# Patient Record
Sex: Female | Born: 1950
Health system: Southern US, Community
[De-identification: ages and names within clinical notes are randomized; demographics above are authoritative.]

## PROBLEM LIST (undated history)

## (undated) DIAGNOSIS — I428 Other cardiomyopathies: Secondary | ICD-10-CM

## (undated) DIAGNOSIS — I10 Essential (primary) hypertension: Secondary | ICD-10-CM

## (undated) DIAGNOSIS — I509 Heart failure, unspecified: Secondary | ICD-10-CM

## (undated) DIAGNOSIS — G8929 Other chronic pain: Secondary | ICD-10-CM

## (undated) DIAGNOSIS — R0602 Shortness of breath: Secondary | ICD-10-CM

## (undated) DIAGNOSIS — M1990 Unspecified osteoarthritis, unspecified site: Secondary | ICD-10-CM

## (undated) DIAGNOSIS — M549 Dorsalgia, unspecified: Secondary | ICD-10-CM

## (undated) DIAGNOSIS — M2550 Pain in unspecified joint: Secondary | ICD-10-CM

## (undated) DIAGNOSIS — E785 Hyperlipidemia, unspecified: Secondary | ICD-10-CM

## (undated) DIAGNOSIS — M2540 Effusion, unspecified joint: Secondary | ICD-10-CM

## (undated) DIAGNOSIS — E119 Type 2 diabetes mellitus without complications: Secondary | ICD-10-CM

## (undated) DIAGNOSIS — E876 Hypokalemia: Secondary | ICD-10-CM

## (undated) HISTORY — PX: OTHER SURGICAL HISTORY: SHX169

## (undated) HISTORY — DX: Other cardiomyopathies: I42.8

## (undated) HISTORY — PX: CHOLECYSTECTOMY: SHX55

## (undated) HISTORY — PX: JOINT REPLACEMENT: SHX530

---

## 2008-09-15 ENCOUNTER — Inpatient Hospital Stay (HOSPITAL_COMMUNITY): Admission: RE | Admit: 2008-09-15 | Discharge: 2008-09-18 | Payer: Self-pay | Admitting: Orthopedic Surgery

## 2008-10-03 ENCOUNTER — Encounter: Admission: RE | Admit: 2008-10-03 | Discharge: 2009-01-01 | Payer: Self-pay | Admitting: Orthopedic Surgery

## 2010-12-17 LAB — URINALYSIS, ROUTINE W REFLEX MICROSCOPIC
Bilirubin Urine: NEGATIVE
Glucose, UA: NEGATIVE mg/dL
Ketones, ur: NEGATIVE mg/dL
Nitrite: NEGATIVE
Specific Gravity, Urine: 1.018 (ref 1.005–1.030)
pH: 5.5 (ref 5.0–8.0)

## 2010-12-17 LAB — DIFFERENTIAL
Basophils Absolute: 0 10*3/uL (ref 0.0–0.1)
Basophils Relative: 0 % (ref 0–1)
Eosinophils Absolute: 0.1 10*3/uL (ref 0.0–0.7)
Eosinophils Relative: 1 % (ref 0–5)
Monocytes Absolute: 0.7 10*3/uL (ref 0.1–1.0)
Monocytes Relative: 8 % (ref 3–12)
Neutro Abs: 4.7 10*3/uL (ref 1.7–7.7)

## 2010-12-17 LAB — CBC
HCT: 30.8 % — ABNORMAL LOW (ref 36.0–46.0)
HCT: 41 % (ref 36.0–46.0)
HCT: 25 % — ABNORMAL LOW (ref 36.0–46.0)
HCT: 29.5 % — ABNORMAL LOW (ref 36.0–46.0)
Hemoglobin: 13.8 g/dL (ref 12.0–15.0)
MCHC: 33.2 g/dL (ref 30.0–36.0)
MCV: 88.8 fL (ref 78.0–100.0)
MCV: 89.1 fL (ref 78.0–100.0)
MCV: 90 fL (ref 78.0–100.0)
Platelets: 197 10*3/uL (ref 150–400)
Platelets: 216 10*3/uL (ref 150–400)
Platelets: 176 10*3/uL (ref 150–400)
Platelets: 190 10*3/uL (ref 150–400)
RBC: 3.47 MIL/uL — ABNORMAL LOW (ref 3.87–5.11)
RBC: 3.27 MIL/uL — ABNORMAL LOW (ref 3.87–5.11)
RDW: 14.1 % (ref 11.5–15.5)
WBC: 12.3 10*3/uL — ABNORMAL HIGH (ref 4.0–10.5)
WBC: 8.2 10*3/uL (ref 4.0–10.5)
WBC: 12 10*3/uL — ABNORMAL HIGH (ref 4.0–10.5)

## 2010-12-17 LAB — COMPREHENSIVE METABOLIC PANEL
ALT: 30 U/L (ref 0–35)
Albumin: 4 g/dL (ref 3.5–5.2)
Alkaline Phosphatase: 101 U/L (ref 39–117)
BUN: 16 mg/dL (ref 6–23)
Chloride: 107 mEq/L (ref 96–112)
Potassium: 3.7 mEq/L (ref 3.5–5.1)
Sodium: 143 mEq/L (ref 135–145)
Total Bilirubin: 0.5 mg/dL (ref 0.3–1.2)
Total Protein: 7.5 g/dL (ref 6.0–8.3)

## 2010-12-17 LAB — PREGNANCY, URINE: Preg Test, Ur: NEGATIVE

## 2010-12-17 LAB — BASIC METABOLIC PANEL
BUN: 12 mg/dL (ref 6–23)
BUN: 15 mg/dL (ref 6–23)
CO2: 27 mEq/L (ref 19–32)
Chloride: 104 mEq/L (ref 96–112)
Chloride: 105 mEq/L (ref 96–112)
GFR calc Af Amer: >60 mL/min (ref >60)
GFR calc non Af Amer: >60 mL/min (ref >60)
Potassium: 4 mEq/L (ref 3.5–5.1)
Potassium: 3.7 mEq/L (ref 3.5–5.1)
Sodium: 138 mEq/L (ref 135–145)

## 2010-12-17 LAB — URINE MICROSCOPIC-ADD ON

## 2010-12-17 LAB — CROSSMATCH

## 2010-12-17 LAB — APTT: aPTT: 33 seconds (ref 24–37)

## 2010-12-17 LAB — ABO/RH: ABO/RH(D): O POS

## 2011-01-15 NOTE — Discharge Summary (Signed)
Sharon [name], Sharon [name]                ACCOUNT NO.:  [id number]   MEDICAL RECORD NO.:  [id number]          PATIENT TYPE:  INP   LOCATION:  1607                         FACILITY:  [facility]   PHYSICIAN:  John L. Rendall, M.D.  DATE OF BIRTH:  Dec 31, 1950   DATE OF ADMISSION:  09/15/2008  DATE OF DISCHARGE:  09/18/2008                               DISCHARGE SUMMARY   ADMISSION DIAGNOSES:  1. End-stage osteoarthritis left knee.  2. Obesity.  3. Hypertension.  4. Osteoporosis.  5. Chronic bronchitis.   DISCHARGE DIAGNOSES:  1. End-stage osteoarthritis left knee status post left total knee      arthroplasty.  2. Acute blood loss anemia secondary to surgery.  3. Hyperglycemia.  4. Obesity.  5. Hypertension.  6. Osteoporosis.  7. Chronic bronchitis.   SURGICAL PROCEDURES:  On September 15, 2008 Sharon [name] underwent a left  total knee arthroplasty with computer navigation by Dr. Jonny Ruiz L.  Rendall  assisted by Arnoldo Morale PA-C.  She had a DePuy NBT keel tibial tray  cemented size three placed with an LPS complete primary femoral  component standard plus left.  An LPS complete RP insert size standard  plus 10-mm thickness and LPS complete metal back patella size standard  plus.   COMPLICATIONS:  None.   CONSULTANT:  1. Physical therapy consult September 16, 2008.  2. Occupational therapy consult September 17, 2008.  3. Case management consult September 18, 2008.   HISTORY OF PRESENT ILLNESS:  This 60 year old black female patient  presented to Dr. Priscille Kluver with a 5-10-year history of gradual onset  progressive left knee pain over the last year.  She does have a history  two knee scopes in the past, but the pain now is a constant sharp  jabbing ache over the anterior joint line with radiation into the thigh.  It increases with range of motion and walking and decreases with rest.  The knee pops, grinds and gives.  She has failed conservative treatment  and x-rays show end-stage arthritic changes  of the knee.  Because of  this, she is presenting for left knee replacement.   HOSPITAL COURSE:  Sharon [name] tolerated her surgical procedure well  without immediate postoperative complications.  She was transferred to  the orthopedic floor.  Postop day #1, T-max 98.6, BP a bit low at 97/64.  Hemoglobin was 10.1, hematocrit 30.8, white count elevated at 12.3.  Dressing was intact to the left leg with a Hemovac in place.  Pain was  controlled with medications.  She was weaned off her oxygen and started  on therapy per protocol.   Postop day #2.  T-max 98.4, BP improved to 116/75.  White count 12,  hemoglobin 9.8, hematocrit 29.5.  Dressing was changed.  She was  switched to p.o. pain medications and continued on therapy.   Postop day #3, she was feeling better.  Hemoglobin had dropped to 8.3  with hematocrit of 25 but she was asymptomatic.  White count dropped to  9.9, BP was 127/65, pulse was a bit elevated at 110.  Leg was  neurovascularly intact.  She was doing well with therapy and it was felt  she was ready for discharge home and she was discharged home at that  time.   DISCHARGE INSTRUCTIONS:  1. Diet:  She is to resume her regular prehospitalization diet.  2. Activity:  She can be out of bed, weightbearing as tolerated on the      left leg with use of walker.  She is to increase activity slowly.      No lifting or driving for 6 weeks.  She is to have home CPM zero to      90 degrees 6-8 hours a day and home health PT per [facility].  Please see the blue total knee discharge sheet for      further activity instructions.  3. Wound care:  She may shower after no drainage from the wound for 2      days.  Please see the blue total knee discharge sheet for further      wound care instructions.  4. Follow-up:  She is to follow up with Dr. Priscille Kluver in our office on      Tuesday January 26 and needs to call [phone] for that appointment.   MEDICATIONS:  She is to  resume her home medications as follows.  1. Estradiol 0.5 mg p.o. q.a.m.  2. Ramipril 10 mg p.o. q.a.m.  3. Triamterene/hydrochlorothiazide 37.5/25 mg p.o. q.a.m.  4. Diltiazem CD 300 mg p.o. q.a.m.  5. Fosamax 70 mg p.o. q. Monday.  6. Hydrocodone, she is to hold at this time while she is on other pain      medications.  7. Vitamin D 1000 units p.o. q.a.m.  8. Vitamin C 500 mg p.o. b.i.d.  9. Beta-carotene 15 mg p.o. q.a.m.  10.Ibuprofen p.m.  She is to hold at this time while on blood thinner.  11.Vitamin E one p.o. q.a.m.  12.ProAir inhaler two puffs p.r.n.   ADDITIONAL MEDICATIONS AT THIS TIME:  1. Arixtra 2.5 mg subcu q. 8 p.m. with last dose on January 23.  On      January 24, she is to start one baby aspirin a day for one month.  2. Celebrex 200 mg one tablet p.o. b.i.d. for one week and then one      tablet p.o. daily.  Samples were given.  3. Percocet 5/325 1-2 tablets p.o. q.4 h p.r.n. for pain, 60 with no      refill.  4. Robaxin 500 mg 1-2 tablet p.o. q.6 h p.r.n. for spasms 60 with no      refill.   LABORATORY DATA:  Hemoglobin/hematocrit ranged from 13.8 an 41 on the  11th to 8.3 and 25 on the 17th.  White count went from 8.2 on the 11th  to 12.3 on the 15th to 9.9 on the 17th.  Platelets were within normal  limits.   Glucose ranged from 91 on the 11th to 146 on the 16th.  Calcium ranged  from 9.4 on the 11th to 7.8 on the 16th.   Urinalysis on the 11th showed trace hemoglobin, few epithelials, 0-2 red  cells.  All other laboratory studies were within normal limits.      Legrand Pitts Duffy, P.A.      John L. Rendall, M.D.  Electronically Signed    KED/MEDQ  D:  10/20/2008  T:  10/20/2008  Job:  [id number]

## 2011-01-15 NOTE — Op Note (Signed)
[name], [name]                ACCOUNT NO.:  [id number]   MEDICAL RECORD NO.:  [id number]          PATIENT TYPE:  INP   LOCATION:  0002                         FACILITY:  [facility]   PHYSICIAN:  John L. Rendall, M.D.  DATE OF BIRTH:  15-Jun-1951   DATE OF PROCEDURE:  09/15/2008  DATE OF DISCHARGE:                               OPERATIVE REPORT   PREOPERATIVE DIAGNOSIS:  Osteoarthritis, left knee.   SURGICAL PROCEDURES:  Left LCS total knee replacement with computer  navigation assistance.   POSTOPERATIVE DIAGNOSIS:  Osteoarthritis, left knee.   SURGEON:  John L. Rendall, M.D.   ASSISTANT:  Legrand Pitts. Duffy, P.A.   ANESTHESIA:  General with a femoral nerve block.   PATHOLOGY:  Bone against bone, left knee with unremitting pain despite  conservative measures.  Besides tricompartmental arthritis,  there is  found to be about a 5 cm x 5 cm x 3-4 cm lipoma and the suprapatellar  pouch.  It blocks flexion of the knee and upon its removal, the knee  bent past 90 degrees easily but up until then, it would not bend past 90  degrees.   PROCEDURE:  Under general anesthesia the left leg was prepared with  DuraPrep and draped as a sterile field.  A sterile tourniquet was placed  in the upper thigh.  Leg was wrapped out with the Esmarch and a  tourniquet was used at 350 mm.  Midline incision was made.  The patella  was everted.  The femur was sized at a standard plus.  Debridement was  done in preparation for computer mapping.  Once this was completed,  Schanz pins were placed in the superior medial tibia through accessory  stab wounds and in the distal medial femur.  With these in place the  arrays were set up, the femoral head was identified, medial and lateral  malleoli are identified and proximal and distal tibia and femur  respectively are mapped.  Once this was completed within 1 mm of  reproducible accuracy, the first guide is used for proximal tibial  resection.  This was done within  1 degree of anatomic alignment.  The  tensioner is then used and tensioning is done to within 2 degrees of  valgus.  The knee started off in about 4-5 degrees of valgus.  The  flexion gap was then measured.  The anterior and posterior flare of the  distal femur are then resected for a standard plus.  The distal femoral  cut was likewise made.  Flexion and extension gaps were balanced at 10  degrees and the tightness laterally is released better what with further  debridement.  Recessing guide is then used.  Proximal tibia was exposed.  It sized to a #3. Center peg hole with keel was used and with this in  place, a standard plus 10 bearing is inserted and the standard plus  femoral component.  With these in place and properly aligned, the knee  is within 1-1/2 degree of anatomic alignment.  It is within 1 degree of  full extension where as it had been 10  degrees flexed.  In debriding the  knee, a large lipoma was removed from the suprapatellar pouch.  With it  gone, the knee would now flex to about 105 degrees relatively easily.  At this point permanent components were obtained and the arrays were  taken down. Bony surfaces were prepared with pulse irrigation.  All  components were cemented in place.  Tourniquet is let down at an hour  and 10 minutes after excess cement was removed.  The knee was then  closed in layers with #1 Tycron, #1 Vicryl, 2-0 Vicryl and skin clips.  Total operative time about an hour and 30 minutes.  The patient  tolerated the procedure well and returned to recovery in good condition.  Blood loss less than 100.      John L. Rendall, M.D.  Electronically Signed     JLR/MEDQ  D:  09/15/2008  T:  09/15/2008  Job:  [id number]

## 2011-10-30 ENCOUNTER — Encounter (HOSPITAL_BASED_OUTPATIENT_CLINIC_OR_DEPARTMENT_OTHER): Payer: Self-pay | Admitting: Family Medicine

## 2011-10-30 ENCOUNTER — Emergency Department (HOSPITAL_BASED_OUTPATIENT_CLINIC_OR_DEPARTMENT_OTHER)
Admission: EM | Admit: 2011-10-30 | Discharge: 2011-10-30 | Disposition: A | Discharge status: Home / Self Care | Payer: BC Managed Care – PPO | Attending: Emergency Medicine | Admitting: Emergency Medicine

## 2011-10-30 DIAGNOSIS — I10 Essential (primary) hypertension: Secondary | ICD-10-CM | POA: Insufficient documentation

## 2011-10-30 DIAGNOSIS — L509 Urticaria, unspecified: Secondary | ICD-10-CM | POA: Insufficient documentation

## 2011-10-30 HISTORY — DX: Essential (primary) hypertension: I10

## 2011-10-30 MED ORDER — PREDNISONE 50 MG PO TABS: ORAL_TABLET | ORAL | Status: AC

## 2011-10-30 MED ORDER — EPINEPHRINE 0.3 MG/0.3ML IJ DEVI: 0.3000 mg | INTRAMUSCULAR | Status: DC | PRN

## 2011-10-30 MED ORDER — DEXAMETHASONE SODIUM PHOSPHATE 10 MG/ML IJ SOLN
10.0000 mg | Freq: Once | INTRAMUSCULAR | Status: AC
  Administered 2011-10-30: 10 mg via INTRAMUSCULAR
  Filled 2011-10-30: qty 1

## 2011-10-30 NOTE — ED Notes (Signed)
Pt c/o urticaria to neck, upper arms, face and back since yesterday. Pt reports eating and handling a "new fruit and vegetable at school yesterday".

## 2011-10-30 NOTE — ED Provider Notes (Signed)
History     CSN: [id number]  Arrival date & time 10/30/11  1533   First MD Initiated Contact with Patient 10/30/11 1615      Chief Complaint  Patient presents with  . Rash     Patient is a 61 y.o. female presenting with rash. The history is provided by the patient.  Rash  This is a new problem. The current episode started yesterday. The problem has been gradually worsening. Associated with: fruit. There has been no fever. Affected Location: back/extremities. The pain is moderate. The pain has been constant since onset. Associated symptoms include itching. The treatment provided mild relief.  pt denies shortness of breath No facial/tongue swelling  She has never had allergic reactions previously  Past Medical History  Diagnosis Date  . Hypertension   . Bronchitis     Past Surgical History  Procedure Date  . Joint replacement   . Carpel tunnel   . Cholecystectomy     No family history on file.  History  Substance Use Topics  . Smoking status: Never Smoker   . Smokeless tobacco: Not on file  . Alcohol Use: No    OB History    Grav Para Term Preterm Abortions TAB SAB Ect Mult Living                  Review of Systems  Constitutional: Negative for fever.  Skin: Positive for itching and rash.    Allergies  Review of patient's allergies indicates not on file.  Home Medications   Current Outpatient Rx  Name Route Sig Dispense Refill  . ALENDRONATE SODIUM 70 MG PO TABS Oral Take 70 mg by mouth every 7 (seven) days. Take with a full glass of water on an empty stomach.    Marland Kitchen DILTIAZEM HCL ER COATED BEADS 300 MG PO CP24 Oral Take 300 mg by mouth daily.    Marland Kitchen DIPHENHYDRAMINE HCL (SLEEP) 25 MG PO TABS Oral Take 25 mg by mouth as needed. For allergic reaction    . METOPROLOL SUCCINATE ER 50 MG PO TB24 Oral Take 25 mg by mouth 2 (two) times daily. Take with or immediately following a meal.    . ADULT MULTIVITAMIN W/MINERALS CH Oral Take 1 tablet by mouth daily.    Marland Kitchen  RAMIPRIL 10 MG PO CAPS Oral Take 10 mg by mouth daily.    Marland Kitchen ROSUVASTATIN CALCIUM 40 MG PO TABS Oral Take 40 mg by mouth daily.    . TRIAMTERENE-HCTZ 37.5-25 MG PO TABS Oral Take 1 tablet by mouth daily.    Marland Kitchen EPINEPHRINE 0.3 MG/0.3ML IJ DEVI Intramuscular Inject 0.3 mLs (0.3 mg total) into the muscle as needed. 2 Device 0  . PREDNISONE 50 MG PO TABS  One tablet PO daily 5 tablet 0    BP 171/96  Pulse 88  Temp(Src) 98.6 F (37 C) (Oral)  Resp 16  Ht 5\' 7"  (1.702 m)  Wt 260 lb (117.935 kg)  BMI 40.72 kg/m2  SpO2 100%  Physical Exam CONSTITUTIONAL: Well developed/well nourished HEAD AND FACE: Normocephalic/atraumatic EYES: EOMI/PERRL ENMT: Mucous membranes moist, no angioedema noted NECK: supple no meningeal signs SPINE:entire spine nontender CV: S1/S2 noted, no murmurs/rubs/gallops noted LUNGS: Lungs are clear to auscultation bilaterally, no apparent distress ABDOMEN: soft, nontender, no rebound or guarding NEURO: Pt is awake/alert, moves all extremitiesx4 EXTREMITIES: pulses normal, full ROM SKIN: warm, urticaria noted to back/extremities PSYCH: no abnormalities of mood noted  ED Course  Procedures    1. Urticaria  Pt well appearing, no distress This has been present since yesterday Will start on steroids.  Also, I did prescribe an epipen for this patient and advised on use if symptoms return   MDM  Nursing notes reviewed and considered in documentation         Joya Gaskins, MD 10/30/11 1736

## 2011-10-30 NOTE — Discharge Instructions (Signed)

## 2013-02-05 ENCOUNTER — Encounter (HOSPITAL_COMMUNITY): Payer: Self-pay | Admitting: Pharmacy Technician

## 2013-02-06 DIAGNOSIS — M79609 Pain in unspecified limb: Secondary | ICD-10-CM | POA: Insufficient documentation

## 2013-02-06 DIAGNOSIS — M545 Low back pain, unspecified: Secondary | ICD-10-CM | POA: Insufficient documentation

## 2013-02-06 DIAGNOSIS — M810 Age-related osteoporosis without current pathological fracture: Secondary | ICD-10-CM | POA: Insufficient documentation

## 2013-02-06 DIAGNOSIS — M48061 Spinal stenosis, lumbar region without neurogenic claudication: Secondary | ICD-10-CM | POA: Insufficient documentation

## 2013-02-06 DIAGNOSIS — E7800 Pure hypercholesterolemia, unspecified: Secondary | ICD-10-CM | POA: Insufficient documentation

## 2013-02-06 DIAGNOSIS — N951 Menopausal and female climacteric states: Secondary | ICD-10-CM | POA: Insufficient documentation

## 2013-02-06 DIAGNOSIS — E669 Obesity, unspecified: Secondary | ICD-10-CM | POA: Insufficient documentation

## 2013-02-06 DIAGNOSIS — E6609 Other obesity due to excess calories: Secondary | ICD-10-CM | POA: Insufficient documentation

## 2013-02-06 DIAGNOSIS — M5430 Sciatica, unspecified side: Secondary | ICD-10-CM | POA: Insufficient documentation

## 2013-02-10 ENCOUNTER — Ambulatory Visit (HOSPITAL_COMMUNITY)
Admission: RE | Admit: 2013-02-10 | Discharge: 2013-02-10 | Disposition: A | Discharge status: Home / Self Care | Payer: BC Managed Care – PPO | Source: Ambulatory Visit | Attending: Physician Assistant | Admitting: Physician Assistant

## 2013-02-10 ENCOUNTER — Other Ambulatory Visit: Payer: Self-pay | Admitting: Physician Assistant

## 2013-02-10 ENCOUNTER — Encounter (HOSPITAL_COMMUNITY)
Admission: RE | Admit: 2013-02-10 | Discharge: 2013-02-10 | Disposition: A | Discharge status: Home / Self Care | Payer: BC Managed Care – PPO | Source: Ambulatory Visit | Attending: Orthopedic Surgery | Admitting: Orthopedic Surgery

## 2013-02-10 ENCOUNTER — Encounter (HOSPITAL_COMMUNITY): Payer: Self-pay

## 2013-02-10 DIAGNOSIS — Z01818 Encounter for other preprocedural examination: Secondary | ICD-10-CM | POA: Insufficient documentation

## 2013-02-10 DIAGNOSIS — M1710 Unilateral primary osteoarthritis, unspecified knee: Secondary | ICD-10-CM | POA: Insufficient documentation

## 2013-02-10 DIAGNOSIS — Z01812 Encounter for preprocedural laboratory examination: Secondary | ICD-10-CM | POA: Insufficient documentation

## 2013-02-10 DIAGNOSIS — IMO0002 Reserved for concepts with insufficient information to code with codable children: Secondary | ICD-10-CM | POA: Insufficient documentation

## 2013-02-10 HISTORY — DX: Pain in unspecified joint: M25.50

## 2013-02-10 HISTORY — DX: Unspecified osteoarthritis, unspecified site: M19.90

## 2013-02-10 HISTORY — DX: Hypokalemia: E87.6

## 2013-02-10 HISTORY — DX: Effusion, unspecified joint: M25.40

## 2013-02-10 HISTORY — DX: Hyperlipidemia, unspecified: E78.5

## 2013-02-10 HISTORY — DX: Shortness of breath: R06.02

## 2013-02-10 HISTORY — DX: Dorsalgia, unspecified: M54.9

## 2013-02-10 LAB — CBC WITH DIFFERENTIAL/PLATELET
Basophils Absolute: 0 10*3/uL (ref 0.0–0.1)
Eosinophils Absolute: 0.2 10*3/uL (ref 0.0–0.7)
Eosinophils Relative: 2 % (ref 0–5)
Lymphocytes Relative: 35 % (ref 12–46)
Lymphs Abs: 3.2 10*3/uL (ref 0.7–4.0)
Neutrophils Relative %: 56 % (ref 43–77)
Platelets: 206 10*3/uL (ref 150–400)
RBC: 4.92 MIL/uL (ref 3.87–5.11)
RDW: 14.1 % (ref 11.5–15.5)
WBC: 9.2 10*3/uL (ref 4.0–10.5)

## 2013-02-10 LAB — URINALYSIS, ROUTINE W REFLEX MICROSCOPIC
Leukocytes, UA: NEGATIVE
Nitrite: NEGATIVE
Protein, ur: 30 mg/dL — AB
Specific Gravity, Urine: 1.017 (ref 1.005–1.030)
Urobilinogen, UA: 0.2 mg/dL (ref 0.0–1.0)

## 2013-02-10 LAB — COMPREHENSIVE METABOLIC PANEL
ALT: 22 U/L (ref 0–35)
AST: 16 U/L (ref 0–37)
Alkaline Phosphatase: 92 U/L (ref 39–117)
CO2: 28 mEq/L (ref 19–32)
Calcium: 9.3 mg/dL (ref 8.4–10.5)
GFR calc Af Amer: 72 mL/min — ABNORMAL LOW (ref >90)
Glucose, Bld: 97 mg/dL (ref 70–99)
Potassium: 3.8 mEq/L (ref 3.5–5.1)
Sodium: 143 mEq/L (ref 135–145)
Total Protein: 8.1 g/dL (ref 6.0–8.3)

## 2013-02-10 LAB — URINE MICROSCOPIC-ADD ON

## 2013-02-10 LAB — SURGICAL PCR SCREEN: Staphylococcus aureus: NEGATIVE

## 2013-02-10 LAB — APTT: aPTT: 33 seconds (ref 24–37)

## 2013-02-10 LAB — TYPE AND SCREEN
ABO/RH(D): O POS
Antibody Screen: NEGATIVE

## 2013-02-10 MED ORDER — CHLORHEXIDINE GLUCONATE 4 % EX LIQD: 60.0000 mL | Freq: Once | CUTANEOUS | Status: DC

## 2013-02-10 NOTE — Progress Notes (Signed)
Pt doesn't have a cardiologist  Echo was done about 2 yrs ago with Dr.Chun with [facility] request report  Stress test was done about 89yrs ago and to request report  Denies ever having a heart cath  Dr.William Ammen in [place] is Medical Md  EKG report in chart from 01-18-13  Denies CXR in past yr  Clearance note in chart

## 2013-02-10 NOTE — Pre-Procedure Instructions (Signed)
Sharon Yu  02/10/2013   Your procedure is scheduled on:  Fri, June 20 @ 7:30 AM  Report to [facility] at 5:30 AM.  Call this number if you have problems the morning of surgery: [phone]   Remember:   Do not eat food or drink liquids after midnight.   Take these medicines the morning of surgery with A SIP OF WATER: Metoprolol(Lopressor)   Do not wear jewelry, make-up or nail polish.  Do not wear lotions, powders, or perfumes. You may wear deodorant.  Do not shave 48 hours prior to surgery.   Do not bring valuables to the hospital.  [facility] is not responsible                   for any belongings or valuables.  Contacts, dentures or bridgework may not be worn into surgery.  Leave suitcase in the car. After surgery it may be brought to your room.  For patients admitted to the hospital, checkout time is 11:00 AM the day of  discharge.   Patients discharged the day of surgery will not be allowed to drive  home.    Special Instructions: Shower using CHG 2 nights before surgery and the night before surgery.  If you shower the day of surgery use CHG.  Use special wash - you have one bottle of CHG for all showers.  You should use approximately 1/3 of the bottle for each shower.   Please read over the following fact sheets that you were given: Pain Booklet, Coughing and Deep Breathing, Blood Transfusion Information, Total Joint Packet, MRSA Information and Surgical Site Infection Prevention

## 2013-02-11 LAB — URINE CULTURE

## 2013-02-15 NOTE — Progress Notes (Signed)
Requested last OV, Stress, echo from [facility]

## 2013-02-18 MED ORDER — CEFAZOLIN SODIUM-DEXTROSE 2-3 GM-% IV SOLR
2.0000 g | INTRAVENOUS | Status: AC
  Administered 2013-02-19: 2 g via INTRAVENOUS
  Filled 2013-02-18: qty 50

## 2013-02-18 NOTE — H&P (Signed)
TOTAL KNEE ADMISSION H&P  Patient is being admitted for right total knee arthroplasty.  Subjective:  Chief Complaint:right knee pain.  HPI: Sharon Yu, 62 y.o. female, has a history of pain and functional disability in the right knee due to arthritis and has failed non-surgical conservative treatments for greater than 12 weeks to includeNSAID's and/or analgesics, corticosteriod injections, use of assistive devices and activity modification.  Onset of symptoms was gradual, starting >10 years ago with gradually worsening course since that time. The patient noted prior procedures on the knee to include  arthroscopy and menisectomy on the right knee(s).  Patient currently rates pain in the right knee(s) at 8 out of 10 with activity. Patient has night pain, worsening of pain with activity and weight bearing, pain that interferes with activities of daily living, pain with passive range of motion, crepitus and joint swelling.  Patient has evidence of periarticular osteophytes and joint space narrowing by imaging studies.There is no active infection.  There are no active problems to display for this patient.  Past Medical History  Diagnosis Date  . Hypertension     takes Maxzide and Metoprolol daily along with Ramipril  . Hyperlipidemia     takes Crestor daily  . Shortness of breath     with exertion  . Arthritis   . Joint pain   . Joint swelling   . Back pain     buldging disc  . Hypopotassemia     takes Potassium pill daily    Past Surgical History  Procedure Laterality Date  . Carpel tunnel      bilateral  . Cholecystectomy    . Bilateral knee arthroscopies    . Joint replacement Left   . Colonscopy       (Not in a hospital admission) Allergies  Allergen Reactions  . Other Hives and Swelling    "Grapple" (hybrid apple & grape)    History  Substance Use Topics  . Smoking status: Never Smoker   . Smokeless tobacco: Not on file  . Alcohol Use: No    No family history  on file.   Review of Systems  Constitutional: Negative.   HENT: Negative.   Eyes: Negative.   Respiratory: Positive for shortness of breath. Negative for cough, hemoptysis, sputum production and wheezing.   Cardiovascular: Positive for leg swelling. Negative for chest pain, palpitations, orthopnea and claudication.  Gastrointestinal: Negative.   Genitourinary: Negative.   Musculoskeletal: Positive for joint pain. Negative for falls.  Skin: Negative.   Neurological: Negative.   Endo/Heme/Allergies: Negative.   Psychiatric/Behavioral: Negative.     Objective:  Physical Exam  Constitutional: She is oriented to person, place, and time. She appears well-developed and well-nourished. No distress.  HENT:  Head: Normocephalic and atraumatic.  Nose: Nose normal.  Eyes: Conjunctivae and EOM are normal. Pupils are equal, round, and reactive to light.  Neck: Normal range of motion. Neck supple.  Cardiovascular: Normal rate, regular rhythm, normal heart sounds and intact distal pulses.   No murmur heard. Respiratory: Effort normal and breath sounds normal. No respiratory distress. She has no wheezes. She exhibits no tenderness.  GI: Soft. Bowel sounds are normal. She exhibits no distension. There is no tenderness.  Musculoskeletal:       Right knee: She exhibits decreased range of motion, swelling, effusion and deformity. She exhibits no ecchymosis, no erythema, no LCL laxity and no MCL laxity. Tenderness found.  Lymphadenopathy:    She has no cervical adenopathy.  Neurological: She is  alert and oriented to person, place, and time. No cranial nerve deficit.  Skin: Skin is warm and dry. No rash noted. No erythema. No pallor.  Psychiatric: She has a normal mood and affect. Her behavior is normal.    Vital signs in last 24 hours: @VSRANGES @  Labs:   Estimated body mass index is 40.38 kg/(m^2) as calculated from the following:   Height as of 10/30/11: 5\' 7"  (1.702 m).   Weight as of an  earlier encounter on 02/10/13: 116.983 kg (257 lb 14.4 oz).   Imaging Review Plain radiographs demonstrate severe degenerative joint disease of the right knee(s). The overall alignment issignificant varus. The bone quality appears to be good for age and reported activity level.  Assessment/Plan:  End stage arthritis, right knee   The patient history, physical examination, clinical judgment of the provider and imaging studies are consistent with end stage degenerative joint disease of the right knee(s) and total knee arthroplasty is deemed medically necessary. The treatment options including medical management, injection therapy arthroscopy and arthroplasty were discussed at length. The risks and benefits of total knee arthroplasty were presented and reviewed. The risks due to aseptic loosening, infection, stiffness, patella tracking problems, thromboembolic complications and other imponderables were discussed. The patient acknowledged the explanation, agreed to proceed with the plan and consent was signed. Patient is being admitted for inpatient treatment for surgery, pain control, PT, OT, prophylactic antibiotics, VTE prophylaxis, progressive ambulation and ADL's and discharge planning. The patient is planning to be discharged home with home health services

## 2013-02-19 ENCOUNTER — Encounter (HOSPITAL_COMMUNITY)
Admission: RE | Disposition: A | Discharge status: Home Health | Payer: Self-pay | Source: Ambulatory Visit | Attending: Orthopedic Surgery

## 2013-02-19 ENCOUNTER — Inpatient Hospital Stay (HOSPITAL_COMMUNITY)
Admission: RE | Admit: 2013-02-19 | Discharge: 2013-02-22 | DRG: 209 | Disposition: A | Discharge status: Home Health | Payer: BC Managed Care – PPO | Source: Ambulatory Visit | Attending: Orthopedic Surgery | Admitting: Orthopedic Surgery

## 2013-02-19 ENCOUNTER — Encounter (HOSPITAL_COMMUNITY): Payer: Self-pay | Admitting: Anesthesiology

## 2013-02-19 ENCOUNTER — Inpatient Hospital Stay (HOSPITAL_COMMUNITY): Payer: BC Managed Care – PPO | Admitting: Anesthesiology

## 2013-02-19 ENCOUNTER — Encounter (HOSPITAL_COMMUNITY): Payer: Self-pay | Admitting: *Deleted

## 2013-02-19 DIAGNOSIS — I10 Essential (primary) hypertension: Secondary | ICD-10-CM | POA: Diagnosis present

## 2013-02-19 DIAGNOSIS — E876 Hypokalemia: Secondary | ICD-10-CM | POA: Diagnosis present

## 2013-02-19 DIAGNOSIS — E785 Hyperlipidemia, unspecified: Secondary | ICD-10-CM | POA: Diagnosis present

## 2013-02-19 DIAGNOSIS — M1710 Unilateral primary osteoarthritis, unspecified knee: Principal | ICD-10-CM | POA: Diagnosis present

## 2013-02-19 HISTORY — PX: TOTAL KNEE ARTHROPLASTY: SHX125

## 2013-02-19 LAB — CBC
Hemoglobin: 12.2 g/dL (ref 12.0–15.0)
MCHC: 33.2 g/dL (ref 30.0–36.0)
RBC: 4.24 MIL/uL (ref 3.87–5.11)
WBC: 11.8 10*3/uL — ABNORMAL HIGH (ref 4.0–10.5)

## 2013-02-19 LAB — CREATININE, SERUM
Creatinine, Ser: 0.89 mg/dL (ref 0.50–1.10)
GFR calc Af Amer: 80 mL/min — ABNORMAL LOW (ref >90)
GFR calc non Af Amer: 69 mL/min — ABNORMAL LOW (ref >90)

## 2013-02-19 SURGERY — ARTHROPLASTY, KNEE, TOTAL
Anesthesia: General | Site: Knee | Laterality: Right | Wound class: Clean

## 2013-02-19 MED ORDER — METOCLOPRAMIDE HCL 10 MG PO TABS: 5.0000 mg | ORAL_TABLET | Freq: Three times a day (TID) | ORAL | Status: DC | PRN

## 2013-02-19 MED ORDER — DOCUSATE SODIUM 100 MG PO CAPS
100.0000 mg | ORAL_CAPSULE | Freq: Two times a day (BID) | ORAL | Status: DC
  Administered 2013-02-19 – 2013-02-22 (×7): 100 mg via ORAL
  Filled 2013-02-19 (×7): qty 1

## 2013-02-19 MED ORDER — BISACODYL 10 MG RE SUPP: 10.0000 mg | Freq: Every day | RECTAL | Status: DC | PRN

## 2013-02-19 MED ORDER — RAMIPRIL 10 MG PO CAPS
10.0000 mg | ORAL_CAPSULE | Freq: Every day | ORAL | Status: DC
  Administered 2013-02-21 – 2013-02-22 (×2): 10 mg via ORAL
  Filled 2013-02-19 (×3): qty 1

## 2013-02-19 MED ORDER — ATORVASTATIN CALCIUM 80 MG PO TABS
80.0000 mg | ORAL_TABLET | Freq: Every day | ORAL | Status: DC
  Administered 2013-02-19 – 2013-02-21 (×4): 80 mg via ORAL
  Filled 2013-02-19 (×5): qty 1

## 2013-02-19 MED ORDER — METHOCARBAMOL 500 MG PO TABS: 500.0000 mg | ORAL_TABLET | Freq: Four times a day (QID) | ORAL | Status: DC | PRN

## 2013-02-19 MED ORDER — ENOXAPARIN SODIUM 30 MG/0.3ML ~~LOC~~ SOLN
30.0000 mg | Freq: Two times a day (BID) | SUBCUTANEOUS | Status: DC
  Administered 2013-02-20 – 2013-02-22 (×5): 30 mg via SUBCUTANEOUS
  Filled 2013-02-19 (×7): qty 0.3

## 2013-02-19 MED ORDER — LIDOCAINE HCL (CARDIAC) 20 MG/ML IV SOLN
INTRAVENOUS | Status: DC | PRN
  Administered 2013-02-19: 100 mg via INTRAVENOUS

## 2013-02-19 MED ORDER — ENOXAPARIN SODIUM 30 MG/0.3ML ~~LOC~~ SOLN: 30.0000 mg | Freq: Two times a day (BID) | SUBCUTANEOUS | Status: DC

## 2013-02-19 MED ORDER — OXYCODONE HCL 5 MG PO TABS: 5.0000 mg | ORAL_TABLET | Freq: Once | ORAL | Status: DC | PRN

## 2013-02-19 MED ORDER — PROPOFOL 10 MG/ML IV BOLUS
INTRAVENOUS | Status: DC | PRN
  Administered 2013-02-19: 150 mg via INTRAVENOUS

## 2013-02-19 MED ORDER — HYDROMORPHONE HCL PF 1 MG/ML IJ SOLN
INTRAMUSCULAR | Status: AC
  Filled 2013-02-19: qty 1

## 2013-02-19 MED ORDER — OXYCODONE HCL 5 MG/5ML PO SOLN: 5.0000 mg | Freq: Once | ORAL | Status: DC | PRN

## 2013-02-19 MED ORDER — 0.9 % SODIUM CHLORIDE (POUR BTL) OPTIME
TOPICAL | Status: DC | PRN
  Administered 2013-02-19: 1000 mL

## 2013-02-19 MED ORDER — MENTHOL 3 MG MT LOZG: 1.0000 | LOZENGE | OROMUCOSAL | Status: DC | PRN

## 2013-02-19 MED ORDER — OXYCODONE HCL 5 MG PO TABS
ORAL_TABLET | ORAL | Status: AC
  Filled 2013-02-19: qty 2

## 2013-02-19 MED ORDER — SODIUM CHLORIDE 0.9 % IV SOLN
INTRAVENOUS | Status: DC
  Administered 2013-02-20 (×2): via INTRAVENOUS

## 2013-02-19 MED ORDER — FLEET ENEMA 7-19 GM/118ML RE ENEM: 1.0000 | ENEMA | Freq: Once | RECTAL | Status: AC | PRN

## 2013-02-19 MED ORDER — HYDROMORPHONE HCL PF 1 MG/ML IJ SOLN
0.2500 mg | INTRAMUSCULAR | Status: DC | PRN
  Administered 2013-02-19 (×2): 0.5 mg via INTRAVENOUS
  Administered 2013-02-19: 1 mg via INTRAVENOUS

## 2013-02-19 MED ORDER — METOCLOPRAMIDE HCL 5 MG/ML IJ SOLN: 5.0000 mg | Freq: Three times a day (TID) | INTRAMUSCULAR | Status: DC | PRN

## 2013-02-19 MED ORDER — MIDAZOLAM HCL 5 MG/5ML IJ SOLN
INTRAMUSCULAR | Status: DC | PRN
  Administered 2013-02-19: 2 mg via INTRAVENOUS

## 2013-02-19 MED ORDER — POTASSIUM 99 MG PO TABS: 99.0000 mg | ORAL_TABLET | Freq: Every day | ORAL | Status: DC

## 2013-02-19 MED ORDER — ADULT MULTIVITAMIN W/MINERALS CH
1.0000 | ORAL_TABLET | Freq: Every day | ORAL | Status: DC
  Administered 2013-02-19 – 2013-02-22 (×4): 1 via ORAL
  Filled 2013-02-19 (×4): qty 1

## 2013-02-19 MED ORDER — SODIUM CHLORIDE 0.9 % IR SOLN
Status: DC | PRN
  Administered 2013-02-19: 3000 mL

## 2013-02-19 MED ORDER — ONDANSETRON HCL 4 MG/2ML IJ SOLN: 4.0000 mg | Freq: Once | INTRAMUSCULAR | Status: DC | PRN

## 2013-02-19 MED ORDER — PHENOL 1.4 % MT LIQD: 1.0000 | OROMUCOSAL | Status: DC | PRN

## 2013-02-19 MED ORDER — ONDANSETRON HCL 4 MG/2ML IJ SOLN
INTRAMUSCULAR | Status: DC | PRN
  Administered 2013-02-19: 4 mg via INTRAVENOUS

## 2013-02-19 MED ORDER — TRIAMTERENE-HCTZ 37.5-25 MG PO TABS
1.0000 | ORAL_TABLET | Freq: Every day | ORAL | Status: DC
  Administered 2013-02-20 – 2013-02-22 (×3): 1 via ORAL
  Filled 2013-02-19 (×3): qty 1

## 2013-02-19 MED ORDER — ONDANSETRON HCL 4 MG/2ML IJ SOLN: 4.0000 mg | Freq: Four times a day (QID) | INTRAMUSCULAR | Status: DC | PRN

## 2013-02-19 MED ORDER — SENNOSIDES-DOCUSATE SODIUM 8.6-50 MG PO TABS: 1.0000 | ORAL_TABLET | Freq: Every evening | ORAL | Status: DC | PRN

## 2013-02-19 MED ORDER — METHOCARBAMOL 100 MG/ML IJ SOLN: 500.0000 mg | Freq: Four times a day (QID) | INTRAVENOUS | Status: DC | PRN

## 2013-02-19 MED ORDER — OXYCODONE HCL 5 MG PO TABS
5.0000 mg | ORAL_TABLET | ORAL | Status: DC | PRN
  Administered 2013-02-19 – 2013-02-21 (×16): 10 mg via ORAL
  Administered 2013-02-22: 5 mg via ORAL
  Filled 2013-02-19 (×7): qty 2
  Filled 2013-02-19: qty 1
  Filled 2013-02-19 (×8): qty 2

## 2013-02-19 MED ORDER — ACETAMINOPHEN 650 MG RE SUPP: 650.0000 mg | Freq: Four times a day (QID) | RECTAL | Status: DC | PRN

## 2013-02-19 MED ORDER — ONDANSETRON HCL 4 MG PO TABS: 4.0000 mg | ORAL_TABLET | Freq: Four times a day (QID) | ORAL | Status: DC | PRN

## 2013-02-19 MED ORDER — LACTATED RINGERS IV SOLN
INTRAVENOUS | Status: DC | PRN
  Administered 2013-02-19 (×2): via INTRAVENOUS

## 2013-02-19 MED ORDER — BUPIVACAINE-EPINEPHRINE PF 0.5-1:200000 % IJ SOLN
INTRAMUSCULAR | Status: DC | PRN
  Administered 2013-02-19: 30 mL

## 2013-02-19 MED ORDER — OXYCODONE-ACETAMINOPHEN 5-325 MG PO TABS: ORAL_TABLET | ORAL | Status: DC

## 2013-02-19 MED ORDER — FENTANYL CITRATE 0.05 MG/ML IJ SOLN
INTRAMUSCULAR | Status: DC | PRN
  Administered 2013-02-19 (×5): 50 ug via INTRAVENOUS

## 2013-02-19 MED ORDER — METHOCARBAMOL 500 MG PO TABS
500.0000 mg | ORAL_TABLET | Freq: Four times a day (QID) | ORAL | Status: DC | PRN
  Administered 2013-02-19 – 2013-02-22 (×8): 500 mg via ORAL
  Filled 2013-02-19 (×8): qty 1

## 2013-02-19 MED ORDER — METHOCARBAMOL 500 MG PO TABS
ORAL_TABLET | ORAL | Status: AC
  Filled 2013-02-19: qty 1

## 2013-02-19 MED ORDER — CEFAZOLIN SODIUM-DEXTROSE 2-3 GM-% IV SOLR
2.0000 g | Freq: Four times a day (QID) | INTRAVENOUS | Status: AC
  Administered 2013-02-19 (×2): 2 g via INTRAVENOUS
  Filled 2013-02-19 (×3): qty 50

## 2013-02-19 MED ORDER — HYDROMORPHONE HCL PF 1 MG/ML IJ SOLN
1.0000 mg | INTRAMUSCULAR | Status: DC | PRN
  Administered 2013-02-19 – 2013-02-22 (×5): 1 mg via INTRAVENOUS
  Filled 2013-02-19 (×6): qty 1

## 2013-02-19 MED ORDER — MEPERIDINE HCL 25 MG/ML IJ SOLN: 6.2500 mg | INTRAMUSCULAR | Status: DC | PRN

## 2013-02-19 MED ORDER — DIPHENHYDRAMINE HCL 12.5 MG/5ML PO ELIX: 12.5000 mg | ORAL_SOLUTION | ORAL | Status: DC | PRN

## 2013-02-19 MED ORDER — SODIUM CHLORIDE 0.9 % IV SOLN: INTRAVENOUS | Status: DC

## 2013-02-19 MED ORDER — ACETAMINOPHEN 325 MG PO TABS: 650.0000 mg | ORAL_TABLET | Freq: Four times a day (QID) | ORAL | Status: DC | PRN

## 2013-02-19 MED ORDER — METOPROLOL TARTRATE 25 MG PO TABS
25.0000 mg | ORAL_TABLET | Freq: Two times a day (BID) | ORAL | Status: DC
  Administered 2013-02-20 – 2013-02-22 (×5): 25 mg via ORAL
  Filled 2013-02-19 (×6): qty 1

## 2013-02-19 SURGICAL SUPPLY — 59 items
BANDAGE ELASTIC 4 VELCRO ST LF (GAUZE/BANDAGES/DRESSINGS) ×2 IMPLANT
BANDAGE ELASTIC 6 VELCRO ST LF (GAUZE/BANDAGES/DRESSINGS) ×4 IMPLANT
BANDAGE ESMARK 6X9 LF (GAUZE/BANDAGES/DRESSINGS) ×1 IMPLANT
BLADE SAGITTAL 25.0X1.19X90 (BLADE) ×2 IMPLANT
BLADE SAW SAG 90X13X1.27 (BLADE) ×2 IMPLANT
BNDG ESMARK 6X9 LF (GAUZE/BANDAGES/DRESSINGS) ×2
BOWL SMART MIX CTS (DISPOSABLE) ×2 IMPLANT
CAPT RP KNEE ×2 IMPLANT
CEMENT HV SMART SET (Cement) ×4 IMPLANT
CLOTH BEACON ORANGE TIMEOUT ST (SAFETY) ×2 IMPLANT
COVER SURGICAL LIGHT HANDLE (MISCELLANEOUS) ×2 IMPLANT
CUFF TOURNIQUET SINGLE 34IN LL (TOURNIQUET CUFF) ×2 IMPLANT
CUFF TOURNIQUET SINGLE 44IN (TOURNIQUET CUFF) IMPLANT
DRAPE INCISE IOBAN 66X45 STRL (DRAPES) ×2 IMPLANT
DRAPE ORTHO SPLIT 77X108 STRL (DRAPES) ×2
DRAPE SURG ORHT 6 SPLT 77X108 (DRAPES) ×2 IMPLANT
DRAPE U-SHAPE 47X51 STRL (DRAPES) ×2 IMPLANT
DRSG ADAPTIC 3X8 NADH LF (GAUZE/BANDAGES/DRESSINGS) ×2 IMPLANT
DRSG PAD ABDOMINAL 8X10 ST (GAUZE/BANDAGES/DRESSINGS) ×2 IMPLANT
DURAPREP 26ML APPLICATOR (WOUND CARE) ×2 IMPLANT
ELECT REM PT RETURN 9FT ADLT (ELECTROSURGICAL) ×2
ELECTRODE REM PT RTRN 9FT ADLT (ELECTROSURGICAL) ×1 IMPLANT
EVACUATOR 1/8 PVC DRAIN (DRAIN) ×2 IMPLANT
FACESHIELD LNG OPTICON STERILE (SAFETY) ×4 IMPLANT
FLOSEAL 10ML (HEMOSTASIS) IMPLANT
GLOVE BIOGEL PI IND STRL 8 (GLOVE) ×4 IMPLANT
GLOVE BIOGEL PI INDICATOR 8 (GLOVE) ×4
GLOVE ORTHO TXT STRL SZ7.5 (GLOVE) ×4 IMPLANT
GLOVE SURG ORTHO 8.0 STRL STRW (GLOVE) ×4 IMPLANT
GOWN PREVENTION PLUS XLARGE (GOWN DISPOSABLE) ×2 IMPLANT
GOWN PREVENTION PLUS XXLARGE (GOWN DISPOSABLE) ×2 IMPLANT
GOWN STRL NON-REIN LRG LVL3 (GOWN DISPOSABLE) ×4 IMPLANT
HANDPIECE INTERPULSE COAX TIP (DISPOSABLE) ×1
HOOD PEEL AWAY FACE SHEILD DIS (HOOD) ×2 IMPLANT
IMMOBILIZER KNEE 22 UNIV (SOFTGOODS) ×2 IMPLANT
KIT BASIN OR (CUSTOM PROCEDURE TRAY) ×2 IMPLANT
KIT ROOM TURNOVER OR (KITS) ×2 IMPLANT
MANIFOLD NEPTUNE II (INSTRUMENTS) ×2 IMPLANT
NEEDLE 22X1 1/2 (OR ONLY) (NEEDLE) IMPLANT
NS IRRIG 1000ML POUR BTL (IV SOLUTION) ×2 IMPLANT
PACK TOTAL JOINT (CUSTOM PROCEDURE TRAY) ×2 IMPLANT
PAD ARMBOARD 7.5X6 YLW CONV (MISCELLANEOUS) ×4 IMPLANT
PAD CAST 4YDX4 CTTN HI CHSV (CAST SUPPLIES) ×1 IMPLANT
PADDING CAST COTTON 4X4 STRL (CAST SUPPLIES) ×1
PADDING CAST COTTON 6X4 STRL (CAST SUPPLIES) ×2 IMPLANT
SET HNDPC FAN SPRY TIP SCT (DISPOSABLE) ×1 IMPLANT
SPONGE GAUZE 4X4 12PLY (GAUZE/BANDAGES/DRESSINGS) ×2 IMPLANT
STAPLER VISISTAT 35W (STAPLE) ×2 IMPLANT
SUCTION FRAZIER TIP 10 FR DISP (SUCTIONS) IMPLANT
SUT ETHIBOND NAB CT1 #1 30IN (SUTURE) ×6 IMPLANT
SUT VIC AB 0 CT1 27 (SUTURE) ×1
SUT VIC AB 0 CT1 27XBRD ANBCTR (SUTURE) ×1 IMPLANT
SUT VIC AB 2-0 CT1 27 (SUTURE) ×2
SUT VIC AB 2-0 CT1 TAPERPNT 27 (SUTURE) ×2 IMPLANT
SYR CONTROL 10ML LL (SYRINGE) IMPLANT
TOWEL OR 17X24 6PK STRL BLUE (TOWEL DISPOSABLE) ×2 IMPLANT
TOWEL OR 17X26 10 PK STRL BLUE (TOWEL DISPOSABLE) ×2 IMPLANT
TRAY FOLEY CATH 14FR (SET/KITS/TRAYS/PACK) ×2 IMPLANT
WATER STERILE IRR 1000ML POUR (IV SOLUTION) ×4 IMPLANT

## 2013-02-19 NOTE — Care Management Note (Signed)
    Page 1 of 2   02/22/2013     11:00:27 AM   CARE MANAGEMENT NOTE 02/22/2013  Patient:  Sharon Yu, Sharon Yu   Account Number:  [id number]  Date Initiated:  02/19/2013  Documentation initiated by:  [facility]  Subjective/Objective Assessment:   admitted postop total rt knee     Action/Plan:   plan return to home with HHPT   Anticipated DC Date:  02/22/2013   Anticipated DC Plan:  HOME W HOME HEALTH SERVICES      DC Planning Services  CM consult      Choice offered to / List presented to:     DME arranged  CPM      DME agency  [employer]     HH arranged  HH-2 PT      [facility] agency  [facility]   Status of service:  Completed, signed off Medicare Important Message given?   (If response is "NO", the following Medicare IM given date fields will be blank) Date Medicare IM given:   Date Additional Medicare IM given:    Discharge Disposition:  HOME W HOME HEALTH SERVICES  Per UR Regulation:  Reviewed for med. necessity/level of care/duration of stay  If discussed at Long Length of Stay Meetings, dates discussed:    Comments:  02/22/13 Spoke with patient and Sharon Dandy at [facility], no changes to plan for HHPT with [place]. Per Sharon Yu with [employer] [facility] will be delivered to patient's home today,patient already has rolling walker. Sharon Cree RN, BSN, CCM  02/19/13 Spoke with patient about HHC for HHPT she stated that shehhas been set up with [facility]. Contacted Sharon Yu with [facility], informed that patient not set up with them. Contacted Kelly at Dr. Candise Bowens office, patient set up for HHPT with [facility]. Received call from [facility] at [place], patient is set up with [place]. Informed patient that she is set up with [facility] for HHPT. Spoke with Sharon Yu from [employer], he wil see patient on 02/20/13 regarding DME needs. Sharon Cree RN, BSN, CCM

## 2013-02-19 NOTE — Transfer of Care (Signed)
Immediate Anesthesia Transfer of Care Note  Patient: Sharon Yu  Procedure(s) Performed: Procedure(s) with comments: TOTAL KNEE ARTHROPLASTY (Right) - right total knee arthroplasty  Patient Location: PACU  Anesthesia Type:General  Level of Consciousness: awake  Airway & Oxygen Therapy: Patient Spontanous Breathing and Patient connected to nasal cannula oxygen  Post-op Assessment: Report given to PACU RN and Post -op Vital signs reviewed and stable  Post vital signs: Reviewed and stable  Complications: No apparent anesthesia complications

## 2013-02-19 NOTE — H&P (View-Only) (Signed)
Pt doesn't have a cardiologist  Echo was done about 2 yrs ago with Dr.Chun with [facility]-to request report  Stress test was done about 3yrs ago and to request report  Denies ever having a heart cath  Dr.William Ammen in [place] is Medical Md  EKG report in chart from 01-18-13  Denies CXR in past yr  Clearance note in chart 

## 2013-02-19 NOTE — Progress Notes (Signed)
Report received from RN and have taken over care. Patient doing well, no complaints at this time. VSS. Will continue to monitor.

## 2013-02-19 NOTE — Anesthesia Postprocedure Evaluation (Signed)
Anesthesia Post Note  Patient: Sharon Yu  Procedure(s) Performed: Procedure(s) (LRB): TOTAL KNEE ARTHROPLASTY (Right)  Anesthesia type: general  Patient location: PACU  Post pain: Pain level controlled  Post assessment: Patient's Cardiovascular Status Stable  Last Vitals:  Filed Vitals:   02/19/13 1130  BP: 145/75  Pulse: 61  Temp:   Resp: 11    Post vital signs: Reviewed and stable  Level of consciousness: sedated  Complications: No apparent anesthesia complications

## 2013-02-19 NOTE — Evaluation (Signed)
Physical Therapy Evaluation Patient Details Name: Sharon Yu MRN: [id number] DOB: 1951-04-28 Today's Date: 02/19/2013 Time: 8756-4332 PT Time Calculation (min): 30 min  PT Assessment / Plan / Recommendation Clinical Impression  62 y.o. female admitted to [facility] for R TKA.  She is POD#0 and is mobilizing well.  She will likely progress well enough for HHPT and has 24 hour help from family at discharge.     PT Assessment  Patient needs continued PT services    Follow Up Recommendations  Home health PT    Does the patient have the potential to tolerate intense rehabilitation     NA  Barriers to Discharge None None    Equipment Recommendations  None recommended by PT    Recommendations for Other Services   none  Frequency 7X/week    Precautions / Restrictions Precautions Precautions: Knee Precaution Booklet Issued: No Precaution Comments: no pillow under surgical knee Required Braces or Orthoses: Knee Immobilizer - Right Knee Immobilizer - Right: On at all times Restrictions Weight Bearing Restrictions: Yes RLE Weight Bearing: Weight bearing as tolerated   Pertinent Vitals/Pain See vitals flow sheet.      Mobility  Bed Mobility Bed Mobility: Supine to Sit;Sitting - Scoot to Edge of Bed Supine to Sit: 4: Min assist;With rails;HOB elevated Sitting - Scoot to [place] of Bed: 4: Min assist;With rail Details for Bed Mobility Assistance: min assist to support right leg.  Verbal cues for hand placment and sequencing.   Transfers Transfers: Sit to Stand;Stand to Sit;Stand Pivot Transfers Sit to Stand: 4: Min assist;From elevated surface;With upper extremity assist;From bed Stand to Sit: 4: Min assist;With upper extremity assist;With armrests;To chair/3-in-1 Stand Pivot Transfers: 4: Min assist;From elevated surface;With armrests Details for Transfer Assistance: min assist to support trunk over weak legs.  Verbal cues for safe hand placement and right leg placement with  transitions.          PT Diagnosis: Difficulty walking;Abnormality of gait;Generalized weakness;Acute pain  PT Problem List: Decreased strength;Decreased range of motion;Decreased activity tolerance;Decreased balance;Decreased mobility;Decreased knowledge of use of DME;Decreased knowledge of precautions;Pain;Obesity PT Treatment Interventions: DME instruction;Gait training;Stair training;Functional mobility training;Therapeutic activities;Therapeutic exercise;Balance training;Neuromuscular re-education;Patient/family education;Modalities   PT Goals Acute Rehab PT Goals PT Goal Formulation: With patient Time For Goal Achievement: 02/26/13 Potential to Achieve Goals: Good Pt will go Supine/Side to Sit: with modified independence;with HOB 0 degrees PT Goal: Supine/Side to Sit - Progress: Goal set today Pt will go Sit to Supine/Side: with modified independence;with HOB 0 degrees PT Goal: Sit to Supine/Side - Progress: Goal set today Pt will go Sit to Stand: with supervision PT Goal: Sit to Stand - Progress: Goal set today Pt will go Stand to Sit: with supervision PT Goal: Stand to Sit - Progress: Goal set today Pt will Ambulate: with supervision;51 - 150 feet PT Goal: Ambulate - Progress: Goal set today Pt will Go Up / Down Stairs: 3-5 stairs;with min assist;with rail(s) PT Goal: Up/Down Stairs - Progress: Goal set today  Visit Information  Last PT Received On: 02/19/13 Assistance Needed: +1    Subjective Data  Subjective: Pt reports that her pain was a 10/10, but she just got some pain meds 5 mins before I came in the room.   Patient Stated Goal: to go home with family   Prior Functioning  Home Living Lives With: Spouse;Other (Comment) (granddaughter) Available Help at Discharge: Family;Available 24 hours/day Type of Home: House Home Access: Stairs to enter [employer] of Steps: 4 Entrance Stairs-Rails:  Right;Can reach both;Left Home Layout: One level Bathroom  Shower/Tub: Tub/shower unit;Curtain Bathroom Toilet: Handicapped height Bathroom Accessibility: Yes How Accessible: Accessible via walker Home Adaptive Equipment: Walker - standard;Bedside commode/3-in-1;Straight cane (her mom has one with wheels) Additional Comments: walked in the community with cane Prior Function Level of Independence: Independent with assistive device(s) Able to Take Stairs?: Yes Driving: Yes Vocation: Full time employment Comments: cahsier in [employer] Communication: No difficulties    Cognition  Cognition Arousal/Alertness: Awake/alert Behavior During Therapy: WFL for tasks assessed/performed Overall Cognitive Status: Within Functional Limits for tasks assessed    Extremity/Trunk Assessment Right Upper Extremity Assessment RUE ROM/Strength/Tone: [facility] for tasks assessed Left Upper Extremity Assessment LUE ROM/Strength/Tone: WFL for tasks assessed Right Lower Extremity Assessment RLE ROM/Strength/Tone: Deficits;Due to pain RLE ROM/Strength/Tone Deficits: deficits due to post op status.  Ankle 3/5, knee 2-/5, hip 2/5 Left Lower Extremity Assessment LLE ROM/Strength/Tone: WFL for tasks assessed      End of Session PT - End of Session Activity Tolerance: Patient limited by pain Patient left: in chair;with call bell/phone within reach;with family/visitor present Nurse Communication: Mobility status CPM Right Knee CPM Right Knee: On Right Knee Flexion (Degrees): 60    Sharon Yu B. Dahir Ayer, PT, DPT [phone]   02/19/2013, 4:55 PM

## 2013-02-19 NOTE — Progress Notes (Signed)
Orthopedic Tech Progress Note Patient Details:  Sharon Yu 03-24-1951 [id number]  CPM Right Knee CPM Right Knee: On Right Knee Flexion (Degrees): 60 Right Knee Extension (Degrees): 0 Additional Comments: Trapeze bar   Cammer, Mickie Bail 02/19/2013, 11:10 AM

## 2013-02-19 NOTE — Op Note (Signed)
NAME:  Sharon Yu, Sharon Yu NO.:  [id number]  MEDICAL RECORD NO.:  [id number]  LOCATION:  [facility]                         FACILITY:  [facility]  PHYSICIAN:  Dyke Brackett, M.D.    DATE OF BIRTH:  11-17-1950  DATE OF PROCEDURE:  02/19/2013 DATE OF DISCHARGE:                              OPERATIVE REPORT   PREOPERATIVE DIAGNOSIS:  Osteoarthritis, right knee.  POSTOPERATIVE DIAGNOSIS:  Osteoarthritis, right knee.  OPERATION: 1. Right total knee replacement (Sigma cemented knee size 4 femur, 10     mm, size 4, bearing size 3 tibia, 38 mm all poly 3 pegged patella). 2. Open lateral release of the right knee.  SURGEON:  Dyke Brackett, M.D.  ASSISTANT:  Margart Sickles, PA-C.  TOURNIQUET TIME:  1 hour 5 minutes.  BLOOD LOSS:  Minimal.  DESCRIPTION OF PROCEDURE:  A straight skin incision with medial parapatellar approach to the knee made.  We cut the distal femur, 11 mm and a 5-degree valgus followed by a 4 mm resection below the most diseased medial compartment extension gap with full slight hyperextension noted at 10 mm bearing.  We then sized the femur to be a size 4, set the rotation with the rotational cutting block with a 10 mm SIM.  We then placed the cutting block for the anterior-posterior chamfer cuts, __________ without difficulty.  Cleanup was made with excision of the menisci, posterior cruciate ligament resection as well, flexion gap nicely matched the extension gap at 10 mm.  Keel hole was cut for the tibia, followed by trial tibia.  Patella was cut leaving about 18 mm of native patella for 38 mm patella.  Then, the box cut was made on the femur for the size 4 femur, all trials were placed.  There is a tendency for the patella to translocated laterally. Therefore, we performed open lateral release with careful coagulation of the superior vessel relative to superior site above the release site and this helped improve patellar tracking.  The trial components  removed. All bony surfaces were irrigated with the inserted components with the cement in a doughy state, tibia followed by femur with a trial bearing. Cement was allowed to harden, trial bearing was removed.  Tourniquet was released.  No excess bleeding was noted.  Final bearing was inserted with again zero to slight hyperextension, good stability with varus and valgus and a slightly minimally positive anterior drawer test.  Hemovac drain was placed exiting superolaterally with closure with #1 Ethibond, 2-0 Vicryl and skin clips on the skin.  Lightly compressive sterile dressing, knee immobilizer applied, taken to recovery room in stable condition.     Dyke Brackett, M.D.     WDC/MEDQ  D:  02/19/2013  T:  02/19/2013  Job:  [id number]

## 2013-02-19 NOTE — Interval H&P Note (Signed)
History and Physical Interval Note:  02/19/2013 7:31 AM  Sharon Yu  has presented today for surgery, with the diagnosis of OA R KNEE  The various methods of treatment have been discussed with the patient and family. After consideration of risks, benefits and other options for treatment, the patient has consented to  Procedure(s): TOTAL KNEE ARTHROPLASTY (Right) as a surgical intervention .  The patient's history has been reviewed, patient examined, no change in status, stable for surgery.  I have reviewed the patient's chart and labs.  Questions were answered to the patient's satisfaction.     Sarath Privott JR,W D

## 2013-02-19 NOTE — Anesthesia Procedure Notes (Signed)
Anesthesia Regional Block:  Femoral nerve block  Pre-Anesthetic Checklist: ,, timeout performed, Correct Patient, Correct Site, Correct Laterality, Correct Procedure, Correct Position, site marked, Risks and benefits discussed,  Surgical consent,  Pre-op evaluation,  At surgeon's request and post-op pain management  Laterality: Right  Prep: chloraprep       Needles:  Injection technique: Single-shot  Needle Type: Echogenic Stimulator Needle     Needle Length:cm 9 cm Needle Gauge: 21 G    Additional Needles:  Procedures: ultrasound guided (picture in chart) and nerve stimulator Femoral nerve block  Nerve Stimulator or Paresthesia:  Response: 0.4 mA,   Additional Responses:   Narrative:  Start time: 02/19/2013 7:05 AM End time: 02/19/2013 7:20 AM Injection made incrementally with aspirations every 5 mL.  Performed by: Personally  Anesthesiologist: Arta Bruce MD  Additional Notes: Monitors applied. Patient sedated. Sterile prep and drape,hand hygiene and sterile gloves were used. Relevant anatomy identified.Needle position confirmed.Local anesthetic injected incrementally after negative aspiration. Local anesthetic spread visualized around nerve(s). Vascular puncture avoided. No complications. Image printed for medical record.The patient tolerated the procedure well.       Femoral nerve block

## 2013-02-19 NOTE — Anesthesia Preprocedure Evaluation (Addendum)
Anesthesia Evaluation  Patient identified by MRN, date of birth, ID band Patient awake    Reviewed: Allergy & Precautions, H&P , NPO status , Patient's Chart, lab work & pertinent test results  History of Anesthesia Complications Negative for: history of anesthetic complications  Airway Mallampati: I TM Distance: >3 FB Neck ROM: Full    Dental   Pulmonary shortness of breath,  Secondary to palpitations          Cardiovascular hypertension, Pt. on medications  Palpitations  EF 70 % by echo NL Stress    Neuro/Psych    GI/Hepatic   Endo/Other  Morbid obesity  Renal/GU      Musculoskeletal   Abdominal   Peds  Hematology   Anesthesia Other Findings   Reproductive/Obstetrics                          Anesthesia Physical Anesthesia Plan  ASA: III  Anesthesia Plan: General   Post-op Pain Management:    Induction: Intravenous  Airway Management Planned: LMA  Additional Equipment:   Intra-op Plan:   Post-operative Plan: Extubation in OR  Informed Consent: I have reviewed the patients History and Physical, chart, labs and discussed the procedure including the risks, benefits and alternatives for the proposed anesthesia with the patient or authorized representative who has indicated his/her understanding and acceptance.   Dental advisory given  Plan Discussed with: CRNA and Surgeon  Anesthesia Plan Comments:        Anesthesia Quick Evaluation

## 2013-02-19 NOTE — Brief Op Note (Signed)
02/19/2013  9:59 AM  PATIENT:  Sharon Yu  62 y.o. female  PRE-OPERATIVE DIAGNOSIS:  OSTEOARTHRITIS RIGHT KNEE  POST-OPERATIVE DIAGNOSIS:  OSTEOARTHRITIS RIGHT KNEE  PROCEDURE:  Procedure(s) with comments: TOTAL KNEE ARTHROPLASTY (Right) - right total knee arthroplasty Lateral release   SURGEON:  Surgeon(s) and Role:    * W D Carloyn Manner., MD - Primary  PHYSICIAN ASSISTANT: Margart Sickles, PA-C  ASSISTANTS:   ANESTHESIA:   regional and general  EBL:  Total I/O In: 1500 [I.V.:1500] Out: 200 [Urine:150; Blood:50]  BLOOD ADMINISTERED:none   DRAINS:hemovac drain right knee self suction  LOCAL MEDICATIONS USED:  NONE  SPECIMEN:  No Specimen  DISPOSITION OF SPECIMEN:  N/A  COUNTS:  YES  TOURNIQUET:   Total Tourniquet Time Documented: Thigh (Right) - 62 minutes Total: Thigh (Right) - 62 minutes   DICTATION: .Other Dictation: Dictation Number unknown  PLAN OF CARE: Admit to inpatient   PATIENT DISPOSITION:  PACU - hemodynamically stable.   Delay start of Pharmacological VTE agent (>24hrs) due to surgical blood loss or risk of bleeding: yes

## 2013-02-19 NOTE — Progress Notes (Signed)
UR COMPLETED  

## 2013-02-20 LAB — CBC
HCT: 31.3 % — ABNORMAL LOW (ref 36.0–46.0)
MCV: 86.2 fL (ref 78.0–100.0)
RBC: 3.63 MIL/uL — ABNORMAL LOW (ref 3.87–5.11)
WBC: 10.4 10*3/uL (ref 4.0–10.5)

## 2013-02-20 LAB — BASIC METABOLIC PANEL
BUN: 10 mg/dL (ref 6–23)
CO2: 25 mEq/L (ref 19–32)
Chloride: 105 mEq/L (ref 96–112)
Creatinine, Ser: 0.95 mg/dL (ref 0.50–1.10)
Potassium: 3.4 mEq/L — ABNORMAL LOW (ref 3.5–5.1)

## 2013-02-20 NOTE — Progress Notes (Signed)
Seen in Room 5N22 this AM  afebrile, vital signs stable, pulse 110/min adn BP 153/87  HB - 10.5,  WBC - 10.4K Drainage 500cc  S:  C/O pain, has been up with PT  O: seated in chair with knee extended and wrapped  A: stable  P: no change

## 2013-02-20 NOTE — Progress Notes (Signed)
Physical Therapy Treatment Patient Details Name: Sharon Yu MRN: [id number] DOB: September 27, 1950 Today's Date: 02/20/2013 Time: 4098-1191 PT Time Calculation (min): 21 min  PT Assessment / Plan / Recommendation Comments on Treatment Session  Pt therapy session focused on amb to bathroom and to bed. Pt unable to increase amb distance due to fatigue. Pt cont to demo decreased safety awareness with sit to stand transfer.Will cont to f/u with pt to maximize functional mobility.     Follow Up Recommendations  Home health PT;Supervision/Assistance - 24 hour     Does the patient have the potential to tolerate intense rehabilitation     Barriers to Discharge        Equipment Recommendations  None recommended by PT    Recommendations for Other Services    Frequency 7X/week   Plan Discharge plan remains appropriate;Frequency needs to be updated    Precautions / Restrictions Precautions Precautions: Knee Precaution Booklet Issued: No Precaution Comments: no pillow under surgical knee Required Braces or Orthoses: Knee Immobilizer - Right Knee Immobilizer - Right: On at all times Restrictions Weight Bearing Restrictions: Yes RLE Weight Bearing: Weight bearing as tolerated   Pertinent Vitals/Pain 5/10 pt premedicated. In cpm tolerating to 55degrees flexion    Mobility  Bed Mobility Bed Mobility: Sit to Supine Sit to Supine: 4: Min assist;HOB flat Details for Bed Mobility Assistance: A to lift R LE onto bed.  Transfers Transfers: Stand to Sit;Sit to Stand Sit to Stand: 4: Min assist;With upper extremity assist;With armrests;From chair/3-in-1 Stand to Sit: 4: Min assist;To bed;To chair/3-in-1 Details for Transfer Assistance: verbal cues for hand placement and safety with RW; pt tends to lean anteriorly and demo decreased safety awareness; verbal cues and demo given on proper technique for sit to stand  Ambulation/Gait Ambulation/Gait Assistance: 4: Min guard Ambulation Distance  (Feet): 20 Feet Assistive device: Rolling walker Ambulation/Gait Assistance Details: pt able to advance R LE indpendently this session;  verbal cues for proper gt sequencing and safety with RW Gait Pattern: Step-to pattern;Decreased stance time - right;Decreased step length - left;Wide base of support;Trunk flexed Gait velocity: decreased due to pain General Gait Details: pt unable to increase amb today due to fatigue  Stairs: No Wheelchair Mobility Wheelchair Mobility: No    Exercises Total Joint Exercises Ankle Circles/Pumps: AROM;Both;10 reps;Supine Heel Slides: AAROM;Right;10 reps;Supine Straight Leg Raises: AAROM;Right;Other reps (comment) (x3 limited by pain )   PT Diagnosis:    PT Problem List:   PT Treatment Interventions:     PT Goals Acute Rehab PT Goals PT Goal Formulation: With patient Time For Goal Achievement: 02/26/13 Potential to Achieve Goals: Good PT Goal: Sit to Supine/Side - Progress: Progressing toward goal PT Goal: Sit to Stand - Progress: Progressing toward goal PT Goal: Stand to Sit - Progress: Progressing toward goal PT Goal: Ambulate - Progress: Progressing toward goal  Visit Information  Last PT Received On: 02/20/13 Assistance Needed: +1 PT/OT Co-Evaluation/Treatment: Yes    Subjective Data  Subjective: pt sitting in chair; eager to return to bed. "I need to go to the bathroom before i get on that machine"  Patient Stated Goal: home with family   Cognition  Cognition Arousal/Alertness: Awake/alert Behavior During Therapy: WFL for tasks assessed/performed Overall Cognitive Status: Within Functional Limits for tasks assessed    Balance  Balance Balance Assessed: Yes Static Sitting Balance Static Sitting - Balance Support: No upper extremity supported;Feet supported Static Sitting - Level of Assistance: 5: Stand by assistance Static Standing Balance Static Standing -  Balance Support: No upper extremity supported;During functional  activity Static Standing - Level of Assistance: 5: Stand by assistance  End of Session PT - End of Session Equipment Utilized During Treatment: Gait belt;Right knee immobilizer Activity Tolerance: Patient limited by fatigue Patient left: in bed;in CPM;with call bell/phone within reach;with family/visitor present Nurse Communication: Mobility status   GP     Donell Sievert, [place] [phone] 02/20/2013, 2:46 PM

## 2013-02-20 NOTE — Progress Notes (Signed)
Physical Therapy Treatment Patient Details Name: Sharon Yu MRN: [id number] DOB: Sep 25, 1950 Today's Date: 02/20/2013 Time: 4098-1191 PT Time Calculation (min): 31 min  PT Assessment / Plan / Recommendation Comments on Treatment Session  pt is progressing well with therapy. able to increase amb and progress with exercises. requires increased time for mobility due to pain and max encouragement. will cont to f/u with pt to maximize functional mobility and ensure safe transition home.     Follow Up Recommendations  Home health PT     Does the patient have the potential to tolerate intense rehabilitation     Barriers to Discharge        Equipment Recommendations  None recommended by PT    Recommendations for Other Services    Frequency 7X/week   Plan Discharge plan remains appropriate;Frequency needs to be updated    Precautions / Restrictions Precautions Precautions: Knee Precaution Booklet Issued: No Precaution Comments: no pillow under surgical knee Required Braces or Orthoses: Knee Immobilizer - Right Knee Immobilizer - Right: On at all times Restrictions Weight Bearing Restrictions: Yes RLE Weight Bearing: Weight bearing as tolerated   Pertinent Vitals/Pain 9/10; pt premedicated.     Mobility  Bed Mobility Bed Mobility: Supine to Sit;Sitting - Scoot to Edge of Bed Supine to Sit: 4: Min assist;With rails;HOB elevated Sitting - Scoot to [place] of Bed: 4: Min assist;With rail Details for Bed Mobility Assistance: (A) to advance R LE to/off EOB; requires increased timedue to pain  Transfers Transfers: Stand to Sit;Sit to Stand Sit to Stand: 3: Mod assist;From elevated surface;From bed;With upper extremity assist Stand to Sit: 4: Min assist;With upper extremity assist;With armrests;To chair/3-in-1 Details for Transfer Assistance: pt required increased time to bring R LE under to support; pt initially c/o dizziness, subsided with time; verbal cues for hand placement and  safety with RW  Ambulation/Gait Ambulation/Gait Assistance: 4: Min assist Ambulation Distance (Feet): 50 Feet Assistive device: Rolling walker Ambulation/Gait Assistance Details: required (A) to advance R LE initally; verbal cues got gt sequencing and upright posture Gait Pattern: Step-to pattern;Decreased stance time - right;Decreased step length - left;Wide base of support;Trunk flexed Gait velocity: decreased due to pain Stairs: No Wheelchair Mobility Wheelchair Mobility: No    Exercises Total Joint Exercises Ankle Circles/Pumps: AROM;Both;10 reps;Supine Quad Sets: AROM;Right;10 reps;Seated Long Arc Quad: AAROM;Right;10 reps;Seated Knee Flexion: AAROM;10 reps;Right;Seated Goniometric ROM: -4 to 60 degrees AROM in sitting limited by pain   PT Diagnosis:    PT Problem List:   PT Treatment Interventions:     PT Goals Acute Rehab PT Goals PT Goal Formulation: With patient Time For Goal Achievement: 02/26/13 Potential to Achieve Goals: Good PT Goal: Supine/Side to Sit - Progress: Progressing toward goal PT Goal: Sit to Supine/Side - Progress: Progressing toward goal PT Goal: Sit to Stand - Progress: Progressing toward goal PT Goal: Stand to Sit - Progress: Progressing toward goal PT Goal: Ambulate - Progress: Progressing toward goal  Visit Information  Last PT Received On: 02/20/13 Assistance Needed: +1    Subjective Data  Subjective: Pt lying supine; agreeable to therapy Patient Stated Goal: home with family   Cognition  Cognition Arousal/Alertness: Awake/alert Behavior During Therapy: WFL for tasks assessed/performed Overall Cognitive Status: Within Functional Limits for tasks assessed    Balance  Balance Balance Assessed: Yes Static Sitting Balance Static Sitting - Balance Support: No upper extremity supported;Feet supported Static Sitting - Level of Assistance: 5: Stand by assistance Static Sitting - Comment/# of Minutes: tolerated sitting eob ~  while  dizziness subsided  End of Session PT - End of Session Equipment Utilized During Treatment: Gait belt;Right knee immobilizer Activity Tolerance: Patient tolerated treatment well Patient left: in chair;with call bell/phone within reach;with family/visitor present Nurse Communication: Mobility status   GP     Donell Sievert, [place] [phone] 02/20/2013, 9:19 AM

## 2013-02-20 NOTE — Evaluation (Signed)
Occupational Therapy Evaluation Patient Details Name: Sharon Yu MRN: [id number] DOB: December 11, 1950 Today's Date: 02/20/2013 Time: 9811-9147 OT Time Calculation (min): 18 min  OT Assessment / Plan / Recommendation Clinical Impression    62 y.o. female admitted to [facility] for R TKA. Pt demonstrating decreased safety awareness with transfers.Overall Mod A for LB ADLs. Pt planning to d/c home with 24/7 assist available.      OT Assessment  Patient needs continued OT Services    Follow Up Recommendations  No OT follow up;Supervision/Assistance - 24 hour    Barriers to Discharge      Equipment Recommendations  None recommended by OT    Recommendations for Other Services    Frequency  Min 2X/week    Precautions / Restrictions Precautions Precautions: Knee Precaution Booklet Issued: No Precaution Comments: no pillow under surgical knee Required Braces or Orthoses: Knee Immobilizer - Right Knee Immobilizer - Right: On at all times Restrictions Weight Bearing Restrictions: Yes RLE Weight Bearing: Weight bearing as tolerated   Pertinent Vitals/Pain 5/10 pain. Pt premedicated.     ADL  Eating/Feeding: Independent Where Assessed - Eating/Feeding: Chair Grooming: Wash/dry hands;Min guard Where Assessed - Grooming: Unsupported standing Upper Body Bathing: Set up Where Assessed - Upper Body Bathing: Supported sitting Lower Body Bathing: Moderate assistance Where Assessed - Lower Body Bathing: Supported sit to stand Upper Body Dressing: Set up Where Assessed - Upper Body Dressing: Supported sitting Lower Body Dressing: Moderate assistance Where Assessed - Lower Body Dressing: Supported sit to Pharmacist, hospital: Minimal Dentist Method: Sit to Barista: Raised toilet seat with arms (or 3-in-1 over toilet) Toileting - Clothing Manipulation and Hygiene: Minimal assistance Where Assessed - Engineer, mining and Hygiene: Sit to  stand from 3-in-1 or toilet Tub/Shower Transfer Method: Not assessed Equipment Used: Gait belt;Knee Immobilizer;Rolling walker ADL Comments: Pt overall Mod A  for LB ADLs. Pt can reach left foot to don/doff sock, but not right foot. Pt performed toileting as well as grooming at sink.    OT Diagnosis: Acute pain  OT Problem List: Decreased strength;Decreased range of motion;Decreased activity tolerance;Impaired balance (sitting and/or standing);Decreased knowledge of use of DME or AE;Decreased knowledge of precautions;Pain;Decreased safety awareness OT Treatment Interventions: Self-care/ADL training;DME and/or AE instruction;Therapeutic activities;Patient/family education;Balance training   OT Goals Acute Rehab OT Goals OT Goal Formulation: With patient Time For Goal Achievement: 02/27/13 Potential to Achieve Goals: Good ADL Goals Pt Will Perform Grooming: with modified independence;Standing at sink ADL Goal: Grooming - Progress: Goal set today Pt Will Perform Lower Body Bathing: with modified independence;Sit to stand from chair ADL Goal: Lower Body Bathing - Progress: Goal set today Pt Will Perform Lower Body Dressing: with modified independence;Sit to stand from bed;Sit to stand from chair ADL Goal: Lower Body Dressing - Progress: Goal set today Pt Will Transfer to Toilet: with modified independence;Ambulation;with DME ADL Goal: Toilet Transfer - Progress: Goal set today Pt Will Perform Toileting - Clothing Manipulation: with modified independence;Standing ADL Goal: Toileting - Clothing Manipulation - Progress: Goal set today Pt Will Perform Toileting - Hygiene: with modified independence;Sit to stand from 3-in-1/toilet;Sitting on 3-in-1 or toilet ADL Goal: Toileting - Hygiene - Progress: Goal set today Pt Will Perform Tub/Shower Transfer: Tub transfer;Ambulation;with DME ADL Goal: Tub/Shower Transfer - Progress: Goal set today  Visit Information  Last OT Received On:  02/20/13 Assistance Needed: +1 PT/OT Co-Evaluation/Treatment: Yes    Subjective Data      Prior Functioning  Home Living Lives With: Spouse;Other (Comment) (granddaughter) Available Help at Discharge: Family;Available 24 hours/day Type of Home: House Home Access: Stairs to enter [employer] of Steps: 4 Entrance Stairs-Rails: Right;Can reach both;Left Home Layout: One level Bathroom Shower/Tub: Forensic scientist: Handicapped height Bathroom Accessibility: Yes How Accessible: Accessible via walker Home Adaptive Equipment: Walker - standard;Bedside commode/3-in-1;Straight cane (her mom has one with wheels) Additional Comments: walked in the community with cane Prior Function Level of Independence: Independent with assistive device(s) Able to Take Stairs?: Yes Driving: Yes Vocation: Full time employment Comments: Conservation officer, nature in [employer] Communication: No difficulties         Vision/Perception     Cognition  Cognition Arousal/Alertness: Awake/alert Behavior During Therapy: WFL for tasks assessed/performed Overall Cognitive Status: Within Functional Limits for tasks assessed    Extremity/Trunk Assessment Right Upper Extremity Assessment RUE ROM/Strength/Tone: [facility] for tasks assessed Left Upper Extremity Assessment LUE ROM/Strength/Tone: WFL for tasks assessed     Mobility Bed Mobility Bed Mobility: Sit to Supine Sit to Supine: 4: Min assist;HOB flat Details for Bed Mobility Assistance: A to lift R LE onto bed.  Transfers Transfers: Sit to Stand;Stand to Sit Sit to Stand: 4: Min assist;With upper extremity assist;With armrests;From chair/3-in-1 Stand to Sit: 4: Min assist;To bed;To chair/3-in-1 Details for Transfer Assistance: verbal cues for hand placement and safety with RW; pt tends to lean anteriorly and demo decreased safety awareness; verbal cues and demo given on proper technique for sit to stand             End of Session OT - End of Session Equipment Utilized During Treatment: Gait belt;Right knee immobilizer Activity Tolerance: Patient limited by fatigue Patient left: in bed;with call bell/phone within reach;with family/visitor present  GO     Earlie Raveling OTR/L [phone] 02/20/2013, 2:54 PM

## 2013-02-21 LAB — CBC
HCT: 29.3 % — ABNORMAL LOW (ref 36.0–46.0)
Hemoglobin: 9.7 g/dL — ABNORMAL LOW (ref 12.0–15.0)
MCHC: 33.1 g/dL (ref 30.0–36.0)
MCV: 86.4 fL (ref 78.0–100.0)
RDW: 14.3 % (ref 11.5–15.5)
WBC: 13.4 10*3/uL — ABNORMAL HIGH (ref 4.0–10.5)

## 2013-02-21 NOTE — Progress Notes (Signed)
Occupational Therapy Treatment Patient Details Name: Sharon Yu MRN: [id number] DOB: 06/26/1951 Today's Date: 02/21/2013 Time: 4098-1191 OT Time Calculation (min): 25 min  OT Assessment / Plan / Recommendation Comments on Treatment Session pt progressing towards goals    Follow Up Recommendations  No OT follow up;Supervision/Assistance - 24 hour    Barriers to Discharge       Equipment Recommendations  None recommended by OT    Recommendations for Other Services    Frequency Min 2X/week   Plan Discharge plan remains appropriate    Precautions / Restrictions Precautions Precautions: Knee Precaution Booklet Issued: No Precaution Comments: no pillow under surgical knee Required Braces or Orthoses: Knee Immobilizer - Right Knee Immobilizer - Right: On at all times Restrictions Weight Bearing Restrictions: Yes RLE Weight Bearing: Weight bearing as tolerated   Pertinent Vitals/Pain     ADL  Grooming: Wash/dry hands;Min guard Where Assessed - Grooming: Unsupported standing Lower Body Bathing: Minimal assistance Where Assessed - Lower Body Bathing: Supported sit to stand Lower Body Dressing: Minimal assistance Where Assessed - Lower Body Dressing: Supported sit to Pharmacist, hospital: Minimal Dentist Method: Sit to Barista: Raised toilet seat with arms (or 3-in-1 over toilet) Toileting - Clothing Manipulation and Hygiene: Minimal assistance Where Assessed - Engineer, mining and Hygiene: Sit to stand from 3-in-1 or toilet Tub/Shower Transfer Method: Other (comment);Not assessed (pt reports she will sponge bathe initially at discharge) Equipment Used: Gait belt;Knee Immobilizer;Rolling walker Transfers/Ambulation Related to ADLs: min A overall transfers and amb with RW in room ADL Comments: pt reports she will have family A with LB ADLs and that she will sponge bathe initially at discharge.     OT Diagnosis:     OT Problem List:   OT Treatment Interventions:     OT Goals ADL Goals ADL Goal: Grooming - Progress: Progressing toward goals ADL Goal: Lower Body Bathing - Progress: Progressing toward goals ADL Goal: Lower Body Dressing - Progress: Progressing toward goals ADL Goal: Toilet Transfer - Progress: Progressing toward goals ADL Goal: Toileting - Clothing Manipulation - Progress: Progressing toward goals ADL Goal: Toileting - Hygiene - Progress: Progressing toward goals ADL Goal: Tub/Shower Transfer - Progress: Other (comment) (pt states she will sponge bathe initially)  Visit Information  Last OT Received On: 02/21/13 Assistance Needed: +1    Subjective Data      Prior Functioning       Cognition  Cognition Arousal/Alertness: Awake/alert Behavior During Therapy: WFL for tasks assessed/performed Overall Cognitive Status: Within Functional Limits for tasks assessed    Mobility  Bed Mobility Bed Mobility: Supine to Sit Supine to Sit: 4: Min assist;With rails;HOB elevated Details for Bed Mobility Assistance: (A) pt can advance R LE >50% of the way to EOB; requires (A) to complete supine to sit and control descent of R LE off EOB; increased time required due to pain Transfers Sit to Stand: 4: Min assist;With upper extremity assist;With armrests;From bed Stand to Sit: 4: Min assist;To chair/3-in-1;With armrests;With upper extremity assist Details for Transfer Assistance: verbal cues for hand placement and safety; pt requires verbal cues to bring R LE in for BOS; pt tends to ER R LE     Exercises  Total Joint Exercises Ankle Circles/Pumps: AROM;Both;10 reps;Supine Quad Sets: AROM;Right;10 reps;Seated Hip ABduction/ADduction: AAROM;Right;10 reps;Supine (c/o pain ) Straight Leg Raises: AAROM;Right;5 reps;Supine (limited reps due to pain)   Balance Balance Balance Assessed: No   End of Session OT - End of  Session Equipment Utilized During Treatment: Gait belt;Right knee  immobilizer Activity Tolerance: Patient tolerated treatment well Patient left: in chair;with call bell/phone within reach;with family/visitor present  GO     Sharon Yu A 02/21/2013, 12:22 PM

## 2013-02-21 NOTE — Progress Notes (Signed)
Physical Therapy Treatment Patient Details Name: Sharon Yu MRN: [id number] DOB: [birth year] Today's Date: 02/21/2013 Time: 4098-1191 PT Time Calculation (min): 30 min  PT Assessment / Plan / Recommendation Comments on Treatment Session  Pt able to increase amb disstance. Highly motivated to return home tomorrow with family. c/o pain predominately with knee flexion. plan to address stairs in morning session or prior to D/C home.     Follow Up Recommendations  Home health PT;Supervision/Assistance - 24 hour     Does the patient have the potential to tolerate intense rehabilitation     Barriers to Discharge        Equipment Recommendations  None recommended by PT    Recommendations for Other Services    Frequency 7X/week   Plan Discharge plan remains appropriate;Frequency needs to be updated    Precautions / Restrictions Precautions Precautions: Knee Precaution Booklet Issued: No Precaution Comments: no pillow under surgical knee Required Braces or Orthoses: Knee Immobilizer - Right Knee Immobilizer - Right: On at all times Restrictions Weight Bearing Restrictions: Yes RLE Weight Bearing: Weight bearing as tolerated   Pertinent Vitals/Pain 8/10; medicated during session.     Mobility  Bed Mobility Bed Mobility: Supine to Sit Sit to Supine: 4: Min assist;HOB flat;With rail Details for Bed Mobility Assistance: required (A) to advance R LE onto bed; pt attempted to (A) R LE with L LE  or bil UEs but pt unable to; requires increased time due to pain  Transfers Transfers: Sit to Stand;Stand to Sit Sit to Stand: 4: Min assist;With upper extremity assist;With armrests;From chair/3-in-1 Stand to Sit: 4: Min assist;With armrests;With upper extremity assist;To bed Details for Transfer Assistance: pt tends to lean anteriorly and demo decreased safety awareness; requires verbal cues for hand placement and safety with RW  Ambulation/Gait Ambulation/Gait Assistance: 4: Min  guard Ambulation Distance (Feet): 80 Feet Assistive device: Rolling walker Ambulation/Gait Assistance Details: pt fatigues easiliy and requires verbal cues to not rest on RW: verbal cues for gt sequencing Gait Pattern: Step-to pattern;Decreased stance time - right;Decreased step length - left;Wide base of support;Trunk flexed Gait velocity: decreased due to pain Stairs: No Wheelchair Mobility Wheelchair Mobility: No    Exercises Total Joint Exercises Ankle Circles/Pumps: AROM;Both;10 reps;Supine Heel Slides: AAROM;Right;10 reps;Seated Knee Flexion: AAROM;10 reps;Right;Seated Goniometric ROM: -2 to 50 degrees AROM in sitting    PT Diagnosis:    PT Problem List:   PT Treatment Interventions:     PT Goals Acute Rehab PT Goals PT Goal Formulation: With patient Time For Goal Achievement: 02/26/13 Potential to Achieve Goals: Good PT Goal: Sit to Supine/Side - Progress: Progressing toward goal PT Goal: Sit to Stand - Progress: Progressing toward goal PT Goal: Stand to Sit - Progress: Progressing toward goal PT Goal: Ambulate - Progress: Progressing toward goal  Visit Information  Last PT Received On: 02/21/13 Assistance Needed: +1    Subjective Data  Subjective: pt sitting in chair eager to return to bed; requesting pain medicine; RN notified.  Patient Stated Goal: home with family tomorrow    Cognition  Cognition Arousal/Alertness: Awake/alert Behavior During Therapy: WFL for tasks assessed/performed Overall Cognitive Status: Within Functional Limits for tasks assessed    Balance  Balance Balance Assessed: No  End of Session PT - End of Session Equipment Utilized During Treatment: Gait belt;Right knee immobilizer Activity Tolerance: Patient tolerated treatment well Patient left: in chair;with call bell/phone within reach;with family/visitor present Nurse Communication: Mobility status;Patient requests pain meds   GP  Donnamarie Poag [place], [place] [phone] 02/21/2013, 2:06  PM

## 2013-02-21 NOTE — Progress Notes (Signed)
Physical Therapy Treatment Patient Details Name: Sharon Yu MRN: [id number] DOB: November 02, 1950 Today's Date: 02/21/2013 Time: 3244-0102 PT Time Calculation (min): 34 min  PT Assessment / Plan / Recommendation Comments on Treatment Session  Pt limited ambulation distance due to pain. Pt highly motivated to return home. requires min (A) for mobility and increased time due to pain. will cont to f/u with pt to maximize functional mobility    Follow Up Recommendations  Home health PT;Supervision/Assistance - 24 hour     Does the patient have the potential to tolerate intense rehabilitation     Barriers to Discharge        Equipment Recommendations  None recommended by PT    Recommendations for Other Services    Frequency 7X/week   Plan Discharge plan remains appropriate;Frequency needs to be updated    Precautions / Restrictions Precautions Precautions: Knee Precaution Booklet Issued: No Precaution Comments: no pillow under surgical knee Required Braces or Orthoses: Knee Immobilizer - Right Knee Immobilizer - Right: On at all times Restrictions Weight Bearing Restrictions: Yes RLE Weight Bearing: Weight bearing as tolerated   Pertinent Vitals/Pain 8/10; pt given oral pain medicine during treatment session by RN.     Mobility  Bed Mobility Bed Mobility: Supine to Sit Supine to Sit: 4: Min assist;With rails;HOB elevated Details for Bed Mobility Assistance: (A) pt can advance R LE >50% of the way to EOB; requires (A) to complete supine to sit and control descent of R LE off EOB; increased time required due to pain Transfers Transfers: Sit to Stand;Stand to Sit Sit to Stand: 4: Min assist;With upper extremity assist;With armrests;From bed Stand to Sit: 4: Min assist;To chair/3-in-1;With armrests;With upper extremity assist Details for Transfer Assistance: verbal cues for hand placement and safety; pt requires verbal cues to bring R LE in for BOS; pt tends to ER R LE   Ambulation/Gait Ambulation/Gait Assistance: 4: Min guard Ambulation Distance (Feet): 50 Feet Assistive device: Rolling walker Ambulation/Gait Assistance Details: pt required 2 standing rest breaks due to fatigue and pain; tends to ER R LE; verbal cues for gt sequencing and safety with RW Gait Pattern: Step-to pattern;Decreased stance time - right;Decreased step length - left;Wide base of support;Trunk flexed Gait velocity: decreased due to pain Stairs: No Wheelchair Mobility Wheelchair Mobility: No    Exercises Total Joint Exercises Ankle Circles/Pumps: AROM;Both;10 reps;Supine Quad Sets: AROM;Right;10 reps;Seated Hip ABduction/ADduction: AAROM;Right;10 reps;Supine (c/o pain ) Straight Leg Raises: AAROM;Right;5 reps;Supine (limited reps due to pain)   PT Diagnosis:    PT Problem List:   PT Treatment Interventions:     PT Goals Acute Rehab PT Goals PT Goal Formulation: With patient Time For Goal Achievement: 02/26/13 Potential to Achieve Goals: Good PT Goal: Supine/Side to Sit - Progress: Progressing toward goal PT Goal: Sit to Stand - Progress: Progressing toward goal PT Goal: Stand to Sit - Progress: Progressing toward goal PT Goal: Ambulate - Progress: Progressing toward goal  Visit Information  Last PT Received On: 02/21/13 Assistance Needed: +1    Subjective Data  Subjective: pt lying supine states "i can do therapy but i need medicine. i havent had anything for pain all night" RN notified  Patient Stated Goal: home with family   Cognition  Cognition Arousal/Alertness: Awake/alert Behavior During Therapy: WFL for tasks assessed/performed Overall Cognitive Status: Within Functional Limits for tasks assessed    Balance  Balance Balance Assessed: No  End of Session PT - End of Session Equipment Utilized During Treatment: Gait belt;Right knee  immobilizer Activity Tolerance: Patient limited by pain Patient left: in chair;with call bell/phone within reach;with  family/visitor present Nurse Communication: Mobility status;Patient requests pain meds   GP     Donell Sievert,  [phone] 02/21/2013, 8:39 AM

## 2013-02-21 NOTE — Progress Notes (Signed)
Seen in room 5N22 this am  Afebrile vital signs stable  Drainage - 15 cc's  S: c/o pain at PT and PT was limited by pain according to note  O: seated, sem-reclined in chair at bedside.  Old blood in hemovac tubing  A: stable  P: remove hemovac

## 2013-02-22 LAB — CBC
Hemoglobin: 8.3 g/dL — ABNORMAL LOW (ref 12.0–15.0)
MCH: 29.3 pg (ref 26.0–34.0)
MCHC: 34 g/dL (ref 30.0–36.0)
MCV: 86.2 fL (ref 78.0–100.0)
RBC: 2.83 MIL/uL — ABNORMAL LOW (ref 3.87–5.11)

## 2013-02-22 NOTE — Progress Notes (Signed)
Subjective: 3 Days Post-Op Procedure(s) (LRB): TOTAL KNEE ARTHROPLASTY (Right) Patient reports pain as mild and moderate.    Objective: Vital signs in last 24 hours: Temp:  [99.1 F (37.3 C)-99.3 F (37.4 C)] 99.3 F (37.4 C) (06/23 0605) Pulse Rate:  [96-120] 96 (06/23 0605) Resp:  [16-18] 16 (06/23 0605) BP: (112-126)/(67-71) 112/71 mmHg (06/23 0605) SpO2:  [97 %-99 %] 99 % (06/23 0605)  Intake/Output from previous day: 06/22 0701 - 06/23 0700 In: 2940 [P.O.:240; I.V.:2700] Out: -  Intake/Output this shift:     Recent Labs  02/19/13 1340 02/20/13 0615 02/21/13 0550 02/22/13 0446  HGB 12.2 10.5* 9.7* 8.3*    Recent Labs  02/21/13 0550 02/22/13 0446  WBC 13.4* 11.9*  RBC 3.39* 2.83*  HCT 29.3* 24.4*  PLT 156 161    Recent Labs  02/19/13 1340 02/20/13 0615  NA  --  139  K  --  3.4*  CL  --  105  CO2  --  25  BUN  --  10  CREATININE 0.89 0.95  GLUCOSE  --  145*  CALCIUM  --  8.0*   No results found for this basename: LABPT, INR,  in the last 72 hours  Neurovascular intact Sensation intact distally Intact pulses distally Dorsiflexion/Plantar flexion intact Incision: dressing C/D/I and scant drainage No cellulitis present Compartment soft  Assessment/Plan: 3 Days Post-Op Procedure(s) (LRB): TOTAL KNEE ARTHROPLASTY (Right) Up with therapy Discharge home with home health  Margart Sickles 02/22/2013, 8:44 AM

## 2013-02-22 NOTE — Progress Notes (Signed)
Physical Therapy Treatment Patient Details Name: Sharon Yu MRN: [id number] DOB: May 12, 1951 Today's Date: 02/22/2013 Time: 8295-6213 PT Time Calculation (min): 33 min  PT Assessment / Plan / Recommendation Comments on Treatment Session  Pt progressing well. Pt feels safe and is ready to D/C with husband. Discussed multiple home managment stratgies with husband and pt.  Will benefit from additional therapy session today to reinforce safety. Pt states she does not need session but PT will see if here.     Follow Up Recommendations  Home health PT;Supervision/Assistance - 24 hour     Does the patient have the potential to tolerate intense rehabilitation     Barriers to Discharge        Equipment Recommendations  None recommended by PT    Recommendations for Other Services    Frequency 7X/week   Plan Discharge plan remains appropriate;Frequency needs to be updated    Precautions / Restrictions Precautions Precautions: Knee Precaution Booklet Issued: No Precaution Comments: no pillow under surgical knee Required Braces or Orthoses: Knee Immobilizer - Right Knee Immobilizer - Right: On at all times Restrictions Weight Bearing Restrictions: Yes RLE Weight Bearing: Weight bearing as tolerated   Pertinent Vitals/Pain Pt premedicated with oral pain medicine per RN; 7/10 at end of session     Mobility  Bed Mobility Bed Mobility: Not assessed (pt standing with RN present ) Transfers Transfers: Sit to Stand;Stand to Sit Sit to Stand: 4: Min guard;From chair/3-in-1;With armrests Stand to Sit: 4: Min guard;To chair/3-in-1;With armrests;With upper extremity assist Details for Transfer Assistance: pt cont to lean anteriorly and demo decreased safety awareness; requires increased time due to pain  Ambulation/Gait Ambulation/Gait Assistance: 4: Min guard Ambulation Distance (Feet): 120 Feet Assistive device: Rolling walker Ambulation/Gait Assistance Details: pt requires 1 standing  rest break; requires verbal cues for RW safety; tends to pick up RW to advance; progressed to step through gt with cues  Gait Pattern: Step-to pattern;Decreased stance time - right;Decreased step length - left;Wide base of support;Trunk flexed;Step-through pattern Gait velocity: decreased due to pain Stairs: Yes Stairs Assistance: 4: Min assist Stairs Assistance Details (indicate cue type and reason): requires cues for stair sequencing and gave husband demo on proper guarding techniques   Stair Management Technique: Two rails;Step to pattern;Forwards Number of Stairs: 2 Wheelchair Mobility Wheelchair Mobility: No    Exercises Total Joint Exercises Ankle Circles/Pumps: AROM;Both;10 reps;Supine Quad Sets: AROM;Right;10 reps;Seated   PT Diagnosis:    PT Problem List:   PT Treatment Interventions:     PT Goals Acute Rehab PT Goals PT Goal Formulation: With patient Time For Goal Achievement: 02/26/13 Potential to Achieve Goals: Good PT Goal: Sit to Stand - Progress: Progressing toward goal PT Goal: Stand to Sit - Progress: Progressing toward goal PT Goal: Ambulate - Progress: Progressing toward goal PT Goal: Up/Down Stairs - Progress: Progressing toward goal  Visit Information  Last PT Received On: 02/22/13 Assistance Needed: +1    Subjective Data  Subjective: pt standing up with RN present; pt ready to work and go home today  Patient Stated Goal: home today   Cognition  Cognition Arousal/Alertness: Awake/alert Behavior During Therapy: WFL for tasks assessed/performed Overall Cognitive Status: Within Functional Limits for tasks assessed    Balance  Balance Balance Assessed: No  End of Session PT - End of Session Equipment Utilized During Treatment: Gait belt;Right knee immobilizer Activity Tolerance: Patient tolerated treatment well Patient left: in chair;with call bell/phone within reach;with family/visitor present Nurse Communication: Mobility status  GP     Shelva Majestic  [place], [place] [phone] 02/22/2013, 9:55 AM

## 2013-02-22 NOTE — Progress Notes (Signed)
Occupational Therapy Treatment Patient Details Name: Sharon Yu MRN: [id number] DOB: 08-Aug-1951 Today's Date: 02/22/2013 Time: 4098-1191 OT Time Calculation (min): 9 min  OT Assessment / Plan / Recommendation Comments on Treatment Session Patient to d/c home today.    Follow Up Recommendations  No OT follow up;Supervision/Assistance - 24 hour    Barriers to Discharge       Equipment Recommendations  None recommended by OT    Recommendations for Other Services    Frequency Min 2X/week   Plan Discharge plan remains appropriate    Precautions / Restrictions Precautions Precautions: Knee Precaution Booklet Issued: No Precaution Comments: no pillow under surgical knee Required Braces or Orthoses: Knee Immobilizer - Right Knee Immobilizer - Right: On at all times Restrictions Weight Bearing Restrictions: Yes RLE Weight Bearing: Weight bearing as tolerated   Pertinent Vitals/Pain     ADL  ADL Comments: Reviewed tub transfer technique verbally with pt although she states that she will be sponge bathing initially. Reviewed toilet transfer with 3 in 1 and LB dressing techniques. Answered all questions regarding ADLs for discharge. Pt. to discharge home today with family A as needed.         OT Goals    Visit Information  Last OT Received On: 02/22/13 Assistance Needed: +1    Cognition  Cognition Arousal/Alertness: Awake/alert Behavior During Therapy: WFL for tasks assessed/performed Overall Cognitive Status: Within Functional Limits for tasks assessed    Balance    End of Session OT - End of Session Activity Tolerance: Patient tolerated treatment well Patient left: in chair;with call bell/phone within reach;with family/visitor present  GO     Emsley Custer A 02/22/2013, 12:44 PM

## 2013-02-22 NOTE — Progress Notes (Signed)
1000 Pt gave self lovenox inj watched lovenox video verbalized understanding.

## 2013-02-22 NOTE — Discharge Summary (Signed)
PATIENT ID: Sharon Yu        MRN:  [id number]          DOB/AGE: March 22, 1951 / 62 y.o.    DISCHARGE SUMMARY  ADMISSION DATE:    02/19/2013 DISCHARGE DATE:   02/22/2013   ADMISSION DIAGNOSIS: OA R KNEE    DISCHARGE DIAGNOSIS:  OSTEOARTHRITIS RIGHT KNEE    ADDITIONAL DIAGNOSIS: Active Problems:   * No active hospital problems. *  Past Medical History  Diagnosis Date  . Hypertension     takes Maxzide and Metoprolol daily along with Ramipril  . Hyperlipidemia     takes Crestor daily  . Shortness of breath     with exertion  . Arthritis   . Joint pain   . Joint swelling   . Back pain     buldging disc  . Hypopotassemia     takes Potassium pill daily    PROCEDURE: Procedure(s): TOTAL KNEE ARTHROPLASTY Righton 02/19/2013  CONSULTS: none     HISTORY:  See H&P in chart  HOSPITAL COURSE:  Sharon Yu is a 62 y.o. admitted on 02/19/2013 and found to have a diagnosis of OSTEOARTHRITIS RIGHT KNEE.  After appropriate laboratory studies were obtained  they were taken to the operating room on 02/19/2013 and underwent  Procedure(s): TOTAL KNEE ARTHROPLASTY  Right.   They were given perioperative antibiotics:  Anti-infectives   Start     Dose/Rate Route Frequency Ordered Stop   02/19/13 1330  ceFAZolin (ANCEF) IVPB 2 g/50 mL premix     2 g 100 mL/hr over 30 Minutes Intravenous Every 6 hours 02/19/13 1309 02/19/13 2158   02/19/13 0600  ceFAZolin (ANCEF) IVPB 2 g/50 mL premix     2 g 100 mL/hr over 30 Minutes Intravenous On call to O.R. 02/18/13 1422 02/19/13 0750    .  Tolerated the procedure well.  Placed with a foley intraoperatively.       POD #1, allowed out of bed to a chair.  PT for ambulation and exercise program.  Foley D/C'd in morning.  IV saline locked.  O2 discontionued.  POD #2, continued PT and ambulation.   Hemovac pulled. .  The remainder of the hospital course was dedicated to ambulation and strengthening.   The patient was discharged on 3 Days  Post-Op in  Stable condition.  Blood products given:none  DIAGNOSTIC STUDIES: Recent vital signs: Patient Vitals for the past 24 hrs:  BP Temp Pulse Resp SpO2  02/22/13 0605 112/71 mmHg 99.3 F (37.4 C) 96 16 99 %  02/21/13 2204 126/67 mmHg 99.3 F (37.4 C) 120 16 97 %  02/21/13 1559 - - - 18 99 %  02/21/13 1350 112/70 mmHg 99.1 F (37.3 C) 99 16 99 %  02/21/13 1200 - - - 16 98 %       Recent laboratory studies:  Recent Labs  02/19/13 1340 02/20/13 0615 02/21/13 0550 02/22/13 0446  WBC 11.8* 10.4 13.4* 11.9*  HGB 12.2 10.5* 9.7* 8.3*  HCT 36.8 31.3* 29.3* 24.4*  PLT 177 166 156 161    Recent Labs  02/19/13 1340 02/20/13 0615  NA  --  139  K  --  3.4*  CL  --  105  CO2  --  25  BUN  --  10  CREATININE 0.89 0.95  GLUCOSE  --  145*  CALCIUM  --  8.0*   Lab Results  Component Value Date   INR 0.93 02/10/2013   INR  1.0 09/12/2008     Recent Radiographic Studies :  Dg Chest 2 View  02/10/2013   *RADIOLOGY REPORT*  Clinical Data:  Right knee osteoarthritis, preop  CHEST - 2 VIEW  Comparison: None.  Findings: Cardiomediastinal silhouette is unremarkable.  No acute infiltrate or pleural effusion.  No pulmonary edema.  Degenerative changes thoracic spine.  IMPRESSION: No active disease.   Original Report Authenticated By: Natasha Mead, M.D.    DISCHARGE INSTRUCTIONS:   DISCHARGE MEDICATIONS:     Medication List    TAKE these medications       alendronate 70 MG tablet  Commonly known as:  FOSAMAX  Take 70 mg by mouth every 7 (seven) days. Takes on Thursdays.  Take with a full glass of water on an empty stomach.     APPLE CIDER VINEGAR PO  Take 10 mLs by mouth 2 (two) times daily.     enoxaparin 30 MG/0.3ML injection  Commonly known as:  LOVENOX  Inject 0.3 mLs (30 mg total) into the skin every 12 (twelve) hours.     methocarbamol 500 MG tablet  Commonly known as:  ROBAXIN  Take 1 tablet (500 mg total) by mouth every 6 (six) hours as needed.     metoprolol  50 MG tablet  Commonly known as:  LOPRESSOR  Take 25 mg by mouth 2 (two) times daily.     multivitamin with minerals Tabs  Take 1 tablet by mouth daily.     oxyCODONE-acetaminophen 5-325 MG per tablet  Commonly known as:  ROXICET  1-2 tabs po q4-6hrs prn pain     Potassium 99 MG Tabs  Take 99 mg by mouth daily.     ramipril 10 MG tablet  Commonly known as:  ALTACE  Take 10 mg by mouth daily.     rosuvastatin 40 MG tablet  Commonly known as:  CRESTOR  Take 40 mg by mouth daily.     triamterene-hydrochlorothiazide 37.5-25 MG per tablet  Commonly known as:  MAXZIDE-25  Take 1 tablet by mouth daily.     vitamin E 400 UNIT capsule  Generic drug:  vitamin E  Take 400 Units by mouth daily.        FOLLOW UP VISIT:       Follow-up Information   Follow up with CAFFREY JR,W D, MD. Schedule an appointment as soon as possible for a visit in 2 weeks.   Contact information:   [address] [place] [address] [phone]       DISPOSITION:   Home  CONDITION:  Stable   Margart Sickles 02/22/2013, 8:45 AM

## 2013-02-23 ENCOUNTER — Encounter (HOSPITAL_COMMUNITY): Payer: Self-pay | Admitting: Orthopedic Surgery

## 2013-03-22 ENCOUNTER — Ambulatory Visit: Payer: BC Managed Care – PPO | Attending: Orthopedic Surgery | Admitting: Rehabilitation

## 2013-03-22 DIAGNOSIS — R609 Edema, unspecified: Secondary | ICD-10-CM | POA: Insufficient documentation

## 2013-03-22 DIAGNOSIS — IMO0001 Reserved for inherently not codable concepts without codable children: Secondary | ICD-10-CM | POA: Insufficient documentation

## 2013-03-22 DIAGNOSIS — M25569 Pain in unspecified knee: Secondary | ICD-10-CM | POA: Insufficient documentation

## 2013-03-22 DIAGNOSIS — Z96659 Presence of unspecified artificial knee joint: Secondary | ICD-10-CM | POA: Insufficient documentation

## 2013-03-22 DIAGNOSIS — M25669 Stiffness of unspecified knee, not elsewhere classified: Secondary | ICD-10-CM | POA: Insufficient documentation

## 2013-03-24 ENCOUNTER — Ambulatory Visit: Payer: BC Managed Care – PPO | Admitting: Rehabilitation

## 2013-03-25 ENCOUNTER — Ambulatory Visit: Payer: BC Managed Care – PPO | Admitting: Rehabilitation

## 2013-03-29 ENCOUNTER — Ambulatory Visit: Payer: BC Managed Care – PPO | Admitting: Rehabilitation

## 2013-03-31 ENCOUNTER — Ambulatory Visit: Payer: BC Managed Care – PPO | Admitting: Rehabilitation

## 2013-04-01 ENCOUNTER — Ambulatory Visit: Payer: BC Managed Care – PPO | Admitting: Rehabilitation

## 2013-04-05 ENCOUNTER — Ambulatory Visit: Payer: BC Managed Care – PPO | Admitting: Rehabilitation

## 2013-04-06 ENCOUNTER — Ambulatory Visit: Payer: BC Managed Care – PPO | Attending: Orthopedic Surgery | Admitting: Rehabilitation

## 2013-04-06 ENCOUNTER — Encounter: Payer: BC Managed Care – PPO | Admitting: Rehabilitation

## 2013-04-06 DIAGNOSIS — M25669 Stiffness of unspecified knee, not elsewhere classified: Secondary | ICD-10-CM | POA: Insufficient documentation

## 2013-04-06 DIAGNOSIS — Z96659 Presence of unspecified artificial knee joint: Secondary | ICD-10-CM | POA: Insufficient documentation

## 2013-04-06 DIAGNOSIS — M25569 Pain in unspecified knee: Secondary | ICD-10-CM | POA: Insufficient documentation

## 2013-04-06 DIAGNOSIS — R609 Edema, unspecified: Secondary | ICD-10-CM | POA: Insufficient documentation

## 2013-04-06 DIAGNOSIS — IMO0001 Reserved for inherently not codable concepts without codable children: Secondary | ICD-10-CM | POA: Insufficient documentation

## 2013-04-08 ENCOUNTER — Ambulatory Visit: Payer: BC Managed Care – PPO | Admitting: Rehabilitation

## 2013-04-12 ENCOUNTER — Ambulatory Visit: Payer: BC Managed Care – PPO | Admitting: Rehabilitation

## 2013-04-13 ENCOUNTER — Encounter: Payer: BC Managed Care – PPO | Admitting: Rehabilitation

## 2013-04-15 ENCOUNTER — Ambulatory Visit: Payer: BC Managed Care – PPO | Admitting: Rehabilitation

## 2013-04-19 ENCOUNTER — Ambulatory Visit: Payer: BC Managed Care – PPO | Admitting: Rehabilitation

## 2013-04-21 ENCOUNTER — Ambulatory Visit: Payer: BC Managed Care – PPO | Admitting: Rehabilitation

## 2013-04-26 ENCOUNTER — Ambulatory Visit: Payer: BC Managed Care – PPO | Admitting: Rehabilitation

## 2013-04-28 ENCOUNTER — Ambulatory Visit: Payer: BC Managed Care – PPO | Admitting: Rehabilitation

## 2013-05-06 ENCOUNTER — Ambulatory Visit: Payer: BC Managed Care – PPO | Attending: Orthopedic Surgery | Admitting: Rehabilitation

## 2013-05-06 DIAGNOSIS — M25569 Pain in unspecified knee: Secondary | ICD-10-CM | POA: Insufficient documentation

## 2013-05-06 DIAGNOSIS — IMO0001 Reserved for inherently not codable concepts without codable children: Secondary | ICD-10-CM | POA: Insufficient documentation

## 2013-05-06 DIAGNOSIS — Z96659 Presence of unspecified artificial knee joint: Secondary | ICD-10-CM | POA: Insufficient documentation

## 2013-05-06 DIAGNOSIS — M25669 Stiffness of unspecified knee, not elsewhere classified: Secondary | ICD-10-CM | POA: Insufficient documentation

## 2013-05-06 DIAGNOSIS — R609 Edema, unspecified: Secondary | ICD-10-CM | POA: Insufficient documentation

## 2013-05-11 ENCOUNTER — Ambulatory Visit: Payer: BC Managed Care – PPO | Admitting: Rehabilitation

## 2013-05-13 ENCOUNTER — Ambulatory Visit: Payer: BC Managed Care – PPO | Admitting: Rehabilitation

## 2013-05-18 ENCOUNTER — Other Ambulatory Visit (HOSPITAL_COMMUNITY): Payer: Self-pay | Admitting: Orthopedic Surgery

## 2013-05-18 DIAGNOSIS — M25561 Pain in right knee: Secondary | ICD-10-CM

## 2013-05-18 DIAGNOSIS — M25562 Pain in left knee: Secondary | ICD-10-CM

## 2013-05-19 ENCOUNTER — Encounter (HOSPITAL_COMMUNITY)
Admission: RE | Admit: 2013-05-19 | Discharge: 2013-05-19 | Disposition: A | Discharge status: Home / Self Care | Payer: BC Managed Care – PPO | Source: Ambulatory Visit | Attending: Orthopedic Surgery | Admitting: Orthopedic Surgery

## 2013-05-19 DIAGNOSIS — Z96659 Presence of unspecified artificial knee joint: Secondary | ICD-10-CM | POA: Insufficient documentation

## 2013-05-19 DIAGNOSIS — M25469 Effusion, unspecified knee: Secondary | ICD-10-CM | POA: Insufficient documentation

## 2013-05-19 DIAGNOSIS — M25561 Pain in right knee: Secondary | ICD-10-CM

## 2013-05-19 DIAGNOSIS — M25569 Pain in unspecified knee: Secondary | ICD-10-CM | POA: Insufficient documentation

## 2013-05-19 MED ORDER — TECHNETIUM TC 99M MEDRONATE IV KIT
25.7000 | PACK | Freq: Once | INTRAVENOUS | Status: AC | PRN
  Administered 2013-05-19: 25.7 via INTRAVENOUS

## 2013-06-02 DIAGNOSIS — I10 Essential (primary) hypertension: Secondary | ICD-10-CM | POA: Insufficient documentation

## 2013-06-02 DIAGNOSIS — K0510 Chronic gingivitis, plaque induced: Secondary | ICD-10-CM | POA: Insufficient documentation

## 2013-06-09 ENCOUNTER — Other Ambulatory Visit: Payer: Self-pay | Admitting: Orthopedic Surgery

## 2013-06-22 ENCOUNTER — Encounter (HOSPITAL_COMMUNITY): Payer: Self-pay | Admitting: Pharmacy Technician

## 2013-06-25 ENCOUNTER — Encounter (HOSPITAL_COMMUNITY): Payer: Self-pay

## 2013-06-25 ENCOUNTER — Encounter (HOSPITAL_COMMUNITY)
Admission: RE | Admit: 2013-06-25 | Discharge: 2013-06-25 | Disposition: A | Discharge status: Home / Self Care | Payer: BC Managed Care – PPO | Source: Ambulatory Visit | Attending: Orthopedic Surgery | Admitting: Orthopedic Surgery

## 2013-06-25 DIAGNOSIS — Z01818 Encounter for other preprocedural examination: Secondary | ICD-10-CM | POA: Insufficient documentation

## 2013-06-25 DIAGNOSIS — Z01812 Encounter for preprocedural laboratory examination: Secondary | ICD-10-CM | POA: Insufficient documentation

## 2013-06-25 LAB — BASIC METABOLIC PANEL
BUN: 21 mg/dL (ref 6–23)
GFR calc Af Amer: 83 mL/min — ABNORMAL LOW (ref >90)
GFR calc non Af Amer: 72 mL/min — ABNORMAL LOW (ref >90)
Potassium: 3.8 mEq/L (ref 3.5–5.1)

## 2013-06-25 LAB — CBC WITH DIFFERENTIAL/PLATELET
Basophils Absolute: 0 10*3/uL (ref 0.0–0.1)
Basophils Relative: 0 % (ref 0–1)
Eosinophils Relative: 2 % (ref 0–5)
Hemoglobin: 13.8 g/dL (ref 12.0–15.0)
MCHC: 32.3 g/dL (ref 30.0–36.0)
Monocytes Absolute: 0.6 10*3/uL (ref 0.1–1.0)
Monocytes Relative: 9 % (ref 3–12)
Neutro Abs: 3.8 10*3/uL (ref 1.7–7.7)
Neutrophils Relative %: 53 % (ref 43–77)
RDW: 14.8 % (ref 11.5–15.5)
WBC: 7.1 10*3/uL (ref 4.0–10.5)

## 2013-06-25 LAB — TYPE AND SCREEN: ABO/RH(D): O POS

## 2013-06-25 LAB — APTT: aPTT: 32 seconds (ref 24–37)

## 2013-06-25 LAB — URINALYSIS, ROUTINE W REFLEX MICROSCOPIC
Glucose, UA: NEGATIVE mg/dL
Ketones, ur: NEGATIVE mg/dL
Protein, ur: NEGATIVE mg/dL

## 2013-06-25 LAB — URINE MICROSCOPIC-ADD ON

## 2013-06-25 LAB — PROTIME-INR: INR: 0.99 (ref 0.00–1.49)

## 2013-06-25 NOTE — Pre-Procedure Instructions (Signed)
Alexsa Flaum  06/25/2013   Your procedure is scheduled on:  October 31  Report to [facility] Entrance "A" [address] at [employer] AM.  Call this number if you have problems the morning of surgery: [phone]   Remember:   Do not eat food or drink liquids after midnight.   Take these medicines the morning of surgery with A SIP OF WATER: Metoprolol, Oxycodone (if needed)   STOP Multiple Vitamins, Vitamin E today  Do not take Aspirin, Aleve, Naproxen, Advil, Ibuprofen, Vitamin, Herbs, or Supplements starting today   Do not wear jewelry, make-up or nail polish.  Do not wear lotions, powders, or perfumes. You may wear deodorant.  Do not shave 48 hours prior to surgery. Men may shave face and neck.  Do not bring valuables to the hospital.  [facility] is not responsible                  for any belongings or valuables.               Contacts, dentures or bridgework may not be worn into surgery.  Leave suitcase in the car. After surgery it may be brought to your room.  For patients admitted to the hospital, discharge time is determined by your                treatment team.               Special Instructions: Shower using CHG 2 nights before surgery and the night before surgery.  If you shower the day of surgery use CHG.  Use special wash - you have one bottle of CHG for all showers.  You should use approximately 1/3 of the bottle for each shower.   Please read over the following fact sheets that you were given: Pain Booklet, Coughing and Deep Breathing, Blood Transfusion Information and Surgical Site Infection Prevention

## 2013-07-01 MED ORDER — CEFAZOLIN SODIUM-DEXTROSE 2-3 GM-% IV SOLR
2.0000 g | INTRAVENOUS | Status: AC
  Administered 2013-07-02: 2 g via INTRAVENOUS
  Filled 2013-07-01: qty 50

## 2013-07-01 NOTE — H&P (Signed)
TOTAL KNEE REVISION ADMISSION H&P  Patient is being admitted for left revision total knee arthroplasty.  Subjective:  Chief Complaint:left knee pain.  HPI: Sharon Yu, 62 y.o. female, has a history of pain and functional disability in the left knee(s) due to failed previous arthroplasty and patient has failed non-surgical conservative treatments for greater than 12 weeks to include use of assistive devices and activity modification. The indications for the revision of the total knee arthroplasty are loosening of one or more components. Onset of symptoms was gradual starting 2 years ago with gradually worsening course since that time.  Prior procedures on the left knee(s) include arthroplasty.  Patient currently rates pain in the left knee(s) at 10 out of 10 with activity. There is worsening of pain with activity and weight bearing and pain that interferes with activities of daily living.  Patient has evidence of prosthetic loosening by imaging studies. This condition presents safety issues increasing the risk of falls.  There is no current active infection.  Patient is seen in consultation from Dr. Kristeen Miss for evaluation of a painful left total knee that was placed by Dr. Jonny Ruiz Rendall back on 09/15/2008.  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  She denies any fevers or chills or wound problems.  She points to the medial proximal tibia as the location of her pain.  Reviewing her x-rays from 2010 to the most recent ones it looks like she has some micromotion of the tibial component along the medial border.  Her bone scan which was done recently shows some increased uptake to the medial side of the tibial component and femoral component on the left side.  There are no active problems to display for this patient.  Past Medical History  Diagnosis Date  . Hypertension     takes Maxzide and  Metoprolol daily along with Ramipril  . Hyperlipidemia     takes Crestor daily  . Shortness of breath     with exertion  . Arthritis   . Joint pain   . Joint swelling   . Back pain     buldging disc  . Hypopotassemia     takes Potassium pill daily    Past Surgical History  Procedure Laterality Date  . Carpel tunnel      bilateral  . Cholecystectomy    . Bilateral knee arthroscopies    . Joint replacement Left   . Colonscopy    . Total knee arthroplasty Right 02/19/2013    Procedure: TOTAL KNEE ARTHROPLASTY;  Surgeon: Thera Flake., MD;  Location: [facility] OR;  Service: Orthopedics;  Laterality: Right;  right total knee arthroplasty    No prescriptions prior to admission   Allergies  Allergen Reactions  . Other Hives and Swelling    "Grapple" (hybrid apple & grape)    History  Substance Use Topics  . Smoking status: Never Smoker   . Smokeless tobacco: Not on file  . Alcohol Use: No    No family history on file.    Review of Systems  Constitutional: Negative.   HENT: Negative.   Eyes: Negative.   Respiratory: Negative.   Cardiovascular: Negative.   Gastrointestinal: Negative.   Genitourinary: Negative.   Musculoskeletal: Positive for joint pain.  Skin: Negative.   Neurological: Negative.   Endo/Heme/Allergies: Negative.   Psychiatric/Behavioral: Negative.      Objective:  Physical  Exam  Constitutional: She is oriented to person, place, and time. She appears well-developed and well-nourished.  HENT:  Head: Normocephalic and atraumatic.  Eyes: Pupils are equal, round, and reactive to light.  Neck: Normal range of motion. Neck supple.  Cardiovascular: Intact distal pulses.   Respiratory: Effort normal and breath sounds normal.  Musculoskeletal: She exhibits tenderness.  Neurological: She is alert and oriented to person, place, and time.  Skin: Skin is warm and dry.  Psychiatric: She has a normal mood and affect. Her behavior is normal. Judgment and thought  content normal.    Vital signs in last 24 hours:    Labs:  Estimated body mass index is 40.38 kg/(m^2) as calculated from the following:   Height as of 02/10/13: 5\' 7"  (1.702 m).   Weight as of 02/10/13: 116.983 kg (257 lb 14.4 oz).  Imaging Review X-rays are reviewed with the patient and do demonstrate what I believe his motion of the tibial component falling into a little bit of varus compared to x-rays a redundant 2010.  Assessment/Plan:  Assess: Left total knee arthroplasty DePuy LCS RP design with evidence of loosening over the last year or 2 by x-ray.  Patient is becoming more and more painful and is interfering with her work which does require standing and sitting.  The patient history, physical examination, clinical judgment of the provider and imaging studies are consistent with end stage degenerative joint disease of the left knee(s), previous total knee arthroplasty. Revision total knee arthroplasty is deemed medically necessary. The treatment options including medical management, injection therapy, arthroscopy and revision arthroplasty were discussed at length. The risks and benefits of revision total knee arthroplasty were presented and reviewed. The risks due to aseptic loosening, infection, stiffness, patella tracking problems, thromboembolic complications and other imponderables were discussed. The patient acknowledged the explanation, agreed to proceed with the plan and consent was signed. Patient is being admitted for inpatient treatment for surgery, pain control, PT, OT, prophylactic antibiotics, VTE prophylaxis, progressive ambulation and ADL's and discharge planning.The patient is planning to be discharged home with home health services

## 2013-07-02 ENCOUNTER — Inpatient Hospital Stay (HOSPITAL_COMMUNITY): Payer: BC Managed Care – PPO

## 2013-07-02 ENCOUNTER — Encounter (HOSPITAL_COMMUNITY)
Admission: RE | Disposition: A | Discharge status: Skilled Nursing Facility | Payer: Self-pay | Source: Ambulatory Visit | Attending: Orthopedic Surgery

## 2013-07-02 ENCOUNTER — Encounter (HOSPITAL_COMMUNITY): Payer: Self-pay | Admitting: *Deleted

## 2013-07-02 ENCOUNTER — Encounter (HOSPITAL_COMMUNITY): Payer: BC Managed Care – PPO | Admitting: Anesthesiology

## 2013-07-02 ENCOUNTER — Inpatient Hospital Stay (HOSPITAL_COMMUNITY)
Admission: RE | Admit: 2013-07-02 | Discharge: 2013-07-05 | DRG: 467 | Disposition: A | Discharge status: Skilled Nursing Facility | Payer: BC Managed Care – PPO | Source: Ambulatory Visit | Attending: Orthopedic Surgery | Admitting: Orthopedic Surgery

## 2013-07-02 ENCOUNTER — Inpatient Hospital Stay (HOSPITAL_COMMUNITY): Payer: BC Managed Care – PPO | Admitting: Anesthesiology

## 2013-07-02 DIAGNOSIS — Z6841 Body Mass Index (BMI) 40.0 and over, adult: Secondary | ICD-10-CM

## 2013-07-02 DIAGNOSIS — T84033A 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, initial encounter: Secondary | ICD-10-CM

## 2013-07-02 DIAGNOSIS — Z79899 Other long term (current) drug therapy: Secondary | ICD-10-CM

## 2013-07-02 DIAGNOSIS — I10 Essential (primary) hypertension: Secondary | ICD-10-CM | POA: Diagnosis present

## 2013-07-02 DIAGNOSIS — Z96659 Presence of unspecified artificial knee joint: Secondary | ICD-10-CM

## 2013-07-02 DIAGNOSIS — Z9089 Acquired absence of other organs: Secondary | ICD-10-CM

## 2013-07-02 DIAGNOSIS — Y831 Surgical operation with implant of artificial internal device as the cause of abnormal reaction of the patient, or of later complication, without mention of misadventure at the time of the procedure: Secondary | ICD-10-CM | POA: Diagnosis present

## 2013-07-02 DIAGNOSIS — Y92009 Unspecified place in unspecified non-institutional (private) residence as the place of occurrence of the external cause: Secondary | ICD-10-CM

## 2013-07-02 DIAGNOSIS — Z7982 Long term (current) use of aspirin: Secondary | ICD-10-CM

## 2013-07-02 DIAGNOSIS — E785 Hyperlipidemia, unspecified: Secondary | ICD-10-CM | POA: Diagnosis present

## 2013-07-02 DIAGNOSIS — T84039A Mechanical loosening of unspecified internal prosthetic joint, initial encounter: Principal | ICD-10-CM | POA: Diagnosis present

## 2013-07-02 HISTORY — PX: TOTAL KNEE REVISION: SHX996

## 2013-07-02 LAB — GRAM STAIN

## 2013-07-02 SURGERY — TOTAL KNEE REVISION
Anesthesia: General | Site: Knee | Laterality: Left | Wound class: Clean

## 2013-07-02 MED ORDER — CHLORHEXIDINE GLUCONATE 4 % EX LIQD: 60.0000 mL | Freq: Once | CUTANEOUS | Status: DC

## 2013-07-02 MED ORDER — LIDOCAINE HCL (CARDIAC) 20 MG/ML IV SOLN
INTRAVENOUS | Status: DC | PRN
  Administered 2013-07-02: 100 mg via INTRAVENOUS

## 2013-07-02 MED ORDER — RAMIPRIL 10 MG PO TABS: 10.0000 mg | ORAL_TABLET | Freq: Every day | ORAL | Status: DC

## 2013-07-02 MED ORDER — ASPIRIN EC 325 MG PO TBEC
325.0000 mg | DELAYED_RELEASE_TABLET | Freq: Every day | ORAL | Status: DC
  Administered 2013-07-03 – 2013-07-05 (×3): 325 mg via ORAL
  Filled 2013-07-02 (×4): qty 1

## 2013-07-02 MED ORDER — ALUM & MAG HYDROXIDE-SIMETH 200-200-20 MG/5ML PO SUSP: 30.0000 mL | ORAL | Status: DC | PRN

## 2013-07-02 MED ORDER — TRANEXAMIC ACID 100 MG/ML IV SOLN
1000.0000 mg | INTRAVENOUS | Status: AC
  Administered 2013-07-02: 1000 mg via INTRAVENOUS
  Filled 2013-07-02: qty 10

## 2013-07-02 MED ORDER — GLYCOPYRROLATE 0.2 MG/ML IJ SOLN
INTRAMUSCULAR | Status: DC | PRN
  Administered 2013-07-02: 0.4 mg via INTRAVENOUS

## 2013-07-02 MED ORDER — BUPIVACAINE-EPINEPHRINE PF 0.5-1:200000 % IJ SOLN
INTRAMUSCULAR | Status: DC | PRN
  Administered 2013-07-02: 30 mL

## 2013-07-02 MED ORDER — OXYCODONE HCL 5 MG PO TABS
5.0000 mg | ORAL_TABLET | Freq: Once | ORAL | Status: AC | PRN
  Administered 2013-07-02: 5 mg via ORAL

## 2013-07-02 MED ORDER — OXYCODONE HCL 5 MG PO TABS
5.0000 mg | ORAL_TABLET | ORAL | Status: DC | PRN
  Administered 2013-07-02 (×2): 5 mg via ORAL
  Administered 2013-07-03 – 2013-07-04 (×10): 10 mg via ORAL
  Administered 2013-07-05: 5 mg via ORAL
  Administered 2013-07-05 (×3): 10 mg via ORAL
  Filled 2013-07-02: qty 2
  Filled 2013-07-02: qty 1
  Filled 2013-07-02 (×11): qty 2
  Filled 2013-07-02 (×2): qty 1
  Filled 2013-07-02: qty 2

## 2013-07-02 MED ORDER — METHOCARBAMOL 500 MG PO TABS
500.0000 mg | ORAL_TABLET | Freq: Four times a day (QID) | ORAL | Status: DC | PRN
  Administered 2013-07-02 – 2013-07-04 (×6): 500 mg via ORAL
  Filled 2013-07-02 (×6): qty 1

## 2013-07-02 MED ORDER — TRIAMTERENE-HCTZ 37.5-25 MG PO TABS
1.0000 | ORAL_TABLET | Freq: Every day | ORAL | Status: DC
  Administered 2013-07-03 – 2013-07-05 (×3): 1 via ORAL
  Filled 2013-07-02 (×3): qty 1

## 2013-07-02 MED ORDER — LACTATED RINGERS IV SOLN
INTRAVENOUS | Status: DC | PRN
  Administered 2013-07-02 (×3): via INTRAVENOUS

## 2013-07-02 MED ORDER — ONDANSETRON HCL 4 MG/2ML IJ SOLN: 4.0000 mg | Freq: Once | INTRAMUSCULAR | Status: DC | PRN

## 2013-07-02 MED ORDER — RAMIPRIL 10 MG PO CAPS
10.0000 mg | ORAL_CAPSULE | Freq: Every day | ORAL | Status: DC
  Administered 2013-07-03 – 2013-07-05 (×3): 10 mg via ORAL
  Filled 2013-07-02 (×3): qty 1

## 2013-07-02 MED ORDER — DOCUSATE SODIUM 100 MG PO CAPS
100.0000 mg | ORAL_CAPSULE | Freq: Two times a day (BID) | ORAL | Status: DC
  Administered 2013-07-02 – 2013-07-05 (×6): 100 mg via ORAL
  Filled 2013-07-02 (×7): qty 1

## 2013-07-02 MED ORDER — MIDAZOLAM HCL 2 MG/2ML IJ SOLN
1.0000 mg | INTRAMUSCULAR | Status: DC | PRN
  Administered 2013-07-02: 2 mg via INTRAVENOUS

## 2013-07-02 MED ORDER — EPHEDRINE SULFATE 50 MG/ML IJ SOLN
INTRAMUSCULAR | Status: DC | PRN
  Administered 2013-07-02: 10 mg via INTRAVENOUS

## 2013-07-02 MED ORDER — MIDAZOLAM HCL 2 MG/2ML IJ SOLN
INTRAMUSCULAR | Status: AC
  Administered 2013-07-02: 2 mg via INTRAVENOUS
  Filled 2013-07-02: qty 2

## 2013-07-02 MED ORDER — METOPROLOL TARTRATE 25 MG PO TABS
25.0000 mg | ORAL_TABLET | Freq: Two times a day (BID) | ORAL | Status: DC
  Administered 2013-07-02 – 2013-07-04 (×4): 25 mg via ORAL
  Filled 2013-07-02 (×7): qty 1

## 2013-07-02 MED ORDER — METOCLOPRAMIDE HCL 5 MG/ML IJ SOLN: 5.0000 mg | Freq: Three times a day (TID) | INTRAMUSCULAR | Status: DC | PRN

## 2013-07-02 MED ORDER — ONDANSETRON HCL 4 MG/2ML IJ SOLN
INTRAMUSCULAR | Status: DC | PRN
  Administered 2013-07-02: 4 mg via INTRAVENOUS

## 2013-07-02 MED ORDER — FENTANYL CITRATE 0.05 MG/ML IJ SOLN
50.0000 ug | INTRAMUSCULAR | Status: DC | PRN
  Administered 2013-07-02: 50 ug via INTRAVENOUS

## 2013-07-02 MED ORDER — CEFUROXIME SODIUM 1.5 G IJ SOLR
INTRAMUSCULAR | Status: DC | PRN
  Administered 2013-07-02: 1.5 g

## 2013-07-02 MED ORDER — MENTHOL 3 MG MT LOZG: 1.0000 | LOZENGE | OROMUCOSAL | Status: DC | PRN

## 2013-07-02 MED ORDER — SODIUM CHLORIDE 0.9 % IR SOLN
Status: DC | PRN
  Administered 2013-07-02: 3000 mL

## 2013-07-02 MED ORDER — OXYCODONE HCL 5 MG/5ML PO SOLN: 5.0000 mg | Freq: Once | ORAL | Status: AC | PRN

## 2013-07-02 MED ORDER — NEOSTIGMINE METHYLSULFATE 1 MG/ML IJ SOLN
INTRAMUSCULAR | Status: DC | PRN
  Administered 2013-07-02: 3 mg via INTRAVENOUS

## 2013-07-02 MED ORDER — FENTANYL CITRATE 0.05 MG/ML IJ SOLN
INTRAMUSCULAR | Status: DC | PRN
  Administered 2013-07-02 (×2): 50 ug via INTRAVENOUS
  Administered 2013-07-02: 100 ug via INTRAVENOUS
  Administered 2013-07-02 (×3): 50 ug via INTRAVENOUS

## 2013-07-02 MED ORDER — POTASSIUM 99 MG PO TABS: 99.0000 mg | ORAL_TABLET | Freq: Every day | ORAL | Status: DC

## 2013-07-02 MED ORDER — ACETAMINOPHEN 325 MG PO TABS
650.0000 mg | ORAL_TABLET | Freq: Four times a day (QID) | ORAL | Status: DC | PRN
  Administered 2013-07-03 – 2013-07-04 (×2): 650 mg via ORAL
  Filled 2013-07-02 (×2): qty 2

## 2013-07-02 MED ORDER — FENTANYL CITRATE 0.05 MG/ML IJ SOLN
INTRAMUSCULAR | Status: AC
  Administered 2013-07-02: 50 ug via INTRAVENOUS
  Filled 2013-07-02: qty 2

## 2013-07-02 MED ORDER — METOCLOPRAMIDE HCL 10 MG PO TABS: 5.0000 mg | ORAL_TABLET | Freq: Three times a day (TID) | ORAL | Status: DC | PRN

## 2013-07-02 MED ORDER — ROCURONIUM BROMIDE 100 MG/10ML IV SOLN
INTRAVENOUS | Status: DC | PRN
  Administered 2013-07-02: 50 mg via INTRAVENOUS

## 2013-07-02 MED ORDER — KCL IN DEXTROSE-NACL 20-5-0.45 MEQ/L-%-% IV SOLN
INTRAVENOUS | Status: DC
  Administered 2013-07-02 – 2013-07-03 (×3): via INTRAVENOUS
  Filled 2013-07-02 (×11): qty 1000

## 2013-07-02 MED ORDER — HYDROMORPHONE HCL PF 1 MG/ML IJ SOLN
INTRAMUSCULAR | Status: AC
  Filled 2013-07-02: qty 2

## 2013-07-02 MED ORDER — MAGNESIUM CITRATE PO SOLN
1.0000 | Freq: Once | ORAL | Status: AC | PRN
Start: 2013-07-02 — End: 2013-07-02
  Filled 2013-07-02: qty 296

## 2013-07-02 MED ORDER — ALENDRONATE SODIUM 70 MG PO TABS: 70.0000 mg | ORAL_TABLET | ORAL | Status: DC

## 2013-07-02 MED ORDER — BISACODYL 5 MG PO TBEC: 5.0000 mg | DELAYED_RELEASE_TABLET | Freq: Every day | ORAL | Status: DC | PRN

## 2013-07-02 MED ORDER — SENNOSIDES-DOCUSATE SODIUM 8.6-50 MG PO TABS: 1.0000 | ORAL_TABLET | Freq: Every evening | ORAL | Status: DC | PRN

## 2013-07-02 MED ORDER — ONDANSETRON HCL 4 MG/2ML IJ SOLN
4.0000 mg | Freq: Four times a day (QID) | INTRAMUSCULAR | Status: DC | PRN
  Administered 2013-07-03: 4 mg via INTRAVENOUS
  Filled 2013-07-02: qty 2

## 2013-07-02 MED ORDER — OXYCODONE HCL 5 MG PO TABS
ORAL_TABLET | ORAL | Status: AC
  Filled 2013-07-02: qty 1

## 2013-07-02 MED ORDER — PROPOFOL 10 MG/ML IV BOLUS
INTRAVENOUS | Status: DC | PRN
  Administered 2013-07-02: 130 mg via INTRAVENOUS

## 2013-07-02 MED ORDER — CEFUROXIME SODIUM 1.5 G IJ SOLR
INTRAMUSCULAR | Status: AC
  Filled 2013-07-02: qty 1.5

## 2013-07-02 MED ORDER — METHOCARBAMOL 100 MG/ML IJ SOLN
500.0000 mg | Freq: Four times a day (QID) | INTRAVENOUS | Status: DC | PRN
  Filled 2013-07-02: qty 5

## 2013-07-02 MED ORDER — BUPIVACAINE LIPOSOME 1.3 % IJ SUSP
20.0000 mL | INTRAMUSCULAR | Status: DC
  Filled 2013-07-02: qty 20

## 2013-07-02 MED ORDER — MEPERIDINE HCL 25 MG/ML IJ SOLN: 6.2500 mg | INTRAMUSCULAR | Status: DC | PRN

## 2013-07-02 MED ORDER — SODIUM CHLORIDE 0.9 % IJ SOLN
INTRAMUSCULAR | Status: DC | PRN
  Administered 2013-07-02: 15:00:00

## 2013-07-02 MED ORDER — HYDROMORPHONE HCL PF 1 MG/ML IJ SOLN
0.2500 mg | INTRAMUSCULAR | Status: DC | PRN
  Administered 2013-07-02 (×4): 0.5 mg via INTRAVENOUS

## 2013-07-02 MED ORDER — DEXTROSE-NACL 5-0.45 % IV SOLN: INTRAVENOUS | Status: DC

## 2013-07-02 MED ORDER — PHENOL 1.4 % MT LIQD: 1.0000 | OROMUCOSAL | Status: DC | PRN

## 2013-07-02 MED ORDER — PHENYLEPHRINE HCL 10 MG/ML IJ SOLN
INTRAMUSCULAR | Status: DC | PRN
  Administered 2013-07-02 (×2): 40 ug via INTRAVENOUS

## 2013-07-02 MED ORDER — DIPHENHYDRAMINE HCL 12.5 MG/5ML PO ELIX: 12.5000 mg | ORAL_SOLUTION | ORAL | Status: DC | PRN

## 2013-07-02 MED ORDER — ONDANSETRON HCL 4 MG PO TABS: 4.0000 mg | ORAL_TABLET | Freq: Four times a day (QID) | ORAL | Status: DC | PRN

## 2013-07-02 MED ORDER — HYDROMORPHONE HCL PF 1 MG/ML IJ SOLN
0.5000 mg | INTRAMUSCULAR | Status: DC | PRN
  Administered 2013-07-03 – 2013-07-04 (×6): 0.5 mg via INTRAVENOUS
  Filled 2013-07-02 (×6): qty 1

## 2013-07-02 MED ORDER — LACTATED RINGERS IV SOLN
INTRAVENOUS | Status: DC
  Administered 2013-07-02: 10:00:00 via INTRAVENOUS

## 2013-07-02 MED ORDER — ACETAMINOPHEN 650 MG RE SUPP: 650.0000 mg | Freq: Four times a day (QID) | RECTAL | Status: DC | PRN

## 2013-07-02 SURGICAL SUPPLY — 63 items
BANDAGE ELASTIC 4 VELCRO ST LF (GAUZE/BANDAGES/DRESSINGS) ×2 IMPLANT
BANDAGE ELASTIC 6 VELCRO ST LF (GAUZE/BANDAGES/DRESSINGS) ×2 IMPLANT
BANDAGE ESMARK 6X9 LF (GAUZE/BANDAGES/DRESSINGS) ×1 IMPLANT
BLADE SAG 18X100X1.27 (BLADE) ×2 IMPLANT
BLADE SAW SAG 90X13X1.27 (BLADE) ×2 IMPLANT
BLADE SURG ROTATE 9660 (MISCELLANEOUS) IMPLANT
BNDG ESMARK 6X9 LF (GAUZE/BANDAGES/DRESSINGS) ×2
BOWL SMART MIX CTS (DISPOSABLE) ×2 IMPLANT
CEMENT HV SMART SET (Cement) ×4 IMPLANT
CLOTH BEACON ORANGE TIMEOUT ST (SAFETY) ×2 IMPLANT
COVER BACK TABLE 24X17X13 BIG (DRAPES) IMPLANT
COVER SURGICAL LIGHT HANDLE (MISCELLANEOUS) ×2 IMPLANT
CUFF TOURNIQUET SINGLE 34IN LL (TOURNIQUET CUFF) ×2 IMPLANT
CUFF TOURNIQUET SINGLE 44IN (TOURNIQUET CUFF) IMPLANT
DISC DIAMOND MED (BURR) IMPLANT
DRAPE EXTREMITY T 121X128X90 (DRAPE) ×2 IMPLANT
DRAPE U-SHAPE 47X51 STRL (DRAPES) ×2 IMPLANT
DURAPREP 26ML APPLICATOR (WOUND CARE) ×2 IMPLANT
ELECT REM PT RETURN 9FT ADLT (ELECTROSURGICAL) ×2
ELECTRODE REM PT RTRN 9FT ADLT (ELECTROSURGICAL) ×1 IMPLANT
EVACUATOR 1/8 PVC DRAIN (DRAIN) ×2 IMPLANT
GAUZE XEROFORM 1X8 LF (GAUZE/BANDAGES/DRESSINGS) ×4 IMPLANT
GLOVE BIO SURGEON STRL SZ7.5 (GLOVE) ×2 IMPLANT
GLOVE BIO SURGEON STRL SZ8.5 (GLOVE) ×4 IMPLANT
GLOVE BIOGEL PI IND STRL 8 (GLOVE) ×2 IMPLANT
GLOVE BIOGEL PI IND STRL 9 (GLOVE) ×1 IMPLANT
GLOVE BIOGEL PI INDICATOR 8 (GLOVE) ×2
GLOVE BIOGEL PI INDICATOR 9 (GLOVE) ×1
GOWN PREVENTION PLUS XLARGE (GOWN DISPOSABLE) ×2 IMPLANT
GOWN STRL NON-REIN LRG LVL3 (GOWN DISPOSABLE) ×2 IMPLANT
GOWN STRL REIN XL XLG (GOWN DISPOSABLE) ×4 IMPLANT
HANDPIECE INTERPULSE COAX TIP (DISPOSABLE) ×1
HOOD PEEL AWAY FACE SHEILD DIS (HOOD) ×6 IMPLANT
INSERT LCS 3 PEG STD (Knees) ×2 IMPLANT
INSERT TIB LCS RP STD+ 12.5 (Knees) ×2 IMPLANT
KIT BASIN OR (CUSTOM PROCEDURE TRAY) ×2 IMPLANT
KIT ROOM TURNOVER OR (KITS) ×2 IMPLANT
MANIFOLD NEPTUNE II (INSTRUMENTS) ×2 IMPLANT
NS IRRIG 1000ML POUR BTL (IV SOLUTION) ×2 IMPLANT
PACK TOTAL JOINT (CUSTOM PROCEDURE TRAY) ×2 IMPLANT
PAD ARMBOARD 7.5X6 YLW CONV (MISCELLANEOUS) ×4 IMPLANT
PAD CAST 4YDX4 CTTN HI CHSV (CAST SUPPLIES) ×1 IMPLANT
PADDING CAST COTTON 4X4 STRL (CAST SUPPLIES) ×1
PADDING CAST COTTON 6X4 STRL (CAST SUPPLIES) ×2 IMPLANT
RASP HELIOCORDIAL MED (MISCELLANEOUS) IMPLANT
SET HNDPC FAN SPRY TIP SCT (DISPOSABLE) ×1 IMPLANT
SPONGE GAUZE 4X4 12PLY (GAUZE/BANDAGES/DRESSINGS) ×4 IMPLANT
STAPLER VISISTAT 35W (STAPLE) ×2 IMPLANT
STEM  REV  115X14 (Stem) ×1 IMPLANT
STEM REV 115X14 (Stem) ×1 IMPLANT
SUCTION FRAZIER TIP 10 FR DISP (SUCTIONS) ×2 IMPLANT
SUT VIC AB 0 CT1 27 (SUTURE) ×1
SUT VIC AB 0 CT1 27XBRD ANBCTR (SUTURE) ×1 IMPLANT
SUT VIC AB 1 CTX 36 (SUTURE) ×1
SUT VIC AB 1 CTX36XBRD ANBCTR (SUTURE) ×1 IMPLANT
SUT VIC AB 2-0 CT1 27 (SUTURE) ×1
SUT VIC AB 2-0 CT1 TAPERPNT 27 (SUTURE) ×1 IMPLANT
TOWEL OR 17X24 6PK STRL BLUE (TOWEL DISPOSABLE) ×2 IMPLANT
TOWEL OR 17X26 10 PK STRL BLUE (TOWEL DISPOSABLE) ×2 IMPLANT
TRAY FOLEY CATH 14FR (SET/KITS/TRAYS/PACK) ×2 IMPLANT
TRAY REVISION SZ 4 (Knees) ×2 IMPLANT
TUBE ANAEROBIC SPECIMEN COL (MISCELLANEOUS) IMPLANT
WATER STERILE IRR 1000ML POUR (IV SOLUTION) ×6 IMPLANT

## 2013-07-02 NOTE — Anesthesia Procedure Notes (Addendum)
Anesthesia Regional Block:  Femoral nerve block  Pre-Anesthetic Checklist: ,, timeout performed, Correct Patient, Correct Site, Correct Laterality, Correct Procedure, Correct Position, site marked, Risks and benefits discussed,  Surgical consent,  Pre-op evaluation,  At surgeon's request and post-op pain management  Laterality: Left  Prep: chloraprep       Needles:  Injection technique: Single-shot  Needle Type: Echogenic Stimulator Needle      Needle Gauge: 21 and 21 G    Additional Needles:  Procedures: ultrasound guided (picture in chart) and nerve stimulator Femoral nerve block  Nerve Stimulator or Paresthesia:  Response: 0.4 mA,   Additional Responses:   Narrative:  Start time: 07/02/2013 9:50 AM End time: 07/02/2013 10:05 AM Injection made incrementally with aspirations every 5 mL.  Performed by: Personally  Anesthesiologist: Arta Bruce MD  Additional Notes: Monitors applied. Patient sedated. Sterile prep and drape,hand hygiene and sterile gloves were used. Relevant anatomy identified.Needle position confirmed.Local anesthetic injected incrementally after negative aspiration. Local anesthetic spread visualized around nerve(s). Vascular puncture avoided. No complications. Image printed for medical record.The patient tolerated the procedure well.       Femoral nerve block Procedure Name: Intubation Date/Time: 07/02/2013 1:20 PM Performed by: Romie Minus K Pre-anesthesia Checklist: Patient identified, Emergency Drugs available, Suction available, Patient being monitored and Timeout performed Patient Re-evaluated:Patient Re-evaluated prior to inductionOxygen Delivery Method: Circle system utilized Preoxygenation: Pre-oxygenation with 100% oxygen Intubation Type: IV induction Ventilation: Mask ventilation without difficulty Laryngoscope Size: Miller and 2 Grade View: Grade I Tube type: Oral Tube size: 7.5 mm Number of attempts: 1 Airway Equipment and  Method: Stylet Placement Confirmation: ETT inserted through vocal cords under direct vision,  positive ETCO2,  CO2 detector and breath sounds checked- equal and bilateral Secured at: 21 cm Tube secured with: Tape Dental Injury: Teeth and Oropharynx as per pre-operative assessment

## 2013-07-02 NOTE — Interval H&P Note (Signed)
History and Physical Interval Note:  07/02/2013 12:45 PM  Sharon Yu  has presented today for surgery, with the diagnosis of LOOSE LEFT TOTAL KNEE ARTHROPLASTY  The various methods of treatment have been discussed with the patient and family. After consideration of risks, benefits and other options for treatment, the patient has consented to  Procedure(s): LEFT TOTAL KNEE REVISION (Left) as a surgical intervention .  The patient's history has been reviewed, patient examined, no change in status, stable for surgery.  I have reviewed the patient's chart and labs.  Questions were answered to the patient's satisfaction.     Nestor Lewandowsky

## 2013-07-02 NOTE — Anesthesia Postprocedure Evaluation (Signed)
  Anesthesia Post-op Note  Patient: Sharon Yu  Procedure(s) Performed: Procedure(s): LEFT TOTAL KNEE TIBIA REVISION (Left)  Patient Location: PACU  Anesthesia Type:General  Level of Consciousness: awake  Airway and Oxygen Therapy: Patient Spontanous Breathing  Post-op Pain: mild  Post-op Assessment: Post-op Vital signs reviewed  Post-op Vital Signs: Reviewed  Complications: No apparent anesthesia complications

## 2013-07-02 NOTE — Preoperative (Signed)
Beta Blockers   Reason not to administer Beta Blockers:Not Applicable 

## 2013-07-02 NOTE — Transfer of Care (Signed)
Immediate Anesthesia Transfer of Care Note  Patient: Sharon Yu  Procedure(s) Performed: Procedure(s): LEFT TOTAL KNEE TIBIA REVISION (Left)  Patient Location: PACU  Anesthesia Type:General  Level of Consciousness: awake, alert , oriented and patient cooperative  Airway & Oxygen Therapy: Patient Spontanous Breathing and Patient connected to nasal cannula oxygen  Post-op Assessment: Report given to PACU RN and Post -op Vital signs reviewed and stable  Post vital signs: Reviewed  Complications: No apparent anesthesia complications

## 2013-07-02 NOTE — Anesthesia Preprocedure Evaluation (Addendum)
Anesthesia Evaluation  Patient identified by MRN, date of birth, ID band Patient awake    Reviewed: Allergy & Precautions, H&P , NPO status , Patient's Chart, lab work & pertinent test results, reviewed documented beta blocker date and time   History of Anesthesia Complications Negative for: history of anesthetic complications  Airway Mallampati: II TM Distance: >3 FB Neck ROM: Full    Dental  (+) Teeth Intact and Dental Advisory Given   Pulmonary  breath sounds clear to auscultation        Cardiovascular hypertension, Pt. on medications and Pt. on home beta blockers Rhythm:Regular Rate:Normal     Neuro/Psych negative neurological ROS  negative psych ROS   GI/Hepatic negative GI ROS, Neg liver ROS,   Endo/Other  Morbid obesity  Renal/GU negative Renal ROS     Musculoskeletal   Abdominal   Peds  Hematology negative hematology ROS (+)   Anesthesia Other Findings   Reproductive/Obstetrics negative OB ROS                          Anesthesia Physical Anesthesia Plan  ASA: III  Anesthesia Plan: General   Post-op Pain Management:    Induction: Intravenous  Airway Management Planned: LMA and Oral ETT  Additional Equipment:   Intra-op Plan:   Post-operative Plan: Extubation in OR  Informed Consent: I have reviewed the patients History and Physical, chart, labs and discussed the procedure including the risks, benefits and alternatives for the proposed anesthesia with the patient or authorized representative who has indicated his/her understanding and acceptance.     Plan Discussed with: CRNA and Surgeon  Anesthesia Plan Comments:        Anesthesia Quick Evaluation

## 2013-07-02 NOTE — Op Note (Signed)
PATIENT ID:      Sharon Yu  MRN:     [id number] DOB/AGE:    [birth year] / 62 y.o.       OPERATIVE REPORT    DATE OF PROCEDURE:  07/02/2013       PREOPERATIVE DIAGNOSIS:   LOOSE LEFT TOTAL KNEE ARTHROPLASTY      Estimated body mass index is 40.38 kg/(m^2) as calculated from the following:   Height as of 06/25/13: 5\' 7"  (1.702 m).   Weight as of 02/10/13: 116.983 kg (257 lb 14.4 oz).                                                        POSTOPERATIVE DIAGNOSIS:   loose left total knee arthroplasty                                                                      PROCEDURE:  Procedure(s): LEFT TOTAL KNEE TIBIA REVISION Using Depuy Revision implants 4Tibia, 14x115 stem, 15mm LCS RP bearing, Standard plus Patella     SURGEON: Jenna Routzahn Yu    ASSISTANT:   Eric K. Gaylene Brooks  (present throughout entire procedure and necessary for timely completion of the procedure)   ANESTHESIA: GET with Femoral Nerve Block  DRAINS: foley, 2 medium hemovac in knee   TOURNIQUET TIME:   COMPLICATIONS:  None     SPECIMENS: None   INDICATIONS FOR PROCEDURE: Status post DePuy LCS RP total knee by Dr. Bard Herbert 4 years ago. 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 I then evaluated her and concurred with that diagnosis her pain has been increasing causes her to limp and is now starting to cause her to wake up at night. X-rays do show a very shift to the tibial component the femoral component appears to be in good position with no loosening in the metal backed patella which is a try PEG also appears to be in good shape. Patient denies any fevers or chills or systemic illness associated with the left knee pain are primarily occurs with weightbearing.  She desires elective revision of all loose components, all questions have been encouraged and  answered.   Specimens: Tissue is sent for Gram stain and culture intraoperative it did not appear to be infected.    DESCRIPTION OF PROCEDURE: The patient identified by armband, received  right femoral nerve block and IV antibiotics, in the holding area at [facility]. Patient taken to the operating room, appropriate anesthetic  monitors were attached General endotracheal anesthesia induced with  the patient in supine position, Foley catheter was inserted. Tourniquet  applied high to the operative thigh. Lateral post and foot positioner  applied to the table, the lower extremity was then prepped and draped  in usual sterile fashion from the ankle to the tourniquet. Time-out procedure was performed. In order to facilitate a thorough debridement we began the operation without using the tourniquet. We  began the operation by making the anterior midline incision starting at handbreadth above the patella going over the patella 1 cm medial to and  6 cm distal to the tibial tubercle. Small bleeders in the skin and the  subcutaneous tissue identified and cauterized. Transverse retinaculum was incised and reflected medially and a medial parapatellar arthrotomy was accomplished. the patella was everted and prepatellar scar tissue was excised with the electrocautery. The superficial medial collateral  ligament was then elevated from anterior to posterior along the proximal  flare of the tibia.  We continued to  work our way around posteriorly along the proximal tibia, and externally  rotated the tibia subluxing it out from underneath the femur. A McHale  retractor was placed through the notch and a lateral Photographer.after clearing all soft tissue from around the rim of the tibial component it was gently tapped with a half-inch osteotome and was obviously loose and removed. We set about removing the cement mantle going into the flare of the tibia using quarter-inch osteotome straight and  curved. After the cement had been removed we then reamed up to a 14 mm reamer to the appropriate depth for 1:15 stem left of the reamer and the canal and placed a collar around it the center the reamer. We then attached the 2 posterior slope cutting guide and made a small cleanup cut to the proximal tibia which had a good rim. At this time we assess the condition of the femoral component which is not loose and tested with the slap hammer the patellar bearing was removed and the backing was noted to be in good condition with no evidence of loosening. The knee was then once again hyperflexed exposing the proximal tibia and at the trial #4 MBT tibial base plate with a 14 x 115 stem was hammered into place. We inserted a 15 mm and MBT high post bearing, reduced the total knee and took her range of motion from 0-130 with good ligamentous stability.  At this point, all trial components were removed, a double batch of DePuy HV cement with 1500 mg of Zinacef was mixed and applied to all bony metallic mating surfaces. We  hammered into place the tibial tray and stem and removed excess cement, a 15 mm TC 3 RP bearing  was inserted, and the knee brought to full extension with compression.  The standard plus patellar button was clamped into place, and excess cement  removed. While the cement cured the wound was irrigated out with normal saline solution pulse lavage, and medium Hemovac drains were placed from an anterolateral  approach. Ligament stability and patellar tracking were checked and found to be excellent. The parapatellar arthrotomy was closed with  running #1 Vicryl suture. The subcutaneous tissue with 0 and 2-0 undyed  Vicryl suture, and the skin with skin staples. A dressing of Xeroform,  4 x 4, dressing sponges, Webril, and Ace wrap applied. The patient  awakened, extubated, and taken to recovery room without difficulty.  Sharon Yu 07/02/2013, 3:17 PM

## 2013-07-03 LAB — CBC
Hemoglobin: 11.2 g/dL — ABNORMAL LOW (ref 12.0–15.0)
MCH: 27.5 pg (ref 26.0–34.0)
MCV: 85.3 fL (ref 78.0–100.0)
Platelets: 135 10*3/uL — ABNORMAL LOW (ref 150–400)
RBC: 4.07 MIL/uL (ref 3.87–5.11)
WBC: 9.6 10*3/uL (ref 4.0–10.5)

## 2013-07-03 NOTE — Evaluation (Signed)
Occupational Therapy Evaluation Patient Details Name: Sharon Yu MRN: [id number] DOB: 02/17/1951 Today's Date: 07/03/2013 Time: 0865-7846 OT Time Calculation (min): 25 min  OT Assessment / Plan / Recommendation History of present illness Pt. underwent revision of tibial and patellar components for looseneing  of TKA.   Clinical Impression   Pt s/p left total knee tibia revision.  Will benefit from continued acute OT services to address below problem list.  Recommend SNF for d/c planning to further progress rehab before eventual return home.    OT Assessment  Patient needs continued OT Services    Follow Up Recommendations  SNF;Supervision/Assistance - 24 hour    Barriers to Discharge Decreased caregiver support Pt's spouse works during day. Pt will not have assist during the day.  Equipment Recommendations  None recommended by OT    Recommendations for Other Services    Frequency  Min 2X/week    Precautions / Restrictions Precautions Precautions: Knee Precaution Comments: Pt. educated on knee precautions and ankle pumps/quad sets Restrictions Weight Bearing Restrictions: Yes LLE Weight Bearing: Weight bearing as tolerated   Pertinent Vitals/Pain See vitals    ADL  Eating/Feeding: Performed;Independent Where Assessed - Eating/Feeding: Edge of bed Upper Body Bathing: Simulated;Supervision/safety Where Assessed - Upper Body Bathing: Unsupported sitting Lower Body Bathing: Simulated;+2 Total assistance Lower Body Bathing: Patient Percentage: 60% Where Assessed - Lower Body Bathing: Supported sit to stand Upper Body Dressing: Performed;Supervision/safety Where Assessed - Upper Body Dressing: Unsupported sitting Lower Body Dressing: Simulated;+2 Total assistance Lower Body Dressing: Patient Percentage: 60% Where Assessed - Lower Body Dressing: Supported sit to stand Toilet Transfer: Simulated;+2 Total assistance Toilet Transfer: Patient Percentage: 60% Doctor, general practice Method: Sit to Barista:  (bed>ambulate in room>sit in recliner) Equipment Used: Gait belt;Rolling walker Transfers/Ambulation Related to ADLs: Min assist with RW. ADL Comments: Incr time due to pain.    OT Diagnosis: Generalized weakness;Acute pain  OT Problem List: Decreased strength;Decreased activity tolerance;Decreased knowledge of use of DME or AE;Pain OT Treatment Interventions: Self-care/ADL training;DME and/or AE instruction;Therapeutic activities;Patient/family education   OT Goals(Current goals can be found in the care plan section) Acute Rehab OT Goals Patient Stated Goal: wants to return to independence and possibly work enviornment OT Goal Formulation: With patient Time For Goal Achievement: 07/10/13 Potential to Achieve Goals: Good  Visit Information  Last OT Received On: 07/03/13 Assistance Needed: +2 PT/OT Co-Evaluation/Treatment: Yes History of Present Illness: Pt. underwent revision of tibial and patellar components for looseneing  of TKA.       Prior Functioning     Home Living Family/patient expects to be discharged to:: Skilled nursing facility Living Arrangements: Spouse/significant other Additional Comments: walked in the community with cane Prior Function Level of Independence: Independent with assistive device(s) Comments: Conservation officer, nature in [employer] Communication Communication: No difficulties         Vision/Perception     Cognition  Cognition Arousal/Alertness: Awake/alert Behavior During Therapy: WFL for tasks assessed/performed Overall Cognitive Status: Within Functional Limits for tasks assessed    Extremity/Trunk Assessment Upper Extremity Assessment Upper Extremity Assessment: Overall WFL for tasks assessed Lower Extremity Assessment Lower Extremity Assessment: LLE deficits/detail LLE Deficits / Details: good ankle pumps, fair quad set LLE: Unable to fully assess due to pain     Mobility Bed  Mobility Bed Mobility: Supine to Sit;Sitting - Scoot to Edge of Bed Supine to Sit: 4: Min assist;HOB elevated;With rails Sitting - Scoot to Edge of Bed: 5: Supervision Details for Bed Mobility Assistance:  min assist to move L LE to edge of bed, cues for technique and safety Transfers Transfers: Sit to Stand;Stand to Sit Sit to Stand: 1: +2 Total assist;With upper extremity assist;From bed Sit to Stand: Patient Percentage: 60% Stand to Sit: 1: +2 Total assist;To chair/3-in-1;With armrests;With upper extremity assist Stand to Sit: Patient Percentage: 60% Details for Transfer Assistance: VCs for safe hand placment and technique.  Assist for  power up from bed and for steadyign while transitioning UEs from bed to RW.     Exercise   Balance Balance Balance Assessed: Yes Dynamic Standing Balance Dynamic Standing - Balance Support: Bilateral upper extremity supported;During functional activity Dynamic Standing - Level of Assistance: 4: Min assist   End of Session OT - End of Session Equipment Utilized During Treatment: Gait belt;Rolling walker Activity Tolerance: Patient tolerated treatment well Patient left: in chair;with call bell/phone within reach;with family/visitor present Nurse Communication: Mobility status  GO   07/03/2013 Cipriano Mile OTR/L Pager [phone] Office [phone]   Cipriano Mile 07/03/2013, 12:15 PM

## 2013-07-03 NOTE — Evaluation (Signed)
Physical Therapy Evaluation Patient Details Name: Sharon Yu MRN: [id number] DOB: 02-12-1951 Today's Date: 07/03/2013 Time: 4782-9562 PT Time Calculation (min): 21 min  PT Assessment / Plan / Recommendation History of Present Illness  Pt. underwent revision of tibial and patellar components for looseneing  of TKA.  Clinical Impression  This patient underwent a left TKA revision  and presents to PT with anticipated post-op decrease in strength and ROM, decreased functional mobility and gait.  Pt. Will benefit from acute PT to address these and below issues.  She will benefit from post acute SNF for rehab prior to DC home to reach her maximum independence funcitonally and safely.     PT Assessment  Patient needs continued PT services    Follow Up Recommendations  SNF;Supervision/Assistance - 24 hour    Does the patient have the potential to tolerate intense rehabilitation      Barriers to Discharge Decreased caregiver support      Equipment Recommendations  None recommended by PT    Recommendations for Other Services     Frequency 7X/week    Precautions / Restrictions Precautions Precautions: Knee Precaution Comments: Pt. educated on knee precautions and ankle pumps/quad sets Restrictions Weight Bearing Restrictions: Yes LLE Weight Bearing: Weight bearing as tolerated   Pertinent Vitals/Pain See vitals tab       Mobility  Bed Mobility Bed Mobility: Supine to Sit;Sitting - Scoot to Edge of Bed Supine to Sit: 4: Min assist;HOB elevated;With rails Sitting - Scoot to Edge of Bed: 5: Supervision Details for Bed Mobility Assistance: min assist to move L LE to edge of bed, cues for technique and safety Transfers Transfers: Sit to Stand;Stand to Sit Sit to Stand: 1: +2 Total assist;With upper extremity assist;From bed Sit to Stand: Patient Percentage: 60% Stand to Sit: 1: +2 Total assist;To chair/3-in-1;With armrests;With upper extremity assist Stand to Sit: Patient  Percentage: 60% Details for Transfer Assistance: VCs for safe hand placment and technique.  Assist for  power up from bed and for steadyign while transitioning UEs from bed to RW. Ambulation/Gait Ambulation/Gait Assistance: 4: Min assist Ambulation Distance (Feet): 10 Feet Assistive device: Rolling walker Ambulation/Gait Assistance Details: cues for technqiue and sequence Gait Pattern: Step-to pattern;Decreased step length - right;Decreased step length - left;Antalgic Gait velocity: decreased Stairs: No    Exercises Total Joint Exercises Ankle Circles/Pumps: AROM;Both;10 reps;Supine Quad Sets: AROM;Both;10 reps;Supine Knee Flexion: AAROM;Left;5 reps;Seated Goniometric ROM: 0 to 45   PT Diagnosis: Difficulty walking;Abnormality of gait;Acute pain  PT Problem List: Decreased strength;Decreased range of motion;Decreased activity tolerance;Decreased balance;Decreased mobility;Decreased knowledge of use of DME;Decreased knowledge of precautions;Pain;Obesity PT Treatment Interventions: DME instruction;Gait training;Functional mobility training;Therapeutic activities;Therapeutic exercise;Balance training;Patient/family education     PT Goals(Current goals can be found in the care plan section) Acute Rehab PT Goals Patient Stated Goal: wants to return to independence and possibly work enviornment PT Goal Formulation: With patient Time For Goal Achievement: 07/10/13 Potential to Achieve Goals: Good  Visit Information  Last PT Received On: 07/03/13 Assistance Needed: +2 PT/OT Co-Evaluation/Treatment: Yes History of Present Illness: Pt. underwent revision of tibial and patellar components for looseneing  of TKA.       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    Cognition  Cognition Arousal/Alertness: Awake/alert Behavior During  Therapy: WFL for tasks assessed/performed Overall Cognitive Status: Within Functional Limits for tasks assessed    Extremity/Trunk Assessment Upper Extremity Assessment Upper Extremity Assessment: Overall WFL for tasks assessed Lower Extremity Assessment Lower Extremity Assessment: LLE deficits/detail LLE Deficits / Details: good ankle pumps, fair quad set LLE: Unable to fully assess due to pain   Balance Balance Balance Assessed: Yes Dynamic Standing Balance Dynamic Standing - Balance Support: Bilateral upper extremity supported;During functional activity Dynamic Standing - Level of Assistance: 4: Min assist  End of Session PT - End of Session Equipment Utilized During Treatment: Gait belt Activity Tolerance: Patient tolerated treatment well;Patient limited by fatigue;Patient limited by pain Patient left: in chair;with call bell/phone within reach;with family/visitor present Nurse Communication: Mobility status  GP     Ferman Hamming 07/03/2013, 9:43 AM Weldon Picking PT Acute Rehab Services [phone] [place] [phone]

## 2013-07-03 NOTE — Progress Notes (Signed)
Clinical Social Work Department BRIEF PSYCHOSOCIAL ASSESSMENT 07/03/2013  Patient:  Sharon Yu, Sharon Yu     Account Number:  [id number]     Admit date:  07/02/2013  Clinical Social Worker:  Hendricks Milo  Date/Time:  07/03/2013 03:37 PM  Referred by:  Physician  Date Referred:  07/03/2013 Referred for  SNF Placement   Other Referral:   Interview type:  Patient Other interview type:    PSYCHOSOCIAL DATA Living Status:  HUSBAND Admitted from facility:   Level of care:   Primary support name:  Sammuel Hines Primary support relationship to patient:  SPOUSE Degree of support available:   Very supportive.    CURRENT CONCERNS  Other Concerns:    SOCIAL WORK ASSESSMENT / PLAN Clinical Social Worker (CSW) met with patient to discuss SNF placement. Patient agreeable to fax out to [facility]. Patient reported that she has already pre-registered at [facility] and is just waiting on the insurance approval. CSW encouraged patient to think of a back up choice to [facility]. Patient verbalized her understanding.   Assessment/plan status:  Psychosocial Support/Ongoing Assessment of Needs Other assessment/ plan:   Information/referral to community resources:   CSW gave patient SNF list.    PATIENT'S/FAMILY'S RESPONSE TO PLAN OF CARE: Patient was appreciative of CSW visit.

## 2013-07-03 NOTE — Progress Notes (Addendum)
Clinical Social Work Department CLINICAL SOCIAL WORK PLACEMENT NOTE 07/03/2013  Patient:  Sharon Yu, Sharon Yu  Account Number:  [id number] Admit date:  07/02/2013  Clinical Social Worker:  Jetta Lout, Theresia Majors  Date/time:  07/03/2013 03:43 PM  Clinical Social Work is seeking post-discharge placement for this patient at the following level of care:   SKILLED NURSING   (*CSW will update this form in Epic as items are completed)   07/03/2013  Patient/family provided with [facility] Department of Clinical Social Work's list of facilities offering this level of care within the geographic area requested by the patient (or if unable, by the patient's family).  07/03/2013  Patient/family informed of their freedom to choose among providers that offer the needed level of care, that participate in Medicare, Medicaid or managed care program needed by the patient, have an available bed and are willing to accept the patient.  07/03/2013  Patient/family informed of [facility]' ownership interest in [facility], as well as of the fact that they are under no obligation to receive care at this facility.  PASARR submitted to EDS on 07/03/2013 PASARR number received from EDS on 07/03/2013  FL2 transmitted to all facilities in geographic area requested by pt/family on  07/03/2013 FL2 transmitted to all facilities within larger geographic area on   Patient informed that his/her managed care company has contracts with or will negotiate with  certain facilities, including the following:     Patient/family informed of bed offers received:  07/05/2013 Patient chooses bed at [facility] Physician recommends and patient chooses bed at    Patient to be transferred to [facility]  on  07/05/2013 Patient to be transferred to facility by [facility]  The following physician request were entered in Epic:   Additional Comments: Patient pre-registered at [facility] and that is her first choice at this  point.

## 2013-07-03 NOTE — Progress Notes (Signed)
Patient ID: Sharon Yu, female   DOB: 06-30-1951, 62 y.o.   MRN: [id number] PATIENT ID: Sharon Yu  MRN: [id number]  DOB/AGE:  12-Oct-1950 / 62 y.o.  1 Day Post-Op Procedure(s) (LRB): LEFT TOTAL KNEE TIBIA REVISION (Left)    PROGRESS NOTE Subjective: Patient is alert, oriented, no Nausea, no Vomiting, yes passing gas, no Bowel Movement. Taking PO well. Denies SOB, Chest or Calf Pain. Using Incentive Spirometer, PAS in place. Ambulate weight bearing as tolerated today with physical therapy, we will not use any CPM and active and passive range of motion are encouraged  Patient reports pain as 4 on 0-10 scale  .    Objective: Vital signs in last 24 hours: Filed Vitals:   07/03/13 0000 07/03/13 0155 07/03/13 0400 07/03/13 0601  BP:  150/87  129/79  Pulse:  90  92  Temp:  99.6 F (37.6 C)  98.8 F (37.1 C)  TempSrc:  Oral  Oral  Resp: 18 18 14 18   SpO2: 98% 98% 98% 99%      Intake/Output from previous day: I/O last 3 completed shifts: In: 2150 [I.V.:2150] Out: 1925 [Urine:1475; Drains:375; Blood:75]   Intake/Output this shift:     LABORATORY DATA:  Recent Labs  07/03/13 0422  WBC 9.6  HGB 11.2*  HCT 34.7*  PLT 135*    Examination: Neurologically intact ABD soft Neurovascular intact Sensation intact distally Intact pulses distally Dorsiflexion/Plantar flexion intact Incision: scant drainage No cellulitis present Compartment soft} Blood and plasma separated in drain indicating minimal recent drainage, drain pulled without difficulty. Postoperative x-ray show well-placed well fixed revision tibial component bearing and patellar component with 115 mm Assessment:   1 Day Post-Op Procedure(s) (LRB): LEFT TOTAL KNEE TIBIA REVISION (Left) ADDITIONAL DIAGNOSIS:  Hypertension  Plan: PT/OT WBAT, CPM 5/hrs day until ROM 0-90 degrees, then D/C CPM DVT Prophylaxis:  SCDx72hrs, ASA 325 mg BID x 2 weeks DISCHARGE PLAN: Skilled Nursing Facility/Rehab, patient has  interviewed at [facility] and lead F. L2 on chart appear DISCHARGE NEEDS: HHPT, HHRN, CPM, Walker and 3-in-1 comode seat     [place] J 07/03/2013, 8:12 AM

## 2013-07-04 LAB — CBC
HCT: 34.2 % — ABNORMAL LOW (ref 36.0–46.0)
Hemoglobin: 11.1 g/dL — ABNORMAL LOW (ref 12.0–15.0)
MCH: 27.5 pg (ref 26.0–34.0)
MCV: 84.9 fL (ref 78.0–100.0)
RDW: 15.4 % (ref 11.5–15.5)
WBC: 10.6 10*3/uL — ABNORMAL HIGH (ref 4.0–10.5)

## 2013-07-04 MED ORDER — ASPIRIN EC 325 MG PO TBEC: 325.0000 mg | DELAYED_RELEASE_TABLET | Freq: Two times a day (BID) | ORAL | Status: DC

## 2013-07-04 MED ORDER — OXYCODONE-ACETAMINOPHEN 5-325 MG PO TABS: 1.0000 | ORAL_TABLET | ORAL | Status: DC | PRN

## 2013-07-04 MED ORDER — METHOCARBAMOL 500 MG PO TABS: 500.0000 mg | ORAL_TABLET | Freq: Two times a day (BID) | ORAL | Status: DC

## 2013-07-04 NOTE — Progress Notes (Signed)
Physical Therapy Treatment Patient Details Name: Sharon Yu MRN: [id number] DOB: 16-Nov-1950 Today's Date: 07/04/2013 Time: 4696-2952 PT Time Calculation (min): 34 min  PT Assessment / Plan / Recommendation  History of Present Illness Pt. underwent revision of tibial and patellar components for looseneing  of TKA.   PT Comments   Making very good progress today with gait steadiness and distance; did not tolerate much PROM of L knee, especially into flexion; agree with dc to SNF to maximize independence and safety with mobiltiy prior to dchome  Follow Up Recommendations  SNF;Supervision/Assistance - 24 hour     Does the patient have the potential to tolerate intense rehabilitation     Barriers to Discharge        Equipment Recommendations  None recommended by PT    Recommendations for Other Services    Frequency 7X/week   Progress towards PT Goals Progress towards PT goals: Progressing toward goals  Plan Current plan remains appropriate    Precautions / Restrictions Precautions Precautions: Knee Precaution Comments: Pt. educated on knee precautions and ankle pumps/quad sets Restrictions Weight Bearing Restrictions: Yes LLE Weight Bearing: Weight bearing as tolerated   Pertinent Vitals/Pain 7/10 L knee; patient repositioned for comfort and optimal knee extension    Mobility  Bed Mobility Bed Mobility: Supine to Sit;Sitting - Scoot to Edge of Bed Supine to Sit: 4: Min assist;HOB elevated;With rails Sitting - Scoot to Edge of Bed: 5: Supervision Details for Bed Mobility Assistance: min assist to move L LE to edge of bed, cues for technique and safety Transfers Transfers: Sit to Stand;Stand to Sit Sit to Stand: 1: +2 Total assist;With upper extremity assist;From bed Sit to Stand: Patient Percentage: 70% Stand to Sit: 4: Min assist;With upper extremity assist;With armrests;To chair/3-in-1 Details for Transfer Assistance: cues for technique and safety; needed to "step  forward" with both LEs in order to achieve stand to sit  Ambulation/Gait Ambulation/Gait Assistance: 4: Min assist;4: Min guard Ambulation Distance (Feet): 85 Feet Assistive device: Rolling walker Ambulation/Gait Assistance Details: Continued cues for technique and sequence; as well as step length, and to control L knee in stance by activating quad Gait Pattern: Step-to pattern;Decreased step length - right;Decreased step length - left;Antalgic Gait velocity: decreased Stairs: No    Exercises Total Joint Exercises Ankle Circles/Pumps: AROM;Both;10 reps;Supine Heel Slides: AAROM;Left;5 reps   PT Diagnosis:    PT Problem List:   PT Treatment Interventions:     PT Goals (current goals can now be found in the care plan section) Acute Rehab PT Goals Patient Stated Goal: wants to return to independence and possibly work enviornment PT Goal Formulation: With patient Time For Goal Achievement: 07/10/13 Potential to Achieve Goals: Good  Visit Information  Last PT Received On: 07/04/13 Assistance Needed: +1 History of Present Illness: Pt. underwent revision of tibial and patellar components for looseneing  of TKA.    Subjective Data  Subjective: Feels better; reports is interested in having mroe rehab prior to going home Patient Stated Goal: wants to return to independence and possibly work enviornment   Cognition  Cognition Arousal/Alertness: Awake/alert Behavior During Therapy: WFL for tasks assessed/performed Overall Cognitive Status: Within Functional Limits for tasks assessed    Balance  Balance Balance Assessed: Yes  End of Session PT - End of Session Equipment Utilized During Treatment: Gait belt Activity Tolerance: Patient tolerated treatment well;Patient limited by fatigue;Patient limited by pain Patient left: in chair;with call bell/phone within reach;with family/visitor present Nurse Communication: Mobility status  GP     Van Clines [place], [place]  [phone]  07/04/2013, 9:45 AM

## 2013-07-04 NOTE — Progress Notes (Signed)
Utilization Review Completed.Dvid Pendry T11/10/2012  

## 2013-07-04 NOTE — Progress Notes (Signed)
PATIENT ID: Sharon Yu  MRN: [id number]  DOB/AGE:  [birth year] / 62 y.o.  2 Days Post-Op Procedure(s) (LRB): LEFT TOTAL KNEE TIBIA REVISION (Left)    PROGRESS NOTE Subjective: Patient is alert, oriented, no Nausea, no Vomiting, yes passing gas, no Bowel Movement. Taking PO well. Denies SOB, Chest or Calf Pain. Using Incentive Spirometer, PAS in place. Ambulate WBAT, no CPM, active and passive ROM encouraged Patient reports pain as mild  .    Objective: Vital signs in last 24 hours: Filed Vitals:   07/03/13 1452 07/03/13 1600 07/03/13 2129 07/04/13 0628  BP: 137/76  152/85 114/72  Pulse: 87  107 97  Temp: 100.6 F (38.1 C)  98.6 F (37 C) 98.8 F (37.1 C)  TempSrc:   Oral Oral  Resp: 18 16 18 18   SpO2: 100%  99% 98%      Intake/Output from previous day: I/O last 3 completed shifts: In: 2705 [P.O.:1080; I.V.:1625] Out: 1425 [Urine:1050; Drains:375]   Intake/Output this shift:     LABORATORY DATA:  Recent Labs  07/03/13 0422 07/04/13 0510  WBC 9.6 10.6*  HGB 11.2* 11.1*  HCT 34.7* 34.2*  PLT 135* 139*    Examination: Neurologically intact ABD soft Neurovascular intact Sensation intact distally Intact pulses distally Dorsiflexion/Plantar flexion intact Incision: scant drainage No cellulitis present Compartment soft}  Assessment:   2 Days Post-Op Procedure(s) (LRB): LEFT TOTAL KNEE TIBIA REVISION (Left) ADDITIONAL DIAGNOSIS:  Hypertension  Plan: PT/OT WBAT DVT Prophylaxis:  SCDx72hrs, ASA 325 mg BID x 2 weeks DISCHARGE PLAN: Skilled Nursing Facility/Rehab [facility] when bed available DISCHARGE NEEDS: HHPT, HHRN, Walker and 3-in-1 comode seat     Sharon Yu R 07/04/2013, 8:04 AM

## 2013-07-05 LAB — CBC
Hemoglobin: 10.1 g/dL — ABNORMAL LOW (ref 12.0–15.0)
MCH: 27.6 pg (ref 26.0–34.0)
MCHC: 32.7 g/dL (ref 30.0–36.0)
MCV: 84.4 fL (ref 78.0–100.0)
RBC: 3.66 MIL/uL — ABNORMAL LOW (ref 3.87–5.11)
WBC: 9 10*3/uL (ref 4.0–10.5)

## 2013-07-05 LAB — TISSUE CULTURE: Culture: NO GROWTH

## 2013-07-05 NOTE — Progress Notes (Signed)
PATIENT ID: Sharon Yu  MRN: [id number]  DOB/AGE:  [birth year] / 62 y.o.  3 Days Post-Op Procedure(s) (LRB): LEFT TOTAL KNEE TIBIA REVISION (Left)    PROGRESS NOTE Subjective: Patient is alert, oriented, no Nausea, no Vomiting, yes passing gas, no Bowel Movement. Taking PO well, pt up and eating. Denies SOB, Chest or Calf Pain. Using Incentive Spirometer, PAS in place. Ambulate WBAT, no CPM, active  ROM encouraged  Patient reports pain as mild  .    Objective: Vital signs in last 24 hours: Filed Vitals:   07/04/13 1315 07/04/13 1657 07/04/13 2150 07/05/13 0644  BP: 130/57  110/65 90/59  Pulse: 84  99 103  Temp: 98.3 F (36.8 C)  98 F (36.7 C) 99 F (37.2 C)  TempSrc:   Oral   Resp: 18 16 18 17   SpO2: 99% 99% 98% 98%      Intake/Output from previous day: I/O last 3 completed shifts: In: 2395 [P.O.:720; I.V.:1675] Out: -    Intake/Output this shift:     LABORATORY DATA:  Recent Labs  07/04/13 0510 07/05/13 0340  WBC 10.6* 9.0  HGB 11.1* 10.1*  HCT 34.2* 30.9*  PLT 139* 132*    Examination: Neurologically intact ABD soft Neurovascular intact Sensation intact distally Intact pulses distally Dorsiflexion/Plantar flexion intact Incision: scant drainage No cellulitis present Compartment soft}  Assessment:   3 Days Post-Op Procedure(s) (LRB): LEFT TOTAL KNEE TIBIA REVISION (Left) ADDITIONAL DIAGNOSIS:  Hypertension    Plan: PT/OT WBAT, encourage active ROM DVT Prophylaxis:  SCDx72hrs, ASA 325 mg BID x 2 weeks DISCHARGE PLAN: Skilled Nursing Facility/Rehab today if pt passes PT goals DISCHARGE NEEDS: HHPT, HHRN, Walker and 3-in-1 comode seat  Will monitor pt's BP as she was a little hypotensive today.  We held her BP med last night and will do the same this AM.  May consider bolus of fluid if symptoms persist.     Sharon Yu R 07/05/2013, 8:02 AM

## 2013-07-05 NOTE — Progress Notes (Signed)
Pt discharged to [facility]. Private vehicle to transport. Vitals stable, papers given.

## 2013-07-05 NOTE — Progress Notes (Signed)
CSW (Clinical Child psychotherapist) prepared pt dc packet and placed with shadow chart. Pt family to provide transportation to [facility] Facility and pt nurse aware. CSW signing off.  Drusilla Wampole, LCSWA [phone]

## 2013-07-05 NOTE — Progress Notes (Signed)
Patient has [employer] auth to go to [facility].   Sabino Niemann, MSW, [employer] [phone]

## 2013-07-05 NOTE — Progress Notes (Addendum)
CSW (Clinical Child psychotherapist) has sent PT/OT notes to facility and informed pt is ready for dc today. Facility to request insurance auth.  Addendum: Pt did received insurance auth and can dc to [facility] today if medically ready.  Bijon Mineer, LCSWA [phone]

## 2013-07-05 NOTE — Progress Notes (Signed)
Physical Therapy Treatment Patient Details Name: Sharon Yu MRN: [id number] DOB: [birth year] Today's Date: 07/05/2013 Time: 4098-1191 PT Time Calculation (min): 17 min  PT Assessment / Plan / Recommendation  History of Present Illness Pt. underwent revision of tibial and patellar components for looseneing  of TKA.   PT Comments     Follow Up Recommendations  SNF     Does the patient have the potential to tolerate intense rehabilitation     Barriers to Discharge        Equipment Recommendations  None recommended by PT    Recommendations for Other Services    Frequency 7X/week   Progress towards PT Goals Progress towards PT goals: Progressing toward goals  Plan Current plan remains appropriate    Precautions / Restrictions Precautions Precautions: Knee Precaution Comments: Pt. educated on knee precautions and ankle pumps/quad sets Restrictions LLE Weight Bearing: Weight bearing as tolerated   Pertinent Vitals/Pain 7/10 Lt knee.  Premedicated.  Repositioned for comfort.      Mobility  Bed Mobility Bed Mobility: Not assessed Transfers Transfers: Sit to Stand;Stand to Sit Sit to Stand: 4: Min guard;With upper extremity assist;With armrests;From chair/3-in-1 Stand to Sit: 4: Min guard;With upper extremity assist;With armrests;To chair/3-in-1 Details for Transfer Assistance: Guarding for safety.  Pt demonstrated safe hand placement & technique.   Ambulation/Gait Ambulation/Gait Assistance: 4: Min guard Ambulation Distance (Feet): 90 Feet Assistive device: Rolling walker Ambulation/Gait Assistance Details: cues for posture, increased heel strike Lt foot, quad activation during stance phase LLE.   Gait Pattern: Step-to pattern;Decreased weight shift to left;Decreased step length - right;Trunk flexed;Antalgic Gait velocity: decreased Stairs: No Wheelchair Mobility Wheelchair Mobility: No    Exercises Total Joint Exercises Ankle Circles/Pumps: AROM;Both;10 reps Quad  Sets: AROM;Both;10 reps Knee Flexion: AAROM;Left;5 reps Goniometric ROM: PROM Lt knee flexion ~40 degrees sitting.  Limited by pain & stiffness.       PT Goals (current goals can now be found in the care plan section) Acute Rehab PT Goals PT Goal Formulation: With patient Time For Goal Achievement: 07/10/13 Potential to Achieve Goals: Good  Visit Information  Last PT Received On: 07/05/13 Assistance Needed: +1 History of Present Illness: Pt. underwent revision of tibial and patellar components for looseneing  of TKA.    Subjective Data      Cognition  Cognition Arousal/Alertness: Awake/alert Behavior During Therapy: WFL for tasks assessed/performed Overall Cognitive Status: Within Functional Limits for tasks assessed    Balance     End of Session PT - End of Session Equipment Utilized During Treatment: Gait belt Activity Tolerance: Patient tolerated treatment well Patient left: in chair;with call bell/phone within reach Nurse Communication: Mobility status   GP     Lara Mulch 07/05/2013, 2:01 PM  Verdell Face, PTA [phone] 07/05/2013

## 2013-07-05 NOTE — Progress Notes (Signed)
Patients initial blood pressure was 110/65 and heart rate of 99.  Patients following blood pressure was 113/60 and heart rate of 103.  Patients blood pressure is trending lower than typical.  Dr. Vear Clock notified and approved to hold Lopressor 25 mg for one occurrence. Will continue to monitor.

## 2013-07-05 NOTE — Discharge Summary (Signed)
Patient ID: Sharon Yu MRN: [id number] DOB/AGE: 60-Sep-1952 60 y.o.  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 Hypertension     takes Maxzide and Metoprolol daily along with Ramipril  . Hyperlipidemia     takes Crestor daily  . Shortness of breath     with exertion  . Arthritis   . Joint pain   . Joint swelling   . Back pain     buldging disc  . Hypopotassemia     takes Potassium pill daily    Surgeries: Procedure(s): LEFT TOTAL KNEE TIBIA REVISION on 07/02/2013   Consultants:    Discharged Condition: Improved  Hospital Course: Sharon Yu is an 62 y.o. female who was admitted 07/02/2013 for operative treatment of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. Patient has severe unremitting pain that affects sleep, daily activities, and work/hobbies. After pre-op clearance the patient was taken to the operating room on 07/02/2013 and underwent  Procedure(s): LEFT TOTAL KNEE TIBIA REVISION.    Patient was given perioperative antibiotics: Anti-infectives   Start     Dose/Rate Route Frequency Ordered Stop   07/02/13 1357  cefUROXime (ZINACEF) injection  Status:  Discontinued       As needed 07/02/13 1357 07/02/13 1558   07/02/13 0600  ceFAZolin (ANCEF) IVPB 2 g/50 mL premix     2 g 100 mL/hr over 30 Minutes Intravenous On call to O.R. 07/01/13 1441 07/02/13 1349       Patient was given sequential compression devices, early ambulation, and chemoprophylaxis to prevent DVT.  Patient benefited maximally from hospital stay and there were no complications.    Recent vital signs: Patient Vitals for the past 24 hrs:  BP Temp Temp src Pulse Resp SpO2 Height Weight  07/05/13 1520 - - - - 18 98 % - -  07/05/13 1120 - - - - 16 98 % - -  07/05/13 1110 116/76 mmHg - - - -  - - -  07/05/13 0810 - - - - - - 5\' 7"  (1.702 m) 114.7 kg (252 lb 13.9 oz)  07/05/13 0800 - - - - 17 98 % - -  07/05/13 0644 90/59 mmHg 99 F (37.2 C) - 103 17 98 % - -  07/04/13 2150 110/65 mmHg 98 F (36.7 C) Oral 99 18 98 % - -  07/04/13 1657 - - - - 16 99 % - -     Recent laboratory studies:  Recent Labs  07/04/13 0510 07/05/13 0340  WBC 10.6* 9.0  HGB 11.1* 10.1*  HCT 34.2* 30.9*  PLT 139* 132*     Discharge Medications:     Medication List         alendronate 70 MG tablet  Commonly known as:  FOSAMAX  Take 70 mg by mouth every 7 (seven) days. Saturdays. Take with a full glass of water on an empty stomach.     aspirin EC 325 MG tablet  Take 1 tablet (325 mg total) by mouth 2 (two) times daily.     methocarbamol 500 MG tablet  Commonly known as:  ROBAXIN  Take 1 tablet (500 mg total) by mouth 2 (two) times daily with a meal.     metoprolol 50 MG tablet  Commonly known as:  LOPRESSOR  Take 25 mg by mouth 2 (two) times  daily.     multivitamin with minerals Tabs tablet  Take 1 tablet by mouth daily.     oxyCODONE-acetaminophen 5-325 MG per tablet  Commonly known as:  ROXICET  Take 1 tablet by mouth every 4 (four) hours as needed for pain.     Potassium 99 MG Tabs  Take 99 mg by mouth daily.     ramipril 10 MG tablet  Commonly known as:  ALTACE  Take 10 mg by mouth daily.     rosuvastatin 40 MG tablet  Commonly known as:  CRESTOR  Take 40 mg by mouth daily.     triamterene-hydrochlorothiazide 37.5-25 MG per tablet  Commonly known as:  MAXZIDE-25  Take 1 tablet by mouth daily.     vitamin E 400 UNIT capsule  Generic drug:  vitamin E  Take 400 Units by mouth daily.        Diagnostic Studies: Dg Knee Left Port  07/02/2013   CLINICAL DATA:  Mechanical loosening of prior knee joint. Postop.  EXAM: PORTABLE LEFT KNEE - 1-2 VIEW  COMPARISON:  None.  FINDINGS: Total knee arthroplasty is in place. Anatomic alignment of the osseous and prostatic  structures. No breakage or loosening of the hardware.  IMPRESSION: Total knee arthroplasty anatomically aligned.   Electronically Signed   By: Maryclare Bean M.D.   On: 07/02/2013 16:40    Disposition: 06-Home-Health Care Svc      Discharge Orders   Future Orders Complete By Expires   Call MD / Call 911  As directed    Comments:     If you experience chest pain or shortness of breath, CALL 911 and be transported to the hospital emergency room.  If you develope a fever above 101 F, pus (white drainage) or increased drainage or redness at the wound, or calf pain, call your surgeon's office.   Change dressing  As directed    Comments:     Change dressing on 5, then change the dressing daily with sterile 4 x 4 inch gauze dressing and apply TED hose.  You may clean the incision with alcohol prior to redressing.   Constipation Prevention  As directed    Comments:     Drink plenty of fluids.  Prune juice may be helpful.  You may use a stool softener, such as Colace (over the counter) 100 mg twice a day.  Use MiraLax (over the counter) for constipation as needed.   Diet - low sodium heart healthy  As directed    Discharge instructions  As directed    Comments:     Follow up in 2 weeks with Dr. Turner Daniels in office.   Driving restrictions  As directed    Comments:     No driving for 2 weeks   Increase activity slowly as tolerated  As directed    Patient may shower  As directed    Comments:     You may shower without a dressing once there is no drainage.  Do not wash over the wound.  If drainage remains, cover wound with plastic wrap and then shower.      Follow-up Information   Follow up with Nestor Lewandowsky, MD In 2 weeks.   Specialty:  Orthopedic Surgery   Contact information:   [address] [place] [address] [phone]        Signed: Henry Russel 07/05/2013, 3:56 PM

## 2013-07-06 ENCOUNTER — Encounter (HOSPITAL_COMMUNITY): Payer: Self-pay | Admitting: Orthopedic Surgery

## 2013-07-06 ENCOUNTER — Non-Acute Institutional Stay (SKILLED_NURSING_FACILITY): Payer: BC Managed Care – PPO | Admitting: Adult Health

## 2013-07-06 DIAGNOSIS — E785 Hyperlipidemia, unspecified: Secondary | ICD-10-CM

## 2013-07-06 DIAGNOSIS — I10 Essential (primary) hypertension: Secondary | ICD-10-CM

## 2013-07-06 DIAGNOSIS — T84033D 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, subsequent encounter: Secondary | ICD-10-CM

## 2013-07-06 DIAGNOSIS — E876 Hypokalemia: Secondary | ICD-10-CM

## 2013-07-06 DIAGNOSIS — Z5189 Encounter for other specified aftercare: Secondary | ICD-10-CM

## 2013-07-06 DIAGNOSIS — K5900 Constipation, unspecified: Secondary | ICD-10-CM

## 2013-07-07 ENCOUNTER — Non-Acute Institutional Stay (SKILLED_NURSING_FACILITY): Payer: BC Managed Care – PPO | Admitting: Internal Medicine

## 2013-07-07 DIAGNOSIS — T889XXS Complication of surgical and medical care, unspecified, sequela: Secondary | ICD-10-CM

## 2013-07-07 DIAGNOSIS — D62 Acute posthemorrhagic anemia: Secondary | ICD-10-CM

## 2013-07-07 DIAGNOSIS — E876 Hypokalemia: Secondary | ICD-10-CM

## 2013-07-07 DIAGNOSIS — I10 Essential (primary) hypertension: Secondary | ICD-10-CM

## 2013-07-07 DIAGNOSIS — T84033S 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, sequela: Secondary | ICD-10-CM

## 2013-07-07 LAB — ANAEROBIC CULTURE

## 2013-07-09 ENCOUNTER — Non-Acute Institutional Stay (SKILLED_NURSING_FACILITY): Payer: BC Managed Care – PPO | Admitting: Adult Health

## 2013-07-09 DIAGNOSIS — E876 Hypokalemia: Secondary | ICD-10-CM

## 2013-07-09 DIAGNOSIS — T84033D 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, subsequent encounter: Secondary | ICD-10-CM

## 2013-07-09 DIAGNOSIS — E785 Hyperlipidemia, unspecified: Secondary | ICD-10-CM

## 2013-07-09 DIAGNOSIS — Z5189 Encounter for other specified aftercare: Secondary | ICD-10-CM

## 2013-07-09 DIAGNOSIS — K5900 Constipation, unspecified: Secondary | ICD-10-CM

## 2013-07-09 DIAGNOSIS — I10 Essential (primary) hypertension: Secondary | ICD-10-CM

## 2013-07-25 DIAGNOSIS — I10 Essential (primary) hypertension: Secondary | ICD-10-CM | POA: Insufficient documentation

## 2013-07-25 DIAGNOSIS — E785 Hyperlipidemia, unspecified: Secondary | ICD-10-CM | POA: Insufficient documentation

## 2013-07-25 DIAGNOSIS — E876 Hypokalemia: Secondary | ICD-10-CM | POA: Insufficient documentation

## 2013-07-25 NOTE — Progress Notes (Signed)
Patient ID: Sharon Yu, female   DOB: 11-30-1950, 61 y.o.   MRN: [id number]              PROGRESS NOTE  DATE: 07/09/2013   FACILITY: [facility]  LEVEL OF CARE: SNF (31)  Acute Visit  CHIEF COMPLAINT:  Discharge Notes  HISTORY OF PRESENT ILLNESS: This is a 62 year old female who is for discharge home with Home health PT, OT and Nursing. She has been admitted to [facility] on 07/05/13 from [facility] with diagnosis of Mechanical Loosening of Internal Left Knee Prosthetic Joint S/P Left Total Knee Revision.  Patient was admitted to this facility for short-term rehabilitation after the patient's recent hospitalization.  Patient has completed SNF rehabilitation and therapy has cleared the patient for discharge.  Reassessment of ongoing problem(s):  HTN: Pt 's HTN remains stable.  Denies CP, sob, DOE, pedal edema, headaches, dizziness or visual disturbances.  No complications from the medications currently being used.  Last BP : 120/71  CONSTIPATION: The constipation remains stable. No complications from the medications presently being used. Patient denies ongoing constipation, abdominal pain, nausea or vomiting.  HYPOKALEMIA: The patient's hypokalemia remains stable. Patient denies muscle cramping or palpitations. No complications reported from current potassium supplementation. 11/14  K 3.8  PAST MEDICAL HISTORY : Reviewed.  No changes.  CURRENT MEDICATIONS: Reviewed per [facility]  REVIEW OF SYSTEMS:  GENERAL: no change in appetite, no fatigue, no weight changes, no fever, chills or weakness RESPIRATORY: no cough, SOB, DOE, wheezing, hemoptysis CARDIAC: no chest pain, edema or palpitations GI: no abdominal pain, diarrhea, constipation, heart burn, nausea or vomiting  PHYSICAL EXAMINATION  VS:  T99.2      P98       RR18      BP120/71      POX97 %       WT257.2 (Lb)  GENERAL: no acute distress, normal body habitus NECK: supple, trachea midline, no neck  masses, no thyroid tenderness, no thyromegaly LYMPHATICS: no LAN in the neck, no supraclavicular LAN RESPIRATORY: breathing is even & unlabored, BS CTAB CARDIAC: RRR, no murmur,no extra heart sounds, no edema GI: abdomen soft, normal BS, no masses, no tenderness, no hepatomegaly, no splenomegaly PSYCHIATRIC: the patient is alert & oriented to person, affect & behavior appropriate  LABS/RADIOLOGY: 07/06/13  NA 136  K 3.8  Glucose 154  BUN 19  Creatinine 0.9  CA 8.8  Wbc 8.0  hgb 9.9  hct 33.0 07/05/13  Wbc 9.0  hgb 10.1  hct 30.9   06/25/13  NA 142  K 3.8  Glucose 101  BUN 21 Creatinine 0.8 CA 9.9   ASSESSMENT/PLAN:  Mechanical Loosening of internal left knee prosthetic joint S/P left total knee revision - for Home health PT, OT and Nursing  Hypokalemia - continue K supplementation  Hyperlipidemia - continue Crestor  Constipation - continue Miralax and Colace   I have filled out patient's discharge paperwork and written prescriptions.  Patient will receive home health PT, OT and Nursing.   Total discharge time: Less than 30 minutes Discharge time involved coordination of the discharge process with Child psychotherapist, nursing staff and therapy department. Medical justification for home health services verified.  CPT CODE: 40981

## 2013-07-25 NOTE — Progress Notes (Signed)
Patient ID: Sharon Yu, female   DOB: Jul 20, 1951, 62 y.o.   MRN: [id number]                         PROGRESS NOTE  DATE: 07/06/2013  FACILITY:  [facility]  LEVEL OF CARE:   SNF (31)  Acute Visit  CHIEF COMPLAINT:  Follow-up Hospitalization  HISTORY OF PRESENT ILLNESS: This is a 62 year old female who has been admitted to [facility] on 07/05/13 from [facility] with diagnosis of Mechanical Loosening of Internal Left Knee Prosthetic Joint S/P Left Total Knee Revision. She has been admitted for a short-term rehabilitation.  REASSESSMENT OF ONGOING PROBLEM(S):  HTN: Pt 's HTN remains stable.  Denies CP, sob, DOE, pedal edema, headaches, dizziness or visual disturbances.  No complications from the medications currently being used.  Last BP : 118/81  HYPOKALEMIA: The patient's hypokalemia remains stable. Patient denies muscle cramping or palpitations. No complications reported from current potassium supplementation. 11/14 K 3.8  HYPERLIPIDEMIA: No complications from the medications presently being used. Currently on Crestor  PAST MEDICAL HISTORY : Reviewed.  No changes.  CURRENT MEDICATIONS: Reviewed per [facility]  REVIEW OF SYSTEMS:  GENERAL: no change in appetite, no fatigue, no weight changes, no fever, chills or weakness RESPIRATORY: no cough, SOB, DOE, wheezing, hemoptysis CARDIAC: no chest pain, edema or palpitations GI: no abdominal pain, diarrhea, heart burn, nausea or vomiting, +constipation  PHYSICAL EXAMINATION  VS:  T98       P101      RR18      BP118/81     POX98 %     WT256.4 (Lb)  GENERAL: no acute distress, normal body habitus EYES: conjunctivae normal, sclerae normal, normal eye lids NECK: supple, trachea midline, no neck masses, no thyroid tenderness, no thyromegaly LYMPHATICS: no LAN in the neck, no supraclavicular LAN RESPIRATORY: breathing is even & unlabored, BS CTAB CARDIAC: RRR, no murmur,no extra heart sounds, no edema GI:  abdomen soft, normal BS, no masses, no tenderness, no hepatomegaly, no splenomegaly PSYCHIATRIC: the patient is alert & oriented to person, affect & behavior appropriate  LABS/RADIOLOGY: 07/05/13  Wbc 9.0  hgb 10.1  hct 30.9   06/25/13  NA 142  K 3.8  Glucose 101  BUN 21 Creatinine 0.8 CA 9.9  ASSESSMENT/PLAN:  Mechanical Loosening of internal left knee prosthetic joint S/P left total knee revision - for rehabilitation  Hypokalemia - continue K supplementation  Hyperlipidemia - continue Crestor  Constipation - add prune juice on trays for breakfast, Miralax 17 gm + 6-8 oz liquid PO BID and Colace 100 mg PO BID   CPT CODE: 40981

## 2013-08-11 DIAGNOSIS — D62 Acute posthemorrhagic anemia: Secondary | ICD-10-CM | POA: Insufficient documentation

## 2013-08-11 NOTE — Progress Notes (Signed)
Patient ID: Sharon Yu, female   DOB: [birth year], 62 y.o.   MRN: [id number]        HISTORY & PHYSICAL  DATE: 07/07/2013     FACILITY: [facility]  LEVEL OF CARE: SNF (31)  ALLERGIES:  Allergies  Allergen Reactions  . Other Hives and Swelling    "Grapple" (hybrid apple & grape)    CHIEF COMPLAINT:  Manage left knee pain, hypertension, and hypokalemia.    HISTORY OF PRESENT ILLNESS:  The patient is a 62 year-old, African-American female.    LEFT KNEE PAIN:    This patient had a history of left knee arthroplasty, but started having severe pain secondary to mechanical loosening of the prosthetic joint.   Therefore, she underwent left total knee tibia revision and tolerated the procedure well.  Patient is admitted to this facility for short-term rehabilitation.  She denies ongoing knee pain.    HTN: Pt 's HTN remains stable.  Denies CP, sob, DOE, pedal edema, headaches, dizziness or visual disturbances.  No complications from the medications currently being used.  Last BP :  106/69.    HYPOKALEMIA: The patient's hypokalemia remains stable. Patient denies muscle cramping or palpitations. No complications reported from current potassium supplementation.  A potassium level is not available.    PAST MEDICAL HISTORY :  Past Medical History  Diagnosis Date  . Hypertension     takes Maxzide and Metoprolol daily along with Ramipril  . Hyperlipidemia     takes Crestor daily  . Shortness of breath     with exertion  . Arthritis   . Joint pain   . Joint swelling   . Back pain     buldging disc  . Hypopotassemia     takes Potassium pill daily    PAST SURGICAL HISTORY: Past Surgical History  Procedure Laterality Date  . Carpel tunnel      bilateral  . Cholecystectomy    . Bilateral knee arthroscopies    . Joint replacement Left   . Colonscopy    . Total knee arthroplasty Right 02/19/2013    Procedure: TOTAL KNEE ARTHROPLASTY;  Surgeon: Thera Flake., MD;   Location: [facility] OR;  Service: Orthopedics;  Laterality: Right;  right total knee arthroplasty  . Total knee revision Left 07/02/2013    Procedure: LEFT TOTAL KNEE TIBIA REVISION;  Surgeon: Nestor Lewandowsky, MD;  Location: [facility] OR;  Service: Orthopedics;  Laterality: Left;    SOCIAL HISTORY:  reports that she has never smoked. She does not have any smokeless tobacco history on file. She reports that she does not drink alcohol or use illicit drugs.  FAMILY HISTORY: None  CURRENT MEDICATIONS: Reviewed per MAR  REVIEW OF SYSTEMS:  See HPI otherwise 14 point ROS is negative.  PHYSICAL EXAMINATION  VS:  T 97.9       P 99      RR 18      BP 106/69     POX 98%        WT (Lb)  GENERAL: no acute distress, morbidly obese body habitus EYES: conjunctivae normal, sclerae normal, normal eye lids MOUTH/THROAT: lips without lesions,no lesions in the mouth,tongue is without lesions,uvula elevates in midline NECK: supple, trachea midline, no neck masses, no thyroid tenderness, no thyromegaly LYMPHATICS: no LAN in the neck, no supraclavicular LAN RESPIRATORY: breathing is even & unlabored, BS CTAB CARDIAC: RRR, no murmur,no extra heart sounds EDEMA/VARICOSITIES: right lower extremity has +1 edema, left lower  extremity has +2 edema   ARTERIAL: pedal pulses +1 bilaterally   GI:  ABDOMEN: abdomen soft, normal BS, no masses, no tenderness  LIVER/SPLEEN: no hepatomegaly, no splenomegaly MUSCULOSKELETAL: HEAD: normal to inspection & palpation BACK: no kyphosis, scoliosis or spinal processes tenderness EXTREMITIES: LEFT UPPER EXTREMITY: full range of motion, normal strength & tone RIGHT UPPER EXTREMITY:  full range of motion, normal strength & tone LEFT LOWER EXTREMITY: strength intact, range of motion not tested due to surgery   RIGHT LOWER EXTREMITY:  full range of motion, normal strength & tone PSYCHIATRIC: the patient is alert & oriented to person, affect & behavior appropriate  LABS/RADIOLOGY: Left knee  x-ray postsurgically:  Showed left total knee arthroplasty.    Labs reviewed: Basic Metabolic Panel:  Recent Labs  16/10/96 1144 02/19/13 1340 02/20/13 0615 06/25/13 1024  NA 143  --  139 142  K 3.8  --  3.4* 3.8  CL 106  --  105 105  CO2 28  --  25 26  GLUCOSE 97  --  145* 101*  BUN 16  --  10 21  CREATININE 0.97 0.89 0.95 0.86  CALCIUM 9.3  --  8.0* 9.9   Liver Function Tests:  Recent Labs  02/10/13 1144  AST 16  ALT 22  ALKPHOS 92  BILITOT 0.2*  PROT 8.1  ALBUMIN 3.8    CBC:  Recent Labs  02/10/13 1144  06/25/13 1024 07/03/13 0422 07/04/13 0510 07/05/13 0340  WBC 9.2  < > 7.1 9.6 10.6* 9.0  NEUTROABS 5.1  --  3.8  --   --   --   HGB 14.0  < > 13.8 11.2* 11.1* 10.1*  HCT 43.7  < > 42.7 34.7* 34.2* 30.9*  MCV 88.8  < > 84.7 85.3 84.9 84.4  PLT 206  < > 175 135* 139* 132*  < > = values in this interval not displayed.  ASSESSMENT/PLAN:  Left knee pain.  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  Status post revision.  Continue rehabilitation.    Hypertension.  Well controlled.    Hypokalemia.  Continue supplementation and reassess.    Acute blood loss anemia.  Reassess hemoglobin level.    Hyperlipidemia.  Continue Crestor.    Check CBC with diff and BMP.    I have reviewed patient's medical records received at admission/from hospitalization.  CPT CODE: 04540

## 2013-08-16 ENCOUNTER — Ambulatory Visit: Payer: BC Managed Care – PPO | Attending: Orthopedic Surgery | Admitting: Rehabilitation

## 2013-08-16 DIAGNOSIS — M25569 Pain in unspecified knee: Secondary | ICD-10-CM | POA: Insufficient documentation

## 2013-08-16 DIAGNOSIS — Z96659 Presence of unspecified artificial knee joint: Secondary | ICD-10-CM | POA: Insufficient documentation

## 2013-08-16 DIAGNOSIS — I10 Essential (primary) hypertension: Secondary | ICD-10-CM | POA: Insufficient documentation

## 2013-08-16 DIAGNOSIS — M25669 Stiffness of unspecified knee, not elsewhere classified: Secondary | ICD-10-CM | POA: Insufficient documentation

## 2013-08-16 DIAGNOSIS — R609 Edema, unspecified: Secondary | ICD-10-CM | POA: Insufficient documentation

## 2013-08-16 DIAGNOSIS — IMO0001 Reserved for inherently not codable concepts without codable children: Secondary | ICD-10-CM | POA: Insufficient documentation

## 2013-08-18 ENCOUNTER — Ambulatory Visit: Payer: BC Managed Care – PPO | Admitting: Rehabilitation

## 2013-08-23 ENCOUNTER — Ambulatory Visit: Payer: BC Managed Care – PPO | Admitting: Rehabilitation

## 2017-07-14 ENCOUNTER — Emergency Department (HOSPITAL_BASED_OUTPATIENT_CLINIC_OR_DEPARTMENT_OTHER): Payer: Medicare Other

## 2017-07-14 ENCOUNTER — Inpatient Hospital Stay (HOSPITAL_BASED_OUTPATIENT_CLINIC_OR_DEPARTMENT_OTHER)
Admission: EM | Admit: 2017-07-14 | Discharge: 2017-07-18 | DRG: 287 | Disposition: A | Discharge status: Home / Self Care | Payer: Medicare Other | Attending: Internal Medicine | Admitting: Internal Medicine

## 2017-07-14 ENCOUNTER — Other Ambulatory Visit: Payer: Self-pay

## 2017-07-14 ENCOUNTER — Encounter (HOSPITAL_BASED_OUTPATIENT_CLINIC_OR_DEPARTMENT_OTHER): Payer: Self-pay | Admitting: *Deleted

## 2017-07-14 DIAGNOSIS — I10 Essential (primary) hypertension: Secondary | ICD-10-CM | POA: Diagnosis present

## 2017-07-14 DIAGNOSIS — E119 Type 2 diabetes mellitus without complications: Secondary | ICD-10-CM | POA: Diagnosis present

## 2017-07-14 DIAGNOSIS — I429 Cardiomyopathy, unspecified: Secondary | ICD-10-CM | POA: Diagnosis present

## 2017-07-14 DIAGNOSIS — E876 Hypokalemia: Secondary | ICD-10-CM | POA: Diagnosis present

## 2017-07-14 DIAGNOSIS — Z91018 Allergy to other foods: Secondary | ICD-10-CM

## 2017-07-14 DIAGNOSIS — R0602 Shortness of breath: Secondary | ICD-10-CM | POA: Diagnosis not present

## 2017-07-14 DIAGNOSIS — R911 Solitary pulmonary nodule: Secondary | ICD-10-CM | POA: Diagnosis present

## 2017-07-14 DIAGNOSIS — E785 Hyperlipidemia, unspecified: Secondary | ICD-10-CM | POA: Diagnosis present

## 2017-07-14 DIAGNOSIS — Z7983 Long term (current) use of bisphosphonates: Secondary | ICD-10-CM

## 2017-07-14 DIAGNOSIS — I248 Other forms of acute ischemic heart disease: Secondary | ICD-10-CM | POA: Diagnosis present

## 2017-07-14 DIAGNOSIS — M1990 Unspecified osteoarthritis, unspecified site: Secondary | ICD-10-CM | POA: Diagnosis present

## 2017-07-14 DIAGNOSIS — I7121 Aneurysm of the ascending aorta, without rupture: Secondary | ICD-10-CM

## 2017-07-14 DIAGNOSIS — I5041 Acute combined systolic (congestive) and diastolic (congestive) heart failure: Secondary | ICD-10-CM | POA: Diagnosis present

## 2017-07-14 DIAGNOSIS — I712 Thoracic aortic aneurysm, without rupture: Secondary | ICD-10-CM

## 2017-07-14 DIAGNOSIS — I5042 Chronic combined systolic (congestive) and diastolic (congestive) heart failure: Secondary | ICD-10-CM

## 2017-07-14 DIAGNOSIS — I110 Hypertensive heart disease with heart failure: Secondary | ICD-10-CM | POA: Diagnosis not present

## 2017-07-14 DIAGNOSIS — Z6841 Body Mass Index (BMI) 40.0 and over, adult: Secondary | ICD-10-CM

## 2017-07-14 DIAGNOSIS — Z9049 Acquired absence of other specified parts of digestive tract: Secondary | ICD-10-CM

## 2017-07-14 DIAGNOSIS — R7989 Other specified abnormal findings of blood chemistry: Secondary | ICD-10-CM | POA: Diagnosis present

## 2017-07-14 DIAGNOSIS — I509 Heart failure, unspecified: Secondary | ICD-10-CM

## 2017-07-14 DIAGNOSIS — Z7982 Long term (current) use of aspirin: Secondary | ICD-10-CM

## 2017-07-14 DIAGNOSIS — E1165 Type 2 diabetes mellitus with hyperglycemia: Secondary | ICD-10-CM

## 2017-07-14 DIAGNOSIS — R778 Other specified abnormalities of plasma proteins: Secondary | ICD-10-CM | POA: Diagnosis present

## 2017-07-14 LAB — COMPREHENSIVE METABOLIC PANEL
ALBUMIN: 3.9 g/dL (ref 3.5–5.0)
ALK PHOS: 77 U/L (ref 38–126)
ALT: 21 U/L (ref 14–54)
ANION GAP: 6 (ref 5–15)
AST: 18 U/L (ref 15–41)
BUN: 19 mg/dL (ref 6–20)
CALCIUM: 8.9 mg/dL (ref 8.9–10.3)
CO2: 25 mmol/L (ref 22–32)
Chloride: 109 mmol/L (ref 101–111)
Creatinine, Ser: 1.01 mg/dL — ABNORMAL HIGH (ref 0.44–1.00)
GFR, EST NON AFRICAN AMERICAN: 58 mL/min — AB (ref >60)
GLUCOSE: 104 mg/dL — AB (ref 65–99)
POTASSIUM: 3.6 mmol/L (ref 3.5–5.1)
Sodium: 140 mmol/L (ref 135–145)
TOTAL PROTEIN: 7.6 g/dL (ref 6.5–8.1)
Total Bilirubin: 0.3 mg/dL (ref 0.3–1.2)

## 2017-07-14 LAB — CBC WITH DIFFERENTIAL/PLATELET
BASOS PCT: 0 %
Basophils Absolute: 0 10*3/uL (ref 0.0–0.1)
Eosinophils Absolute: 0.2 10*3/uL (ref 0.0–0.7)
Eosinophils Relative: 2 %
HEMATOCRIT: 40.8 % (ref 36.0–46.0)
Hemoglobin: 13.3 g/dL (ref 12.0–15.0)
LYMPHS ABS: 3.1 10*3/uL (ref 0.7–4.0)
Lymphocytes Relative: 34 %
MCH: 28.9 pg (ref 26.0–34.0)
MCHC: 32.6 g/dL (ref 30.0–36.0)
MCV: 88.7 fL (ref 78.0–100.0)
Monocytes Absolute: 0.7 10*3/uL (ref 0.1–1.0)
Monocytes Relative: 7 %
Neutro Abs: 5.3 10*3/uL (ref 1.7–7.7)
Neutrophils Relative %: 57 %
Platelets: 208 10*3/uL (ref 150–400)
RBC: 4.6 MIL/uL (ref 3.87–5.11)
RDW: 14.1 % (ref 11.5–15.5)
WBC: 9.2 10*3/uL (ref 4.0–10.5)

## 2017-07-14 LAB — D-DIMER, QUANTITATIVE (NOT AT ARMC): D DIMER QUANT: 0.71 ug{FEU}/mL — AB (ref 0.00–0.50)

## 2017-07-14 LAB — TROPONIN I: TROPONIN I: 0.05 ng/mL — AB (ref <0.03)

## 2017-07-14 LAB — BRAIN NATRIURETIC PEPTIDE: B NATRIURETIC PEPTIDE 5: 330.5 pg/mL — AB (ref 0.0–100.0)

## 2017-07-14 MED ORDER — IOPAMIDOL (ISOVUE-370) INJECTION 76%
100.0000 mL | Freq: Once | INTRAVENOUS | Status: AC | PRN
  Administered 2017-07-14: 95 mL via INTRAVENOUS

## 2017-07-14 NOTE — ED Triage Notes (Addendum)
Pt c/o SOb x 2 weeks  Sent here from PMD office for eval

## 2017-07-14 NOTE — ED Notes (Signed)
Notified [facility] Benna Dunks) to transfer patient's admission to [facility] versus [facility] (no beds until discharges).  Benna Dunks will make necessary calls to facilitate transfer.

## 2017-07-14 NOTE — ED Notes (Signed)
ED Provider at bedside. 

## 2017-07-14 NOTE — Progress Notes (Signed)
Called by [facility] regarding transfer to [facility].  Patient is a 66yo with h/o HTN and HLD presenting with possible new onset CHF.  She has had 2-3 weeks of gradually worsening DOE - now with ADLs.  +chest pressure.  Also orthopnea, PND.  No LE edema.  Currently chest pain free.  No DVT RF.  VSS.  Does not appear to be fluid overloaded.  Mildly elevated troponin 0.05, BNP 300s.  D-dimer positive, CTA negative other than pulmonary nodule and AAA.  Needs observation,telemetry.  Georgana Curio, M.D.

## 2017-07-14 NOTE — ED Notes (Signed)
Patient transported to CT 

## 2017-07-14 NOTE — ED Provider Notes (Signed)
[facility] EMERGENCY DEPARTMENT Provider Note   CSN: [id number] Arrival date & time: 07/14/17  1803     History   Chief Complaint Chief Complaint  Patient presents with  . Shortness of Breath    HPI Sharon Yu is a 66 y.o. female.  HPI 66 year old female who presents with shortness of breath.  She has a history of hypertension, hyperlipidemia.  Reports that she over the past 2-3 weeks has had gradually worsening dyspnea on exertion.  States that now while she is getting dressed at home and doing to 39s in her home she is feeling winded and has to rest.  States that normally she sleeps on 1-2 pillows at nighttime, but still feels more short of breath when she is lying down and occasionally wakes up in the middle of the night feeling a little short of breath.  No lower extremity edema or increased abdominal.  No chest pain but feels chest pressure when she feels short of breath or winded.  No fevers, cough, nausea or vomiting, diaphoresis, recent immobilization, exogenous estrogens or hormones, leg pain, or personal/family history of PE/DVT.  However reports a history of stress testing several years ago that with a couple.  No tobacco use or lung problems. Past Medical History:  Diagnosis Date  . Arthritis   . Back pain    buldging disc  . Hyperlipidemia    takes Crestor daily  . Hypertension    takes Maxzide and Metoprolol daily along with Ramipril  . Hypopotassemia    takes Potassium pill daily  . Joint pain   . Joint swelling   . Shortness of breath    with exertion    Patient Active Problem List   Diagnosis Date Noted  . CHF exacerbation (HCC) 07/14/2017  . Acute posthemorrhagic anemia 08/11/2013  . Hypokalemia 07/25/2013  . Hypertension 07/25/2013  . Hyperlipidemia 07/25/2013  . 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 bilateral knee arthroscopies    .  carpel tunnel     bilateral  . CHOLECYSTECTOMY    . colonscopy    . JOINT REPLACEMENT Left     OB History    No data available       Home Medications    Prior to Admission medications   Medication Sig Start Date End Date Taking? Authorizing Provider  alendronate (FOSAMAX) 70 MG tablet Take 70 mg by mouth every 7 (seven) days. Saturdays. Take with a full glass of water on an empty stomach.    [provider]  aspirin EC 325 MG tablet Take 1 tablet (325 mg total) by mouth 2 (two) times daily. 07/04/13   Allena Katz, PA-C  methocarbamol (ROBAXIN) 500 MG tablet Take 1 tablet (500 mg total) by mouth 2 (two) times daily with a meal. 07/04/13   Allena Katz, PA-C  metoprolol (LOPRESSOR) 50 MG tablet Take 25 mg by mouth 2 (two) times daily.    [provider]  Multiple Vitamin (MULITIVITAMIN WITH MINERALS) TABS Take 1 tablet by mouth daily.    [provider]  oxyCODONE-acetaminophen (ROXICET) 5-325 MG per tablet Take 1 tablet by mouth every 4 (four) hours as needed for pain. 07/04/13   Allena Katz, PA-C  Potassium 99 MG TABS Take 99 mg by mouth daily.    [provider]  ramipril (ALTACE) 10 MG tablet Take 10 mg by mouth daily.  [provider]  triamterene-hydrochlorothiazide (MAXZIDE-25) 37.5-25 MG per tablet Take 1 tablet by mouth daily.    [provider]  vitamin E (VITAMIN E) 400 UNIT capsule Take 400 Units by mouth daily.    [provider]    Family History No family history on file.  Social History Social History   Tobacco Use  . Smoking status: Never Smoker  Substance Use Topics  . Alcohol use: No  . Drug use: No     Allergies   Other   Review of Systems Review of Systems  Constitutional: Negative for fever.  Respiratory: Positive for shortness of breath. Negative for cough.   Cardiovascular: Negative for leg swelling.  Gastrointestinal: Negative for abdominal pain.  All other systems  reviewed and are negative.    Physical Exam Updated Vital Signs BP (!) 152/91   Pulse 79   Temp 98 F (36.7 C)   Resp (!) 23   Ht 5\' 7"  (1.702 m)   Wt 118.8 kg (262 lb)   SpO2 100%   BMI 41.04 kg/m   Physical Exam Physical Exam  Nursing note and vitals reviewed. Constitutional: Well developed, well nourished, non-toxic, and in no acute distress Head: Normocephalic and atraumatic.  Mouth/Throat: Oropharynx is clear and moist.  Neck: Normal range of motion. Neck supple.  Cardiovascular: Normal rate and regular rhythm.   Pulmonary/Chest: Effort normal and breath sounds normal.  Abdominal: Soft. There is no tenderness. There is no rebound and no guarding.  Musculoskeletal: Normal range of motion. No edema Neurological: Alert, no facial droop, fluent speech, moves all extremities symmetrically Skin: Skin is warm and dry.  Psychiatric: Cooperative   ED Treatments / Results  Labs (all labs ordered are listed, but only abnormal results are displayed) Labs Reviewed  COMPREHENSIVE METABOLIC PANEL - Abnormal; Notable for the following components:      Result Value   Glucose, Bld 104 (*)    Creatinine, Ser 1.01 (*)    GFR calc non Af Amer 58 (*)    All other components within normal limits  BRAIN NATRIURETIC PEPTIDE - Abnormal; Notable for the following components:   B Natriuretic Peptide 330.5 (*)    All other components within normal limits  TROPONIN I - Abnormal; Notable for the following components:   Troponin I 0.05 (*)    All other components within normal limits  D-DIMER, QUANTITATIVE (NOT AT [facility]) - Abnormal; Notable for the following components:   D-Dimer, Quant 0.71 (*)    All other components within normal limits  CBC WITH DIFFERENTIAL/PLATELET    EKG  EKG Interpretation None       Radiology Dg Chest 2 View  Result Date: 07/14/2017 CLINICAL DATA:  66 year old female with shortness of breath. EXAM: CHEST  2 VIEW COMPARISON:  Earlier chest radiograph  dated 07/14/2017 FINDINGS: There is no focal consolidation, pleural effusion, or pneumothorax. Top-normal cardiac size. No vascular congestion or edema there is degenerative changes of the shoulders hand AC joints. No acute osseous pathology. IMPRESSION: No active cardiopulmonary disease. Electronically Signed   By: Elgie Collard M.D.   On: 07/14/2017 18:39   Ct Angio Chest Pe W And/or Wo Contrast  Result Date: 07/14/2017 CLINICAL DATA:  Shortness of breath for 2 weeks. EXAM: CT ANGIOGRAPHY CHEST WITH CONTRAST TECHNIQUE: Multidetector CT imaging of the chest was performed using the standard protocol during bolus administration of intravenous contrast. Multiplanar CT image reconstructions and MIPs were obtained to evaluate the vascular anatomy. CONTRAST:  95mL ISOVUE-370  IOPAMIDOL (ISOVUE-370) INJECTION 76% COMPARISON:  Chest x-ray from same day. FINDINGS: Cardiovascular: Satisfactory opacification of the pulmonary arteries to the segmental level. No evidence of pulmonary embolism. Normal heart size. No pericardial effusion. Mild aneurysmal dilatation of the ascending thoracic aorta, measuring up to 4.3 cm. Mediastinum/Nodes: No enlarged mediastinal, hilar, or axillary lymph nodes. Thyroid gland, trachea, and esophagus demonstrate no significant findings. Lungs/Pleura: There is a 3 mm pulmonary nodule in the right upper lobe (series 5, image 44). Mild bibasilar atelectasis. Trace bilateral pleural effusions. No focal consolidation or pneumothorax. Upper Abdomen:  No acute abnormality.  Prior cholecystectomy. Musculoskeletal: No chest wall abnormality. No acute or significant osseous findings. Degenerative changes of the thoracic spine with evidence of DISH is. Review of the MIP images confirms the above findings. IMPRESSION: 1. No evidence of pulmonary embolism. 2. Trace bilateral pleural effusions. 3. Mild aneurysmal dilatation of the ascending thoracic aorta, measuring up to 4.3 cm. Recommend annual imaging  followup by CTA or MRA. This recommendation follows 2010 ACCF/AHA/AATS/ACR/ASA/SCA/SCAI/SIR/STS/SVM Guidelines for the Diagnosis and Management of Patients with Thoracic Aortic Disease. Circulation. 2010; 121: e266-e369 4. 3 mm pulmonary nodule in the right upper lobe. No follow-up needed if patient is low-risk. Non-contrast chest CT can be considered in 12 months if patient is high-risk. This recommendation follows the consensus statement: Guidelines for Management of Incidental Pulmonary Nodules Detected on CT Images: From the Fleischner Society 2017; Radiology 2017; 284:228-243. Electronically Signed   By: Obie DredgeWilliam T Derry M.D.   On: 07/14/2017 20:47    Procedures Procedures (including critical care time)  Medications Ordered in ED Medications  iopamidol (ISOVUE-370) 76 % injection 100 mL (95 mLs Intravenous Contrast Given 07/14/17 2026)     Initial Impression / Assessment and Plan / ED Course  I have reviewed the triage vital signs and the nursing notes.  Pertinent labs & imaging results that were available during my care of the patient were reviewed by me and considered in my medical decision making (see chart for details).     Presents with DOE, mild orthopnea and PND. At rest, she is comfortable, breathing comfortably on room air, with normal O2 saturations.  Does not look clinically fluid overloaded.  Her EKG does not show acute ischemic changes.  However there are signs of mild heart strain with mildly elevated troponin of 0.05 and BNP of 330.  Her d-dimer is also positive, so she underwent CT angios to rule out potential PE.  CT angios does not show evidence of PE.  She does have evidence of small pleural effusions and descending thoracic aortic aneurysmal dilation.  Given symptoms potentially of CHF or heart strain, this was discussed with Dr. Ophelia CharterYates from hospitalist service who [facility]  Final Clinical Impressions(s) / ED Diagnoses   Final  diagnoses:  Shortness of breath  Acute on chronic congestive heart failure, unspecified heart failure type [facility](HCC)    ED Discharge Orders    None       Lavera GuiseLiu, [place] Griswold Duo, MD 07/14/17 2310

## 2017-07-14 NOTE — ED Notes (Signed)
Date and time results received: 07/14/17 1938   Test: troponin Critical Value: 0.05  Name of Provider Notified: Dr. Verdie Mosher Faced to face, provider in department  Orders Received? Or Actions Taken?: no new orders received

## 2017-07-15 ENCOUNTER — Other Ambulatory Visit: Payer: Self-pay

## 2017-07-15 ENCOUNTER — Encounter (HOSPITAL_COMMUNITY): Payer: Self-pay | Admitting: Internal Medicine

## 2017-07-15 ENCOUNTER — Observation Stay (HOSPITAL_BASED_OUTPATIENT_CLINIC_OR_DEPARTMENT_OTHER): Payer: Medicare Other

## 2017-07-15 DIAGNOSIS — E876 Hypokalemia: Secondary | ICD-10-CM | POA: Diagnosis present

## 2017-07-15 DIAGNOSIS — I110 Hypertensive heart disease with heart failure: Secondary | ICD-10-CM | POA: Diagnosis present

## 2017-07-15 DIAGNOSIS — I712 Thoracic aortic aneurysm, without rupture: Secondary | ICD-10-CM

## 2017-07-15 DIAGNOSIS — Z7982 Long term (current) use of aspirin: Secondary | ICD-10-CM | POA: Diagnosis not present

## 2017-07-15 DIAGNOSIS — I5021 Acute systolic (congestive) heart failure: Secondary | ICD-10-CM | POA: Diagnosis not present

## 2017-07-15 DIAGNOSIS — I351 Nonrheumatic aortic (valve) insufficiency: Secondary | ICD-10-CM | POA: Diagnosis not present

## 2017-07-15 DIAGNOSIS — Z9049 Acquired absence of other specified parts of digestive tract: Secondary | ICD-10-CM | POA: Diagnosis not present

## 2017-07-15 DIAGNOSIS — I7121 Aneurysm of the ascending aorta, without rupture: Secondary | ICD-10-CM

## 2017-07-15 DIAGNOSIS — R748 Abnormal levels of other serum enzymes: Secondary | ICD-10-CM | POA: Diagnosis not present

## 2017-07-15 DIAGNOSIS — Z91018 Allergy to other foods: Secondary | ICD-10-CM | POA: Diagnosis not present

## 2017-07-15 DIAGNOSIS — E785 Hyperlipidemia, unspecified: Secondary | ICD-10-CM | POA: Diagnosis present

## 2017-07-15 DIAGNOSIS — E119 Type 2 diabetes mellitus without complications: Secondary | ICD-10-CM | POA: Diagnosis present

## 2017-07-15 DIAGNOSIS — R778 Other specified abnormalities of plasma proteins: Secondary | ICD-10-CM | POA: Diagnosis present

## 2017-07-15 DIAGNOSIS — I5023 Acute on chronic systolic (congestive) heart failure: Secondary | ICD-10-CM | POA: Diagnosis not present

## 2017-07-15 DIAGNOSIS — I509 Heart failure, unspecified: Secondary | ICD-10-CM | POA: Diagnosis not present

## 2017-07-15 DIAGNOSIS — Z7983 Long term (current) use of bisphosphonates: Secondary | ICD-10-CM | POA: Diagnosis not present

## 2017-07-15 DIAGNOSIS — Z6841 Body Mass Index (BMI) 40.0 and over, adult: Secondary | ICD-10-CM | POA: Diagnosis not present

## 2017-07-15 DIAGNOSIS — I5042 Chronic combined systolic (congestive) and diastolic (congestive) heart failure: Secondary | ICD-10-CM

## 2017-07-15 DIAGNOSIS — E1169 Type 2 diabetes mellitus with other specified complication: Secondary | ICD-10-CM | POA: Diagnosis not present

## 2017-07-15 DIAGNOSIS — M1990 Unspecified osteoarthritis, unspecified site: Secondary | ICD-10-CM | POA: Diagnosis present

## 2017-07-15 DIAGNOSIS — I5041 Acute combined systolic (congestive) and diastolic (congestive) heart failure: Secondary | ICD-10-CM | POA: Diagnosis present

## 2017-07-15 DIAGNOSIS — I10 Essential (primary) hypertension: Secondary | ICD-10-CM | POA: Diagnosis not present

## 2017-07-15 DIAGNOSIS — I429 Cardiomyopathy, unspecified: Secondary | ICD-10-CM | POA: Diagnosis present

## 2017-07-15 DIAGNOSIS — I248 Other forms of acute ischemic heart disease: Secondary | ICD-10-CM | POA: Diagnosis present

## 2017-07-15 DIAGNOSIS — R0602 Shortness of breath: Secondary | ICD-10-CM | POA: Diagnosis present

## 2017-07-15 DIAGNOSIS — R911 Solitary pulmonary nodule: Secondary | ICD-10-CM | POA: Diagnosis present

## 2017-07-15 DIAGNOSIS — R7989 Other specified abnormal findings of blood chemistry: Secondary | ICD-10-CM | POA: Diagnosis present

## 2017-07-15 LAB — BASIC METABOLIC PANEL
Anion gap: 8 (ref 5–15)
BUN: 15 mg/dL (ref 6–20)
CALCIUM: 8.6 mg/dL — AB (ref 8.9–10.3)
CO2: 25 mmol/L (ref 22–32)
Chloride: 108 mmol/L (ref 101–111)
Creatinine, Ser: 0.94 mg/dL (ref 0.44–1.00)
GFR calc Af Amer: >60 mL/min (ref >60)
GLUCOSE: 99 mg/dL (ref 65–99)
Potassium: 3.3 mmol/L — ABNORMAL LOW (ref 3.5–5.1)
Sodium: 141 mmol/L (ref 135–145)

## 2017-07-15 LAB — CBC
HEMATOCRIT: 39.4 % (ref 36.0–46.0)
Hemoglobin: 12.6 g/dL (ref 12.0–15.0)
MCH: 28.6 pg (ref 26.0–34.0)
MCHC: 32 g/dL (ref 30.0–36.0)
MCV: 89.3 fL (ref 78.0–100.0)
Platelets: 183 10*3/uL (ref 150–400)
RBC: 4.41 MIL/uL (ref 3.87–5.11)
RDW: 14.1 % (ref 11.5–15.5)
WBC: 7.6 10*3/uL (ref 4.0–10.5)

## 2017-07-15 LAB — TROPONIN I
Troponin I: 0.04 ng/mL (ref <0.03)
Troponin I: 0.04 ng/mL (ref <0.03)
Troponin I: 0.04 ng/mL (ref <0.03)

## 2017-07-15 LAB — HIV ANTIBODY (ROUTINE TESTING W REFLEX): HIV SCREEN 4TH GENERATION: NONREACTIVE

## 2017-07-15 LAB — MAGNESIUM: MAGNESIUM: 1.8 mg/dL (ref 1.7–2.4)

## 2017-07-15 LAB — ECHOCARDIOGRAM COMPLETE
Height: 67 in
WEIGHTICAEL: 4165.81 [oz_av]

## 2017-07-15 LAB — TSH: TSH: 2.867 u[IU]/mL (ref 0.350–4.500)

## 2017-07-15 MED ORDER — FUROSEMIDE 10 MG/ML IJ SOLN: 40.0000 mg | Freq: Once | INTRAMUSCULAR | Status: DC

## 2017-07-15 MED ORDER — RAMIPRIL 5 MG PO CAPS
10.0000 mg | ORAL_CAPSULE | Freq: Every day | ORAL | Status: DC
  Administered 2017-07-15 – 2017-07-17 (×3): 10 mg via ORAL
  Filled 2017-07-15 (×4): qty 2

## 2017-07-15 MED ORDER — POTASSIUM CHLORIDE CRYS ER 20 MEQ PO TBCR
40.0000 meq | EXTENDED_RELEASE_TABLET | Freq: Once | ORAL | Status: AC
  Administered 2017-07-15: 40 meq via ORAL
  Filled 2017-07-15: qty 2

## 2017-07-15 MED ORDER — METOPROLOL TARTRATE 25 MG PO TABS
25.0000 mg | ORAL_TABLET | Freq: Two times a day (BID) | ORAL | Status: DC
  Administered 2017-07-15 – 2017-07-17 (×7): 25 mg via ORAL
  Filled 2017-07-15 (×7): qty 1

## 2017-07-15 MED ORDER — PERFLUTREN LIPID MICROSPHERE
1.0000 mL | INTRAVENOUS | Status: AC | PRN
  Administered 2017-07-15: 2 mL via INTRAVENOUS
  Filled 2017-07-15: qty 10

## 2017-07-15 MED ORDER — ENOXAPARIN SODIUM 40 MG/0.4ML ~~LOC~~ SOLN
40.0000 mg | SUBCUTANEOUS | Status: DC
  Administered 2017-07-15 – 2017-07-17 (×3): 40 mg via SUBCUTANEOUS
  Filled 2017-07-15 (×3): qty 0.4

## 2017-07-15 MED ORDER — ACETAMINOPHEN 325 MG PO TABS: 650.0000 mg | ORAL_TABLET | Freq: Four times a day (QID) | ORAL | Status: DC | PRN

## 2017-07-15 MED ORDER — POTASSIUM GLUCONATE 595 (99 K) MG PO TABS
595.0000 mg | ORAL_TABLET | Freq: Every day | ORAL | Status: DC
  Administered 2017-07-15 – 2017-07-18 (×4): 595 mg via ORAL
  Filled 2017-07-15 (×4): qty 1

## 2017-07-15 MED ORDER — ASPIRIN 81 MG PO CHEW
81.0000 mg | CHEWABLE_TABLET | Freq: Every day | ORAL | Status: DC
  Administered 2017-07-15 – 2017-07-18 (×4): 81 mg via ORAL
  Filled 2017-07-15 (×4): qty 1

## 2017-07-15 MED ORDER — FUROSEMIDE 10 MG/ML IJ SOLN
40.0000 mg | Freq: Two times a day (BID) | INTRAMUSCULAR | Status: DC
  Administered 2017-07-15 – 2017-07-17 (×5): 40 mg via INTRAVENOUS
  Filled 2017-07-15 (×5): qty 4

## 2017-07-15 MED ORDER — ONDANSETRON HCL 4 MG PO TABS: 4.0000 mg | ORAL_TABLET | Freq: Four times a day (QID) | ORAL | Status: DC | PRN

## 2017-07-15 MED ORDER — ADULT MULTIVITAMIN W/MINERALS CH
1.0000 | ORAL_TABLET | Freq: Every day | ORAL | Status: DC
  Administered 2017-07-15 – 2017-07-18 (×4): 1 via ORAL
  Filled 2017-07-15 (×4): qty 1

## 2017-07-15 MED ORDER — PERFLUTREN LIPID MICROSPHERE
INTRAVENOUS | Status: AC
  Filled 2017-07-15: qty 10

## 2017-07-15 MED ORDER — POTASSIUM 99 MG PO TABS: 99.0000 mg | ORAL_TABLET | Freq: Every day | ORAL | Status: DC

## 2017-07-15 MED ORDER — ACETAMINOPHEN 650 MG RE SUPP: 650.0000 mg | Freq: Four times a day (QID) | RECTAL | Status: DC | PRN

## 2017-07-15 MED ORDER — TRIAMTERENE-HCTZ 37.5-25 MG PO TABS
1.0000 | ORAL_TABLET | Freq: Every day | ORAL | Status: DC
  Filled 2017-07-15: qty 1

## 2017-07-15 MED ORDER — ONDANSETRON HCL 4 MG/2ML IJ SOLN: 4.0000 mg | Freq: Four times a day (QID) | INTRAMUSCULAR | Status: DC | PRN

## 2017-07-15 NOTE — H&P (Addendum)
History and Physical    [name] ZOX:[id number] DOB: [birth year] DOA: 07/14/2017  PCP: Pediatrics, [facility]  Patient coming from: Home.  Chief Complaint: Shortness of breath.  HPI: Sharon Yu is a 66 y.o. female with history of hypertension presents to the ER with increasing shortness of breath over the last 2 weeks.  Patient states she has been getting chest tightness with shortness of breath on exertion over the last 2 weeks denies any associated productive cough fever or chills.  Shortness of breath also worsens on lying flat.  Patient did not notice any lower extremity edema.  Patient states she has been taking her blood pressure medications.  ED Course: In the ER labs revealed BNP of 330 with mildly elevated troponin and d-dimer 0.71.  CT angiogram of the chest done shows trace bilateral pleural effusion negative for pulmonary embolism did show ascending aortic aneurysm.  Patient is being admitted for further observation.  EKG only showed biatrial enlargement with nonspecific T wave changes.  Review of Systems: As per HPI, rest all negative.   Past Medical History:  Diagnosis Date  . Arthritis   . Back pain    buldging disc  . Hyperlipidemia    takes Crestor daily  . Hypertension    takes Maxzide and Metoprolol daily along with Ramipril  . Hypopotassemia    takes Potassium pill daily  . Joint pain   . Joint swelling   . Shortness of breath    with exertion    Past Surgical History:  Procedure Laterality Date  . bilateral knee arthroscopies    . carpel tunnel     bilateral  . CHOLECYSTECTOMY    . colonscopy    . JOINT REPLACEMENT Left      reports that  has never smoked. she has never used smokeless tobacco. She reports that she does not drink alcohol or use drugs.  Allergies  Allergen Reactions  . Other Hives and Swelling    "Grapple" (hybrid apple & grape)    Family History - Sister has CAD, Mother has Hypertension.  Prior to  Admission medications   Medication Sig Start Date End Date Taking? Authorizing Provider  alendronate (FOSAMAX) 70 MG tablet Take 70 mg by mouth every 7 (seven) days. Saturdays. Take with a full glass of water on an empty stomach.   Yes [provider]  aspirin 81 MG chewable tablet Chew 81 mg daily by mouth.   Yes [provider]  metoprolol (LOPRESSOR) 50 MG tablet Take 25 mg by mouth 2 (two) times daily.   Yes [provider]  Multiple Vitamin (MULITIVITAMIN WITH MINERALS) TABS Take 1 tablet by mouth daily.   Yes [provider]  Potassium 99 MG TABS Take 99 mg by mouth daily.   Yes [provider]  ramipril (ALTACE) 10 MG tablet Take 10 mg by mouth daily.   Yes [provider]  triamterene-hydrochlorothiazide (MAXZIDE-25) 37.5-25 MG per tablet Take 1 tablet by mouth daily.   Yes [provider]  vitamin E (VITAMIN E) 400 UNIT capsule Take 400 Units by mouth daily.   Yes [provider]    Physical Exam: Vitals:   07/14/17 2100 07/14/17 2200 07/14/17 2300 07/15/17 0055  BP: (!) 152/91 (!) 148/94 (!) 134/96 (!) 164/88  Pulse: 79 84 81 97  Resp: (!) 23 (!) 21 19 18   Temp:    97.8 F (36.6 C)  TempSrc:    Oral  SpO2: 100% 100%  98% 100%  Weight:    118.2 kg (260 lb 9.3 oz)  Height:    5\' 7"  (1.702 m)      Constitutional: Moderately built and nourished. Vitals:   07/14/17 2100 07/14/17 2200 07/14/17 2300 07/15/17 0055  BP: (!) 152/91 (!) 148/94 (!) 134/96 (!) 164/88  Pulse: 79 84 81 97  Resp: (!) 23 (!) 21 19 18   Temp:    97.8 F (36.6 C)  TempSrc:    Oral  SpO2: 100% 100% 98% 100%  Weight:    118.2 kg (260 lb 9.3 oz)  Height:    5\' 7"  (1.702 m)   Eyes: Anicteric no pallor. ENMT: No discharge from the ears eyes nose or mouth. Neck: No mass felt.  No neck rigidity.  JVD elevated. Respiratory: No rhonchi or crepitations. Cardiovascular: S1-S2 heard no murmurs appreciated. Abdomen: Soft nontender bowel  sounds present. Musculoskeletal: No edema.  No joint effusion. Skin: No rash.  Skin appears warm. Neurologic: Alert awake oriented to time place and person.  Moves all extremities. Psychiatric: Appears normal.  Normal affect.   Labs on Admission: I have personally reviewed following labs and imaging studies  CBC: Recent Labs  Lab 07/14/17 1852  WBC 9.2  NEUTROABS 5.3  HGB 13.3  HCT 40.8  MCV 88.7  PLT 208   Basic Metabolic Panel: Recent Labs  Lab 07/14/17 1852  NA 140  K 3.6  CL 109  CO2 25  GLUCOSE 104*  BUN 19  CREATININE 1.01*  CALCIUM 8.9   GFR: Estimated Creatinine Clearance: 74.8 mL/min (A) (by C-G formula based on SCr of 1.01 mg/dL (H)). Liver Function Tests: Recent Labs  Lab 07/14/17 1852  AST 18  ALT 21  ALKPHOS 77  BILITOT 0.3  PROT 7.6  ALBUMIN 3.9   No results for input(s): LIPASE, AMYLASE in the last 168 hours. No results for input(s): AMMONIA in the last 168 hours. Coagulation Profile: No results for input(s): INR, PROTIME in the last 168 hours. Cardiac Enzymes: Recent Labs  Lab 07/14/17 1852  TROPONINI 0.05*   BNP (last 3 results) No results for input(s): PROBNP in the last 8760 hours. HbA1C: No results for input(s): HGBA1C in the last 72 hours. CBG: No results for input(s): GLUCAP in the last 168 hours. Lipid Profile: No results for input(s): CHOL, HDL, LDLCALC, TRIG, CHOLHDL, LDLDIRECT in the last 72 hours. Thyroid Function Tests: No results for input(s): TSH, T4TOTAL, FREET4, T3FREE, THYROIDAB in the last 72 hours. Anemia Panel: No results for input(s): VITAMINB12, FOLATE, FERRITIN, TIBC, IRON, RETICCTPCT in the last 72 hours. Urine analysis:    Component Value Date/Time   COLORURINE YELLOW 06/25/2013 1022   APPEARANCEUR CLEAR 06/25/2013 1022   LABSPEC 1.017 06/25/2013 1022   PHURINE 5.5 06/25/2013 1022   GLUCOSEU NEGATIVE 06/25/2013 1022   HGBUR MODERATE (A) 06/25/2013 1022   BILIRUBINUR NEGATIVE 06/25/2013 1022    KETONESUR NEGATIVE 06/25/2013 1022   PROTEINUR NEGATIVE 06/25/2013 1022   UROBILINOGEN 0.2 06/25/2013 1022   NITRITE NEGATIVE 06/25/2013 1022   LEUKOCYTESUR NEGATIVE 06/25/2013 1022   Sepsis Labs: @LABRCNTIP (procalcitonin:4,lacticidven:4) )No results found for this or any previous visit (from the past 240 hour(s)).   Radiological Exams on Admission: Dg Chest 2 View  Result Date: 07/14/2017 CLINICAL DATA:  66 year old female with shortness of breath. EXAM: CHEST  2 VIEW COMPARISON:  Earlier chest radiograph dated 07/14/2017 FINDINGS: There is no focal consolidation, pleural effusion, or pneumothorax. Top-normal cardiac size. No vascular congestion or edema there is degenerative  changes of the shoulders hand AC joints. No acute osseous pathology. IMPRESSION: No active cardiopulmonary disease. Electronically Signed   By: Elgie CollardArash  Radparvar M.D.   On: 07/14/2017 18:39   Ct Angio Chest Pe W And/or Wo Contrast  Result Date: 07/14/2017 CLINICAL DATA:  Shortness of breath for 2 weeks. EXAM: CT ANGIOGRAPHY CHEST WITH CONTRAST TECHNIQUE: Multidetector CT imaging of the chest was performed using the standard protocol during bolus administration of intravenous contrast. Multiplanar CT image reconstructions and MIPs were obtained to evaluate the vascular anatomy. CONTRAST:  95mL ISOVUE-370 IOPAMIDOL (ISOVUE-370) INJECTION 76% COMPARISON:  Chest x-ray from same day. FINDINGS: Cardiovascular: Satisfactory opacification of the pulmonary arteries to the segmental level. No evidence of pulmonary embolism. Normal heart size. No pericardial effusion. Mild aneurysmal dilatation of the ascending thoracic aorta, measuring up to 4.3 cm. Mediastinum/Nodes: No enlarged mediastinal, hilar, or axillary lymph nodes. Thyroid gland, trachea, and esophagus demonstrate no significant findings. Lungs/Pleura: There is a 3 mm pulmonary nodule in the right upper lobe (series 5, image 44). Mild bibasilar atelectasis. Trace bilateral  pleural effusions. No focal consolidation or pneumothorax. Upper Abdomen:  No acute abnormality.  Prior cholecystectomy. Musculoskeletal: No chest wall abnormality. No acute or significant osseous findings. Degenerative changes of the thoracic spine with evidence of DISH is. Review of the MIP images confirms the above findings. IMPRESSION: 1. No evidence of pulmonary embolism. 2. Trace bilateral pleural effusions. 3. Mild aneurysmal dilatation of the ascending thoracic aorta, measuring up to 4.3 cm. Recommend annual imaging followup by CTA or MRA. This recommendation follows 2010 ACCF/AHA/AATS/ACR/ASA/SCA/SCAI/SIR/STS/SVM Guidelines for the Diagnosis and Management of Patients with Thoracic Aortic Disease. Circulation. 2010; 121: e266-e369 4. 3 mm pulmonary nodule in the right upper lobe. No follow-up needed if patient is low-risk. Non-contrast chest CT can be considered in 12 months if patient is high-risk. This recommendation follows the consensus statement: Guidelines for Management of Incidental Pulmonary Nodules Detected on CT Images: From the Fleischner Society 2017; Radiology 2017; 284:228-243. Electronically Signed   By: Obie DredgeWilliam T Derry M.D.   On: 07/14/2017 20:47    EKG: Independently reviewed.  Normal sinus rhythm with biatrial enlargement.  Assessment/Plan Principal Problem:   CHF exacerbation (HCC) Active Problems:   Hypertension   Elevated troponin   CHF (congestive heart failure) (HCC)    1. Acute CHF unknown EF -patient likely has CHF given the symptoms.  Since patient had CT angiogram of the chest we will repeat metabolic panel and if creatinine remains stable will give Lasix.  Cycle cardiac markers check 2D echo follow intake output metabolic panel daily weights. 2. Elevated troponin with shortness of breath and chest tightness -cycle cardiac markers check 2D echo aspirin.  I have consulted cardiology. 3. Hypertension on ACE inhibitor metoprolol and Maxide.  Blood pressure noted to  be uncontrolled.  Closely follow blood pressure trends.  Addendum - Ascending Aortic Aneurysm will need outpatient follow up. Will await Cardiology recommendations.   DVT prophylaxis: Lovenox. Code Status: Full code. Family Communication: Discussed with patient. Disposition Plan: Home. Consults called: Cardiology. Admission status: Observation.   Eduard ClosKAKRAKANDY,Kimberle Stanfill N. MD [facility] Pager [phone].  If 7PM-7AM, please contact night-coverage [email or web address] Password [email or web address]  07/15/2017, 3:05 AM

## 2017-07-15 NOTE — Progress Notes (Signed)
Taking over care of patient agree with previous RN assessment. Denies any needs at this time will continue to monitor. 

## 2017-07-15 NOTE — Consult Note (Signed)
Cardiology Consult    Patient ID: Sharon Yu; [id number]; 21-Jun-1951   Admit date: 07/14/2017 Date of Consult: 07/15/2017  Primary Care Provider: Pediatrics, [facility] Primary Cardiologist: New to [facility] - Dr. Rennis Golden   Patient Profile    Sharon Yu is a 66 y.o. female with past medical history of HTN, HLD, and arthritis who is being seen today for the evaluation of CHF at the request of Dr. Toniann Fail.   History of Present Illness    Sharon Yu presented to [facility] on 07/14/2017 for evaluation of worsening dyspnea on exertion for the past several weeks with associated orthopnea. She reports becoming significantly short of breath with walking around her home, having to stop and rest after 20 feet of ambulation. She has noticed chest tightness which occurs with the activity or when lying down at night. Upon sitting up, her breathing and chest discomfort improve. Weights have been stable on her home scales but she has experienced abdominal distension over the past few weeks despite having regular bowel movements.    She denies any prior history of CAD or CHF. She does have a sister who passed away at age 33 due to complications with CHF. No prior tobacco use, alcohol use, or recreational drug use.   Initial labs show WBC of 9.2, Hgb 13.3, platelets 208, Na+ 140, K+ 3.6, creatinine 1.01. BNP 330. D-dimer 0.71. Initial troponin 0.05 with repeat of 0.04. CXR with no active cardiopulmonary disease. CTA showed no evidence of a pulmonary embolism but trace bilateral pleural effusions were noted along with aneurysmal dilatation of the ascending thoracic aorta, measuring up to 4.3 cm with annual imaging followup by CTA or MRA recommended. Also found to have a 3 mm pulmonary nodule in the right upper lobe. EKG shows NSR, HR 91, with no acute ST or T-wave changes when compared to prior tracings.   She has been started on IV Lasix but only received one dose. Weight  is down two pounds since admission but I&O's have not been recorded.    Past Medical History:  Diagnosis Date  . Arthritis   . Back pain    buldging disc  . Hyperlipidemia    takes Crestor daily  . Hypertension    takes Maxzide and Metoprolol daily along with Ramipril  . Hypopotassemia    takes Potassium pill daily  . Joint pain   . Joint swelling   . Shortness of breath    with exertion    Past Surgical History:  Procedure Laterality Date  . bilateral knee arthroscopies    . carpel tunnel     bilateral  . CHOLECYSTECTOMY    . colonscopy    . JOINT REPLACEMENT Left      Home Medications:  Prior to Admission medications   Medication Sig Start Date End Date Taking? Authorizing Provider  alendronate (FOSAMAX) 70 MG tablet Take 70 mg by mouth every 7 (seven) days. Saturdays. Take with a full glass of water on an empty stomach.   Yes [provider]  aspirin 81 MG chewable tablet Chew 81 mg daily by mouth.   Yes [provider]  metoprolol (LOPRESSOR) 50 MG tablet Take 25 mg by mouth 2 (two) times daily.   Yes [provider]  Multiple Vitamin (MULITIVITAMIN WITH MINERALS) TABS Take 1 tablet by mouth daily.   Yes [provider]  Potassium 99 MG TABS Take 99 mg by mouth daily.   Yes [provider]  ramipril (ALTACE) 10 MG tablet Take 10 mg by mouth daily.   Yes [provider]  triamterene-hydrochlorothiazide (MAXZIDE-25) 37.5-25 MG per tablet Take 1 tablet by mouth daily.   Yes [provider]  vitamin E (VITAMIN E) 400 UNIT capsule Take 400 Units by mouth daily.   Yes [provider]    Inpatient Medications: Scheduled Meds: . aspirin  81 mg Oral Daily  . enoxaparin (LOVENOX) injection  40 mg Subcutaneous Q24H  . furosemide  40 mg Intravenous BID  . metoprolol tartrate  25 mg Oral BID  . multivitamin with minerals  1 tablet Oral Daily  . potassium chloride  40 mEq Oral Once  . potassium gluconate   595 mg Oral Daily  . ramipril  10 mg Oral Daily   Continuous Infusions:  PRN Meds: acetaminophen **OR** acetaminophen, ondansetron **OR** ondansetron (ZOFRAN) IV  Allergies:    Allergies  Allergen Reactions  . Other Hives and Swelling    "Grapple" (hybrid apple & grape)    Social History:   Social History   Socioeconomic History  . Marital status: Married    Spouse name: Not on file  . Number of children: Not on file  . Years of education: Not on file  . Highest education level: Not on file  Social Needs  . Financial resource strain: Not on file  . Food insecurity - worry: Not on file  . Food insecurity - inability: Not on file  . Transportation needs - medical: Not on file  . Transportation needs - non-medical: Not on file  Occupational History  . Not on file  Tobacco Use  . Smoking status: Never Smoker  . Smokeless tobacco: Never Used  Substance and Sexual Activity  . Alcohol use: No  . Drug use: No  . Sexual activity: Yes    Birth control/protection: Post-menopausal  Other Topics Concern  . Not on file  Social History Narrative  . Not on file     Family History:    Family History  Problem Relation Age of Onset  . Hypertension Mother   . CAD Sister   . Heart failure Sister       Review of Systems    General:  No chills, fever, night sweats or weight changes.  Cardiovascular:  No edema, orthopnea, palpitations, paroxysmal nocturnal dyspnea. Positive for chest pain and dyspnea on exertion.  Dermatological: No rash, lesions/masses Respiratory: No cough, dyspnea Urologic: No hematuria, dysuria Abdominal:   No nausea, vomiting, diarrhea, bright red blood per rectum, melena, or hematemesis Neurologic:  No visual changes, wkns, changes in mental status. All other systems reviewed and are otherwise negative except as noted above.  Physical Exam/Data    Vitals:   07/14/17 2300 07/15/17 0055 07/15/17 0500 07/15/17 0615  BP: (!) 134/96 (!) 164/88  (!)  154/97  Pulse: 81 97  81  Resp: 19 18  20   Temp:  97.8 F (36.6 C)  (!) 97.5 F (36.4 C)  TempSrc:  Oral  Oral  SpO2: 98% 100%  100%  Weight:  260 lb 9.3 oz (118.2 kg) 260 lb 5.8 oz (118.1 kg)   Height:  5\' 7"  (1.702 m)     No intake or output data in the 24 hours ending 07/15/17 0950 Filed Weights   07/14/17 1805 07/15/17 0055 07/15/17 0500  Weight: 262 lb (118.8 kg) 260 lb 9.3 oz (118.2 kg) 260 lb 5.8 oz (118.1 kg)   Body mass index is 40.78 kg/m.  General: Pleasant, obese African American female appearing in NAD. Psych: Normal affect. Neuro: Alert and oriented X 3. Moves all extremities spontaneously. HEENT: Normal  Neck: Supple without bruits. JVD difficult to assess secondary to body habitus. Lungs:  Resp regular and unlabored, CTA without wheezing or rales. Heart: RRR no s3, s4, or murmurs. Abdomen: Soft, non-tender, non-distended, BS + x 4.  Extremities: No clubbing or cyanosis. Trace lower extremity edema bilaterally. DP/PT/Radials 2+ and equal bilaterally.   EKG:  The EKG was personally reviewed and demonstrates: NSR, HR 91, with no acute ST or T-wave changes when compared to prior tracings.  Telemetry:  Telemetry was personally reviewed and demonstrates:  NSR, HR in 70's - 110's.    Labs/Studies     Relevant CV Studies:  Echocardiogram: Pending  Laboratory Data:  Chemistry Recent Labs  Lab 07/14/17 1852 07/15/17 0412  NA 140 141  K 3.6 3.3*  CL 109 108  CO2 25 25  GLUCOSE 104* 99  BUN 19 15  CREATININE 1.01* 0.94  CALCIUM 8.9 8.6*  GFRNONAA 58* >60  GFRAA >60 >60  ANIONGAP 6 8    Recent Labs  Lab 07/14/17 1852  PROT 7.6  ALBUMIN 3.9  AST 18  ALT 21  ALKPHOS 77  BILITOT 0.3   Hematology Recent Labs  Lab 07/14/17 1852 07/15/17 0412  WBC 9.2 7.6  RBC 4.60 4.41  HGB 13.3 12.6  HCT 40.8 39.4  MCV 88.7 89.3  MCH 28.9 28.6  MCHC 32.6 32.0  RDW 14.1 14.1  PLT 208 183   Cardiac Enzymes Recent Labs  Lab 07/14/17 1852  07/15/17 0412  TROPONINI 0.05* 0.04*   No results for input(s): TROPIPOC in the last 168 hours.  BNP Recent Labs  Lab 07/14/17 1852  BNP 330.5*    DDimer  Recent Labs  Lab 07/14/17 1852  DDIMER 0.71*    Radiology/Studies:  Dg Chest 2 View  Result Date: 07/14/2017 CLINICAL DATA:  66 year old female with shortness of breath. EXAM: CHEST  2 VIEW COMPARISON:  Earlier chest radiograph dated 07/14/2017 FINDINGS: There is no focal consolidation, pleural effusion, or pneumothorax. Top-normal cardiac size. No vascular congestion or edema there is degenerative changes of the shoulders hand AC joints. No acute osseous pathology. IMPRESSION: No active cardiopulmonary disease. Electronically Signed   By: Elgie CollardArash  Radparvar M.D.   On: 07/14/2017 18:39   Ct Angio Chest Pe W And/or Wo Contrast  Result Date: 07/14/2017 CLINICAL DATA:  Shortness of breath for 2 weeks. EXAM: CT ANGIOGRAPHY CHEST WITH CONTRAST TECHNIQUE: Multidetector CT imaging of the chest was performed using the standard protocol during bolus administration of intravenous contrast. Multiplanar CT image reconstructions and MIPs were obtained to evaluate the vascular anatomy. CONTRAST:  95mL ISOVUE-370 IOPAMIDOL (ISOVUE-370) INJECTION 76% COMPARISON:  Chest x-ray from same day. FINDINGS: Cardiovascular: Satisfactory opacification of the pulmonary arteries to the segmental level. No evidence of pulmonary embolism. Normal heart size. No pericardial effusion. Mild aneurysmal dilatation of the ascending thoracic aorta, measuring up to 4.3 cm. Mediastinum/Nodes: No enlarged mediastinal, hilar, or axillary lymph nodes. Thyroid gland, trachea, and esophagus demonstrate no significant findings. Lungs/Pleura: There is a 3 mm pulmonary nodule in the right upper lobe (series 5, image 44). Mild bibasilar atelectasis. Trace bilateral pleural effusions. No focal consolidation or pneumothorax. Upper Abdomen:  No acute abnormality.  Prior cholecystectomy.  Musculoskeletal: No chest wall abnormality. No acute or significant osseous findings. Degenerative changes of the thoracic spine with evidence of DISH is. Review of the MIP  images confirms the above findings. IMPRESSION: 1. No evidence of pulmonary embolism. 2. Trace bilateral pleural effusions. 3. Mild aneurysmal dilatation of the ascending thoracic aorta, measuring up to 4.3 cm. Recommend annual imaging followup by CTA or MRA. This recommendation follows 2010 ACCF/AHA/AATS/ACR/ASA/SCA/SCAI/SIR/STS/SVM Guidelines for the Diagnosis and Management of Patients with Thoracic Aortic Disease. Circulation. 2010; 121: e266-e369 4. 3 mm pulmonary nodule in the right upper lobe. No follow-up needed if patient is low-risk. Non-contrast chest CT can be considered in 12 months if patient is high-risk. This recommendation follows the consensus statement: Guidelines for Management of Incidental Pulmonary Nodules Detected on CT Images: From the Fleischner Society 2017; Radiology 2017; 284:228-243. Electronically Signed   By: Obie Dredge M.D.   On: 07/14/2017 20:47     Assessment & Plan    1. Dyspnea on Exertion/ Acute CHF Exacerbation - the patient reports worsening dyspnea on exertion, orthopnea, and abdominal distension for the past 3 weeks. Has also noticed chest tightness which occurs with activity or at rest.  - initial labs show a BNP of 330. D-dimer 0.71. Initial troponin 0.05 with repeat of 0.04. CTA showed no evidence of a PE. EKG shows NSR, HR 91, with no acute ST or T-wave changes when compared to prior tracings.  - will order Lasix 40mg  BID. Monitor I&O's along with daily weights. An echocardiogram is pending to assess for structural abnormalities.  - her dyspnea on exertion is possibly secondary to CHF and/or CAD as she has noted associated chest discomfort with activity as well. She does have cardiac risk factors including HTN, HLD, prediabetes, and family history of CAD. If EF is reduced or WMA is  noted, would recommend a cardiac catheterization for definitive evaluation. If echo without significant abnormalities and dyspnea improves significantly with diuresis, consider an outpatient NST for ischemic evaluation (would be a 2-day study secondary to weight).   2. HTN -  BP has been variable at 134/88 - 167/101 since hospital admission.  - she has been continued on PTA Lopressor 25mg  BID, Ramipril 10mg  daily, and Triamterne-HCTZ 37.5-25mg  daily. Will hold HCTZ as she is receiving IV Lasix.   3. HLD - Lipid Panel in 03/2017 showed total cholesterol of 194, Triglycerides 107, HDL 49, and LDL 124. - followed by PCP. Unable to tolerate Lipitor or Crestor in the past and is therefore not on statin therapy. Consider trying Pravastatin or Crestor 5mg  three times weekly.   4. AAA - CTA shows aneurysmal dilatation of the ascending thoracic aorta, measuring up to 4.3 cm.  - Annual imaging recommended.   5. Pulmonary Nodule - 3 mm pulmonary nodule along the right upper lobe noted on CT. Plan for repeat CT in 1 year.    For questions or updates, please contact [facility] Please consult [email or web address] for contact info under Cardiology/STEMI.  Signed, Ellsworth Lennox, PA-C 07/15/2017, 9:50 AM Pager: [phone]

## 2017-07-15 NOTE — Progress Notes (Signed)
CRITICAL VALUE ALERT  Critical Value:  Troponin 0.04  Date & Time Notied:  07/15/17 0272  Provider Notified: Toniann Fail  Orders Received/Actions taken:

## 2017-07-15 NOTE — Progress Notes (Signed)
Patient seen and examined at bedside. Please see earlier admission note by Dr. Toniann Fail. Pt admitted for evaluation of dyspnea and orthopnea, found to have acute CHF exacerbation. Cardiology team consulted, ECHO requested. Monitor daily weights, I/O, BMP in AM.  Debbora Presto, MD  [facility] Pager [phone]  If 7PM-7AM, please contact night-coverage [email or web address] Password [email or web address]

## 2017-07-15 NOTE — Plan of Care (Signed)
  Progressing Education: Knowledge of General Education information will improve 07/15/2017 2204 - Progressing by Cristela Felt, RN Health Behavior/Discharge Planning: Ability to manage health-related needs will improve 07/15/2017 2204 - Progressing by Cristela Felt, RN Clinical Measurements: Ability to maintain clinical measurements within normal limits will improve 07/15/2017 2204 - Progressing by Cristela Felt, RN Will remain free from infection 07/15/2017 2204 - Progressing by Cristela Felt, RN Activity: Risk for activity intolerance will decrease 07/15/2017 2204 - Progressing by Cristela Felt, RN Nutrition: Adequate nutrition will be maintained 07/15/2017 2204 - Progressing by Cristela Felt, RN Coping: Level of anxiety will decrease 07/15/2017 2204 - Progressing by Cristela Felt, RN Elimination: Will not experience complications related to bowel motility 07/15/2017 2204 - Progressing by Cristela Felt, RN Will not experience complications related to urinary retention 07/15/2017 2204 - Progressing by Cristela Felt, RN Pain Managment: General experience of comfort will improve 07/15/2017 2204 - Progressing by Cristela Felt, RN Safety: Ability to remain free from injury will improve 07/15/2017 2204 - Progressing by Cristela Felt, RN Skin Integrity: Risk for impaired skin integrity will decrease 07/15/2017 2204 - Progressing by Cristela Felt, RN Education: Ability to verbalize understanding of medication therapies will improve 07/15/2017 2204 - Progressing by Cristela Felt, RN Activity: Capacity to carry out activities will improve 07/15/2017 2204 - Progressing by Cristela Felt, RN Cardiac: Ability to achieve and maintain adequate cardiopulmonary perfusion will improve 07/15/2017 2204 - Progressing by Cristela Felt, RN

## 2017-07-15 NOTE — Progress Notes (Signed)
  Echocardiogram 2D Echocardiogram with Definity has been performed.  Sharon Yu 07/15/2017, 2:47 PM

## 2017-07-16 DIAGNOSIS — I5021 Acute systolic (congestive) heart failure: Secondary | ICD-10-CM

## 2017-07-16 DIAGNOSIS — I5041 Acute combined systolic (congestive) and diastolic (congestive) heart failure: Secondary | ICD-10-CM

## 2017-07-16 DIAGNOSIS — E1169 Type 2 diabetes mellitus with other specified complication: Secondary | ICD-10-CM

## 2017-07-16 DIAGNOSIS — R0602 Shortness of breath: Secondary | ICD-10-CM

## 2017-07-16 LAB — BASIC METABOLIC PANEL
Anion gap: 7 (ref 5–15)
BUN: 17 mg/dL (ref 6–20)
CHLORIDE: 108 mmol/L (ref 101–111)
CO2: 28 mmol/L (ref 22–32)
Calcium: 8.8 mg/dL — ABNORMAL LOW (ref 8.9–10.3)
Creatinine, Ser: 0.93 mg/dL (ref 0.44–1.00)
GFR calc Af Amer: >60 mL/min (ref >60)
GFR calc non Af Amer: >60 mL/min (ref >60)
Glucose, Bld: 110 mg/dL — ABNORMAL HIGH (ref 65–99)
POTASSIUM: 3.6 mmol/L (ref 3.5–5.1)
SODIUM: 143 mmol/L (ref 135–145)

## 2017-07-16 LAB — CBC
HCT: 38.8 % (ref 36.0–46.0)
HEMOGLOBIN: 12.5 g/dL (ref 12.0–15.0)
MCH: 28.6 pg (ref 26.0–34.0)
MCHC: 32.2 g/dL (ref 30.0–36.0)
MCV: 88.8 fL (ref 78.0–100.0)
Platelets: 188 10*3/uL (ref 150–400)
RBC: 4.37 MIL/uL (ref 3.87–5.11)
RDW: 14.1 % (ref 11.5–15.5)
WBC: 7.8 10*3/uL (ref 4.0–10.5)

## 2017-07-16 MED ORDER — ASPIRIN 81 MG PO CHEW
81.0000 mg | CHEWABLE_TABLET | ORAL | Status: AC
  Administered 2017-07-17: 81 mg via ORAL
  Filled 2017-07-16: qty 1

## 2017-07-16 MED ORDER — SODIUM CHLORIDE 0.9 % IV SOLN
INTRAVENOUS | Status: DC
  Administered 2017-07-17: 09:00:00 via INTRAVENOUS

## 2017-07-16 MED ORDER — SODIUM CHLORIDE 0.9% FLUSH: 3.0000 mL | INTRAVENOUS | Status: DC | PRN

## 2017-07-16 MED ORDER — SODIUM CHLORIDE 0.9% FLUSH
3.0000 mL | Freq: Two times a day (BID) | INTRAVENOUS | Status: DC
  Administered 2017-07-16 – 2017-07-17 (×2): 3 mL via INTRAVENOUS

## 2017-07-16 MED ORDER — SODIUM CHLORIDE 0.9 % IV SOLN: 250.0000 mL | INTRAVENOUS | Status: DC | PRN

## 2017-07-16 MED ORDER — ATORVASTATIN CALCIUM 40 MG PO TABS
40.0000 mg | ORAL_TABLET | Freq: Every day | ORAL | Status: DC
  Administered 2017-07-16: 40 mg via ORAL
  Filled 2017-07-16: qty 1

## 2017-07-16 NOTE — Plan of Care (Signed)
HF teaching and videos reviewed with patient.

## 2017-07-16 NOTE — Progress Notes (Signed)
Progress Note  Patient Name: Sharon Yu Date of Encounter: 07/16/2017  Primary Cardiologist: Hilty   Subjective   Feeling better today after diuresis. No resting dyspnea. No current CP.   Inpatient Medications    Scheduled Meds: . aspirin  81 mg Oral Daily  . enoxaparin (LOVENOX) injection  40 mg Subcutaneous Q24H  . furosemide  40 mg Intravenous BID  . metoprolol tartrate  25 mg Oral BID  . multivitamin with minerals  1 tablet Oral Daily  . potassium gluconate  595 mg Oral Daily  . ramipril  10 mg Oral Daily   Continuous Infusions:  PRN Meds: acetaminophen **OR** acetaminophen, ondansetron **OR** ondansetron (ZOFRAN) IV   Vital Signs    Vitals:   07/15/17 1433 07/15/17 2104 07/16/17 0442 07/16/17 0500  BP: (!) 145/92 (!) 137/94 (!) 142/99   Pulse: 81 92 78   Resp: 19 18 18    Temp: 98.6 F (37 C) 98 F (36.7 C) 97.9 F (36.6 C)   TempSrc: Oral Oral Oral   SpO2: 99% 98% 97%   Weight:    255 lb 15.3 oz (116.1 kg)  Height:        Intake/Output Summary (Last 24 hours) at 07/16/2017 0848 Last data filed at 07/16/2017 0600 Gross per 24 hour  Intake 480 ml  Output 800 ml  Net -320 ml   Filed Weights   07/15/17 0055 07/15/17 0500 07/16/17 0500  Weight: 260 lb 9.3 oz (118.2 kg) 260 lb 5.8 oz (118.1 kg) 255 lb 15.3 oz (116.1 kg)    Telemetry    NSR - Personally Reviewed  ECG    NSR - Personally Reviewed  Physical Exam   GEN: No acute distress.  Obese  Neck: No JVD Cardiac: RRR, no murmurs, rubs, or gallops.  Respiratory: Clear to auscultation bilaterally. GI: Soft, nontender, non-distended  MS: No edema; No deformity. Neuro:  Nonfocal  Psych: Normal affect   Labs    Chemistry Recent Labs  Lab 07/14/17 1852 07/15/17 0412 07/16/17 0426  NA 140 141 143  K 3.6 3.3* 3.6  CL 109 108 108  CO2 25 25 28   GLUCOSE 104* 99 110*  BUN 19 15 17   CREATININE 1.01* 0.94 0.93  CALCIUM 8.9 8.6* 8.8*  PROT 7.6  --   --   ALBUMIN 3.9  --   --     AST 18  --   --   ALT 21  --   --   ALKPHOS 77  --   --   BILITOT 0.3  --   --   GFRNONAA 58* >60 >60  GFRAA >60 >60 >60  ANIONGAP 6 8 7      Hematology Recent Labs  Lab 07/14/17 1852 07/15/17 0412 07/16/17 0426  WBC 9.2 7.6 7.8  RBC 4.60 4.41 4.37  HGB 13.3 12.6 12.5  HCT 40.8 39.4 38.8  MCV 88.7 89.3 88.8  MCH 28.9 28.6 28.6  MCHC 32.6 32.0 32.2  RDW 14.1 14.1 14.1  PLT 208 183 188    Cardiac Enzymes Recent Labs  Lab 07/14/17 1852 07/15/17 0412 07/15/17 0856 07/15/17 1511  TROPONINI 0.05* 0.04* 0.04* 0.04*   No results for input(s): TROPIPOC in the last 168 hours.   BNP Recent Labs  Lab 07/14/17 1852  BNP 330.5*     DDimer  Recent Labs  Lab 07/14/17 1852  DDIMER 0.71*     Radiology    Dg Chest 2 View  Result Date: 07/14/2017 CLINICAL DATA:  66 year old female with shortness of breath. EXAM: CHEST  2 VIEW COMPARISON:  Earlier chest radiograph dated 07/14/2017 FINDINGS: There is no focal consolidation, pleural effusion, or pneumothorax. Top-normal cardiac size. No vascular congestion or edema there is degenerative changes of the shoulders hand AC joints. No acute osseous pathology. IMPRESSION: No active cardiopulmonary disease. Electronically Signed   By: Elgie Collard M.D.   On: 07/14/2017 18:39   Ct Angio Chest Pe W And/or Wo Contrast  Result Date: 07/14/2017 CLINICAL DATA:  Shortness of breath for 2 weeks. EXAM: CT ANGIOGRAPHY CHEST WITH CONTRAST TECHNIQUE: Multidetector CT imaging of the chest was performed using the standard protocol during bolus administration of intravenous contrast. Multiplanar CT image reconstructions and MIPs were obtained to evaluate the vascular anatomy. CONTRAST:  16mL ISOVUE-370 IOPAMIDOL (ISOVUE-370) INJECTION 76% COMPARISON:  Chest x-ray from same day. FINDINGS: Cardiovascular: Satisfactory opacification of the pulmonary arteries to the segmental level. No evidence of pulmonary embolism. Normal heart size. No pericardial  effusion. Mild aneurysmal dilatation of the ascending thoracic aorta, measuring up to 4.3 cm. Mediastinum/Nodes: No enlarged mediastinal, hilar, or axillary lymph nodes. Thyroid gland, trachea, and esophagus demonstrate no significant findings. Lungs/Pleura: There is a 3 mm pulmonary nodule in the right upper lobe (series 5, image 44). Mild bibasilar atelectasis. Trace bilateral pleural effusions. No focal consolidation or pneumothorax. Upper Abdomen:  No acute abnormality.  Prior cholecystectomy. Musculoskeletal: No chest wall abnormality. No acute or significant osseous findings. Degenerative changes of the thoracic spine with evidence of DISH is. Review of the MIP images confirms the above findings. IMPRESSION: 1. No evidence of pulmonary embolism. 2. Trace bilateral pleural effusions. 3. Mild aneurysmal dilatation of the ascending thoracic aorta, measuring up to 4.3 cm. Recommend annual imaging followup by CTA or MRA. This recommendation follows 2010 ACCF/AHA/AATS/ACR/ASA/SCA/SCAI/SIR/STS/SVM Guidelines for the Diagnosis and Management of Patients with Thoracic Aortic Disease. Circulation. 2010; 121: e266-e369 4. 3 mm pulmonary nodule in the right upper lobe. No follow-up needed if patient is low-risk. Non-contrast chest CT can be considered in 12 months if patient is high-risk. This recommendation follows the consensus statement: Guidelines for Management of Incidental Pulmonary Nodules Detected on CT Images: From the Fleischner Society 2017; Radiology 2017; 284:228-243. Electronically Signed   By: Obie Dredge M.D.   On: 07/14/2017 20:47    Cardiac Studies   2D Echo 07/15/17 Study Conclusions  - Left ventricle: The cavity size was moderately dilated. Systolic   function was severely reduced. The estimated ejection fraction   was in the range of 25% to 30%. Features are consistent with a   pseudonormal left ventricular filling pattern, with concomitant   abnormal relaxation and increased filling  pressure (grade 2   diastolic dysfunction). Doppler parameters are consistent with   high ventricular filling pressure. - Aortic valve: Transvalvular velocity was within the normal range.   There was no stenosis. There was mild regurgitation. - Mitral valve: Transvalvular velocity was within the normal range.   There was no evidence for stenosis. There was trivial   regurgitation. - Left atrium: The atrium was mildly dilated. - Right ventricle: The cavity size was normal. Wall thickness was   normal. Systolic function was normal. - Tricuspid valve: There was no regurgitation. - Pulmonary arteries: Systolic pressure was within the normal   range. PA peak pressure: 34 mm Hg (S). - Pericardium, extracardiac: A trivial pericardial effusion was   identified.   Patient Profile     Sharon Yu is a 66 y.o. female with  past medical history of HTN, HLD, and arthritis, admitted for acute CHF.   Assessment & Plan    1. Acute Combined Systolic and Diastolic CHF: Admit BNP 330. 2D echo yesterday c/w elevated ventricular filling pressures. She notes symptomatic improvement with diuresis. No resting dyspnea but still with mild exertional dyspnea. Weight is down from admit weight of 262 lb to 255 lb today.  Renal function, K and BP all stable. Continue diuresis with IV Lasix. I have placed order for strict I/Os to help tract UOP to guide diuresis. Recommend low sodium diet. Keep BP controlled and monitor electrolytes while diuresing.   2. Cardiomyopathy: newly diagnosed. 2D echo yesterday showed reduced LVEF down to 25-30%. Grade 2DD also noted. No prior echocardiograms for comparison. Given her LV dysfunction and other cardiac risk factors, she will need LHC to exclude underlying CAD.  Also recommend guidelines directed medical therapy for systolic HF. She is currently on BB therapy w/ Lopressor, however recommend change to either Toprol XL or Coreg. She is also on ACE-I therapy with ramipril. May  consider change to [facility]. This would require a 36 hr ACE-I washout period. We may also consider addition of spironolactone, hydralazine and nitrates as BP allows.   3. HTN: has been mildly elevated. Continue to diurese with IV Lasix and continue BB, ACE/ARB therapy. May ultimately add additional HF/ antihypertensive meds, if BP allows. Will continue to monitor.   4. Elevated Troponin: flat low level trend at 0.04>>0.04>>0.04. Suspect this is 2/2 to demand ischemia in the setting of acute CHF, however given her new cardiomyopathy and other cardiac risk factors, she will need ischemic evaluation to r/o CAD.   5. Aneurysmal dilatation of the ascending thoracic aorta: incidental finding on chest CT, measuring up to 4.3 cm. Recommend annual imaging followup by CTA or MRA. Continue on BB therapy and keep BP well controlled.   6. HLD: [facility] lipid [facility]/2017 showed calculated LDL at 124 mg/dL. Given her risk factors and heart disease, she would benefit from addition of statin therapy. HFTs this admit are normal. Will add Lipitor 40 mg. If she is found to have CAD after w/u, her LDL goal will be < 70 mg/dL.   7. DM: records in Care Everywhere reviewed. In 2016, she had a Hgb A1c level at 7.0. She is not on any diabetes medications. Given her heart disease and other risk factors, this will need to be monitored. Will defer w/u to primary team.   For questions or updates, please contact [facility] Please consult [email or web address] for contact info under Cardiology/STEMI.      Signed, Robbie LisBrittainy Keelee Yankey, PA-C  07/16/2017, 8:48 AM

## 2017-07-16 NOTE — Progress Notes (Signed)
[facility] PROGRESS NOTE  Sharon Yu ZOX:[id number] DOB: 06/11/1951 DOA: 07/14/2017 PCP: Pediatrics, [facility]  Interim summary and HPI 66 y.o. female with history of hypertension presents to the ER with increasing shortness of breath over the last 2 weeks.  Patient states she has been getting chest tightness with shortness of breath on exertion over the last 2 weeks denies any associated productive cough fever or chills.  Shortness of breath also worsens on lying flat.  Troponin mildly elevated, elevated BNP and vascular congestion seen on CXR. Admitted for further work up of acute heart failure.  Assessment/Plan: 1-acute combined systolic and diastolic HF -will continue IV lasix -continue b-blocker and ACE -EF 25-30% -follow daily weights and strict I's and O's -cardiology on board and will follow rec's; planning fpr heart cath. -follow low sodium diet   2-HTN -stable -continue b-blocker and ACE inhibitor -follow heart healthy diet  3-morbid obesity -Body mass index is 40.09 kg/m. -low calorie diet and exercise reccommended  4-elevated troponin -appears to be secondary to demand ischemia from CHF. -cardiology on board, will follow rec's and cath results.  5-HLD -started on lipitor  6-type 2 DM -last A1C 7.0 -was following only diet control -will need metformin at discharge  Code Status: Full Family Communication: no family at bedside   Disposition Plan: [facility] inpatient, continue cardiology rec's. Continue IV diuresis. Per notes planning for left and rig         Cardiology  Procedures:  See below for  X-ray report   2-D echo: 25-30% EF, grade 2 diastolic dysfunction.                                                                                         Antibiotics:  None                                       HPI/Subjective: Afebrile, no SOB, no CP, no orthopnea. No nausea, no vomiting.  Objective: Vitals:   07/16/17 1456  07/16/17 2217  BP: 140/81 131/86  Pulse: 80 85  Resp: 18 20  Temp: 98.1 F (36.7 C) 98 F (36.7 C)  SpO2: 100% 97%    Intake/Output Summary (Last 24 hours) at 07/16/2017 2234 Last data filed at 07/16/2017 1842 Gross per 24 hour  Intake 720 ml  Output 2600 ml  Net -1880 ml   Filed Weights   07/15/17 0055 07/15/17 0500 07/16/17 0500  Weight: 118.2 kg (260 lb 9.3 oz) 118.1 kg (260 lb 5.8 oz) 116.1 kg (255 lb 15.3 oz)    Exam:   General:afebrile, no nausea, no vomiting, reporting improvement on her SOB and orthopnea. Patient denies CP.  Cardiovascular: S1 and S2, no rubs, no gallops  Respiratory: improved air movement, no wheezing, very fine crackles at bases, no rhonchi  Abdomen: soft, obese, NT, ND, positive BS  Musculoskeletal: 1 plus edema, no cyanosis, no clubbing.   Data Reviewed: Basic Metabolic Panel: Recent Labs  Lab 07/14/17 1852 07/15/17 0412 07/16/17 0426  NA 140 141 143  K 3.6 3.3*  3.6  CL 109 108 108  CO2 25 25 28   GLUCOSE 104* 99 110*  BUN 19 15 17   CREATININE 1.01* 0.94 0.93  CALCIUM 8.9 8.6* 8.8*  MG  --  1.8  --    Liver Function Tests: Recent Labs  Lab 07/14/17 1852  AST 18  ALT 21  ALKPHOS 77  BILITOT 0.3  PROT 7.6  ALBUMIN 3.9   CBC: Recent Labs  Lab 07/14/17 1852 07/15/17 0412 07/16/17 0426  WBC 9.2 7.6 7.8  NEUTROABS 5.3  --   --   HGB 13.3 12.6 12.5  HCT 40.8 39.4 38.8  MCV 88.7 89.3 88.8  PLT 208 183 188   Cardiac Enzymes: Recent Labs  Lab 07/14/17 1852 07/15/17 0412 07/15/17 0856 07/15/17 1511  TROPONINI 0.05* 0.04* 0.04* 0.04*   BNP (last 3 results) Recent Labs    07/14/17 1852  BNP 330.5*    Studies: No results found.  Scheduled Meds: . aspirin  81 mg Oral Daily  . [START ON 07/17/2017] aspirin  81 mg Oral Pre-Cath  . atorvastatin  40 mg Oral q1800  . enoxaparin (LOVENOX) injection  40 mg Subcutaneous Q24H  . furosemide  40 mg Intravenous BID  . metoprolol tartrate  25 mg Oral BID  .  multivitamin with minerals  1 tablet Oral Daily  . potassium gluconate  595 mg Oral Daily  . ramipril  10 mg Oral Daily  . sodium chloride flush  3 mL Intravenous Q12H   Continuous Infusions: . sodium chloride    . [START ON 07/17/2017] sodium chloride      Principal Problem:   CHF exacerbation (HCC) Active Problems:   Hypertension   Elevated troponin   CHF (congestive heart failure) (HCC)   Ascending aortic aneurysm (HCC)    Time spent: 30 minutes    Vassie Loll  [facility] Pager [phone]. If 7PM-7AM, please contact night-coverage at [email or web address], password [facility] 07/16/2017, 10:34 PM  LOS: 1 day

## 2017-07-16 NOTE — Care Management Note (Signed)
Case Management Note  Patient Details  Name: Dontia Zortman MRN: [id number] Date of Birth: [birth year]  Subjective/Objective:  66 y/o f admitted w/CHF. From home.                  Action/Plan:d/c plan home.   Expected Discharge Date:                  Expected Discharge Plan:  Home/Self Care  In-House Referral:     Discharge planning Services  CM Consult  Post Acute Care Choice:    Choice offered to:     DME Arranged:    DME Agency:     HH Arranged:    HH Agency:     Status of Service:  In process, will continue to follow  If discussed at Long Length of Stay Meetings, dates discussed:    Additional Comments:  Lanier Clam, RN 07/16/2017, 11:28 AM

## 2017-07-16 NOTE — Plan of Care (Signed)
  Progressing Education: Ability to demonstrate management of disease process will improve 07/16/2017 1652 - Progressing by Charmian Muff, RN Ability to verbalize understanding of medication therapies will improve 07/16/2017 1652 - Progressing by Charmian Muff, RN Activity: Capacity to carry out activities will improve 07/16/2017 1652 - Progressing by Charmian Muff, RN Cardiac: Ability to achieve and maintain adequate cardiopulmonary perfusion will improve 07/16/2017 1652 - Progressing by Charmian Muff, RN

## 2017-07-17 ENCOUNTER — Encounter (HOSPITAL_COMMUNITY)
Admission: EM | Disposition: A | Discharge status: Home / Self Care | Payer: Self-pay | Source: Home / Self Care | Attending: Internal Medicine

## 2017-07-17 DIAGNOSIS — I5023 Acute on chronic systolic (congestive) heart failure: Secondary | ICD-10-CM

## 2017-07-17 DIAGNOSIS — E785 Hyperlipidemia, unspecified: Secondary | ICD-10-CM

## 2017-07-17 HISTORY — PX: RIGHT/LEFT HEART CATH AND CORONARY ANGIOGRAPHY: CATH118266

## 2017-07-17 LAB — BASIC METABOLIC PANEL
Anion gap: 8 (ref 5–15)
BUN: 25 mg/dL — AB (ref 6–20)
CHLORIDE: 104 mmol/L (ref 101–111)
CO2: 28 mmol/L (ref 22–32)
Calcium: 9.1 mg/dL (ref 8.9–10.3)
Creatinine, Ser: 1.08 mg/dL — ABNORMAL HIGH (ref 0.44–1.00)
GFR calc Af Amer: >60 mL/min (ref >60)
GFR calc non Af Amer: 53 mL/min — ABNORMAL LOW (ref >60)
GLUCOSE: 116 mg/dL — AB (ref 65–99)
POTASSIUM: 3.6 mmol/L (ref 3.5–5.1)
Sodium: 140 mmol/L (ref 135–145)

## 2017-07-17 LAB — POCT I-STAT 3, ART BLOOD GAS (G3+)
Acid-Base Excess: 7 mmol/L — ABNORMAL HIGH (ref 0.0–2.0)
Bicarbonate: 32.7 mmol/L — ABNORMAL HIGH (ref 20.0–28.0)
O2 SAT: 100 %
PCO2 ART: 48.7 mmHg — AB (ref 32.0–48.0)
PH ART: 7.435 (ref 7.350–7.450)
PO2 ART: 172 mmHg — AB (ref 83.0–108.0)
TCO2: 34 mmol/L — ABNORMAL HIGH (ref 22–32)

## 2017-07-17 LAB — HEMOGLOBIN A1C
Hgb A1c MFr Bld: 5.8 % — ABNORMAL HIGH (ref 4.8–5.6)
MEAN PLASMA GLUCOSE: 119.76 mg/dL

## 2017-07-17 LAB — PROTIME-INR
INR: 0.93
PROTHROMBIN TIME: 12.4 s (ref 11.4–15.2)

## 2017-07-17 LAB — POCT I-STAT 3, VENOUS BLOOD GAS (G3P V)
Acid-Base Excess: 8 mmol/L — ABNORMAL HIGH (ref 0.0–2.0)
BICARBONATE: 34.5 mmol/L — AB (ref 20.0–28.0)
O2 Saturation: 73 %
PCO2 VEN: 53 mmHg (ref 44.0–60.0)
PO2 VEN: 39 mmHg (ref 32.0–45.0)
TCO2: 36 mmol/L — ABNORMAL HIGH (ref 22–32)
pH, Ven: 7.421 (ref 7.250–7.430)

## 2017-07-17 LAB — POCT ACTIVATED CLOTTING TIME: Activated Clotting Time: 186 seconds

## 2017-07-17 SURGERY — RIGHT/LEFT HEART CATH AND CORONARY ANGIOGRAPHY
Anesthesia: LOCAL

## 2017-07-17 MED ORDER — HEPARIN SODIUM (PORCINE) 1000 UNIT/ML IJ SOLN
INTRAMUSCULAR | Status: AC
  Filled 2017-07-17: qty 1

## 2017-07-17 MED ORDER — IOPAMIDOL (ISOVUE-370) INJECTION 76%
INTRAVENOUS | Status: AC
  Filled 2017-07-17: qty 100

## 2017-07-17 MED ORDER — FENTANYL CITRATE (PF) 100 MCG/2ML IJ SOLN
INTRAMUSCULAR | Status: DC | PRN
  Administered 2017-07-17: 25 ug via INTRAVENOUS

## 2017-07-17 MED ORDER — SODIUM CHLORIDE 0.9 % IV SOLN: 250.0000 mL | INTRAVENOUS | Status: DC | PRN

## 2017-07-17 MED ORDER — IOPAMIDOL (ISOVUE-370) INJECTION 76%
INTRAVENOUS | Status: DC | PRN
  Administered 2017-07-17: 60 mL via INTRA_ARTERIAL

## 2017-07-17 MED ORDER — LIDOCAINE HCL (PF) 1 % IJ SOLN
INTRAMUSCULAR | Status: AC
  Filled 2017-07-17: qty 30

## 2017-07-17 MED ORDER — SODIUM CHLORIDE 0.9% FLUSH: 3.0000 mL | INTRAVENOUS | Status: DC | PRN

## 2017-07-17 MED ORDER — VERAPAMIL HCL 2.5 MG/ML IV SOLN
INTRAVENOUS | Status: AC
  Filled 2017-07-17: qty 2

## 2017-07-17 MED ORDER — HEPARIN (PORCINE) IN NACL 2-0.9 UNIT/ML-% IJ SOLN
INTRAMUSCULAR | Status: AC
  Filled 2017-07-17: qty 1000

## 2017-07-17 MED ORDER — HEPARIN (PORCINE) IN NACL 2-0.9 UNIT/ML-% IJ SOLN
INTRAMUSCULAR | Status: AC | PRN
  Administered 2017-07-17: 1000 mL

## 2017-07-17 MED ORDER — SODIUM CHLORIDE 0.9% FLUSH
3.0000 mL | Freq: Two times a day (BID) | INTRAVENOUS | Status: DC
  Administered 2017-07-18: 3 mL via INTRAVENOUS

## 2017-07-17 MED ORDER — FENTANYL CITRATE (PF) 100 MCG/2ML IJ SOLN
INTRAMUSCULAR | Status: AC
  Filled 2017-07-17: qty 2

## 2017-07-17 MED ORDER — LIDOCAINE HCL (PF) 1 % IJ SOLN
INTRAMUSCULAR | Status: DC | PRN
  Administered 2017-07-17: 5 mL

## 2017-07-17 MED ORDER — FUROSEMIDE 40 MG PO TABS
40.0000 mg | ORAL_TABLET | Freq: Every day | ORAL | Status: DC
  Administered 2017-07-18: 40 mg via ORAL
  Filled 2017-07-17: qty 1

## 2017-07-17 MED ORDER — VERAPAMIL HCL 2.5 MG/ML IV SOLN
INTRAVENOUS | Status: DC | PRN
  Administered 2017-07-17: 10 mL via INTRA_ARTERIAL

## 2017-07-17 MED ORDER — HEPARIN SODIUM (PORCINE) 5000 UNIT/ML IJ SOLN
5000.0000 [IU] | Freq: Three times a day (TID) | INTRAMUSCULAR | Status: DC
  Administered 2017-07-18: 5000 [IU] via SUBCUTANEOUS
  Filled 2017-07-17: qty 1

## 2017-07-17 MED ORDER — MIDAZOLAM HCL 2 MG/2ML IJ SOLN
INTRAMUSCULAR | Status: AC
  Filled 2017-07-17: qty 2

## 2017-07-17 MED ORDER — SODIUM CHLORIDE 0.9 % WEIGHT BASED INFUSION: 1.0000 mL/kg/h | INTRAVENOUS | Status: AC

## 2017-07-17 MED ORDER — HEPARIN SODIUM (PORCINE) 1000 UNIT/ML IJ SOLN
INTRAMUSCULAR | Status: DC | PRN
  Administered 2017-07-17: 5000 [IU] via INTRAVENOUS

## 2017-07-17 MED ORDER — MIDAZOLAM HCL 2 MG/2ML IJ SOLN
INTRAMUSCULAR | Status: DC | PRN
  Administered 2017-07-17: 1 mg via INTRAVENOUS

## 2017-07-17 SURGICAL SUPPLY — 12 items
CATH 5FR JL3.5 JR4 ANG PIG MP (CATHETERS) ×2 IMPLANT
CATH BALLN WEDGE 5F 110CM (CATHETERS) ×2 IMPLANT
DEVICE RAD COMP TR BAND LRG (VASCULAR PRODUCTS) ×2 IMPLANT
GLIDESHEATH SLEND SS 6F .021 (SHEATH) ×2 IMPLANT
GUIDEWIRE INQWIRE 1.5J.035X260 (WIRE) ×1 IMPLANT
INQWIRE 1.5J .035X260CM (WIRE) ×2
KIT HEART LEFT (KITS) ×2 IMPLANT
PACK CARDIAC CATHETERIZATION (CUSTOM PROCEDURE TRAY) ×2 IMPLANT
SHEATH GLIDE SLENDER 4/5FR (SHEATH) ×2 IMPLANT
SYR MEDRAD MARK V 150ML (SYRINGE) ×2 IMPLANT
TRANSDUCER W/STOPCOCK (MISCELLANEOUS) ×2 IMPLANT
TUBING CIL FLEX 10 FLL-RA (TUBING) ×2 IMPLANT

## 2017-07-17 NOTE — Progress Notes (Signed)
[facility] PROGRESS NOTE  Ignacia FellingMary Ann Meinders [id number] DOB: Oct 29, 1950 DOA: 07/14/2017 PCP: Pediatrics, [facility]  Interim summary and HPI 66 y.o. female with history of hypertension presents to the ER with increasing shortness of breath over the last 2 weeks.  Patient states she has been getting chest tightness with shortness of breath on exertion over the last 2 weeks denies any associated productive cough fever or chills.  Shortness of breath also worsens on lying flat.  Troponin mildly elevated, elevated BNP and vascular congestion seen on CXR. Admitted for further work up of acute heart failure.  Assessment/Plan: 1-acute combined systolic and diastolic HF -Cr bump up; breathing stable. -will recommend transitioning to oral diuretics; will follow cardiology lead and recommendations.  -continue b-blocker and ACE -EF 25-30% -follow daily weights and strict I's and O's -cardiology on board and will follow rec's; planning for heart cath later today -follow low sodium diet   2-HTN -stable -continue b-blocker and ACE inhibitor -follow heart healthy diet -ongoing diuresis also helping controlling BP  3-morbid obesity -Body mass index is 39.44 kg/m. -low calorie diet and exercise reccommended  4-elevated troponin -appears to be secondary to demand ischemia from CHF. -cardiology on board, will follow rec's and cath results. -patient is ready for procedure later today  5-HLD -continue lipitor  6-type 2 DM -last A1C 7.0 -was following only diet control prior to admission  -will plan to discharge on metformin -close follow up with PCP for further adjustment on hypoglycemic regimen   Code Status: Full Family Communication: no family at bedside  Disposition Plan: [facility] inpatient, will follow cardiology rec's. With slight bumped in Cr I believe patient can be transitioned to oral diuretics. Will follow results of heart cath. Repeat BMET in  am.  Consultant   Cardiology  Procedures:  See below for  X-ray report   2-D echo: 25-30% EF, grade 2 diastolic dysfunction.   Heart cath: plan for later today                                                                                         Antibiotics:  None                                       HPI/Subjective: No fever, no CP, reports no nausea or vomiting. Breathing improved and essentially back to normal as per patient reports.  Objective: Vitals:   07/17/17 1610 07/17/17 1625  BP: (!) 160/98 (!) 147/71  Pulse: 94 91  Resp: (!) 24 (!) 21  Temp:    SpO2: 99% 99%    Intake/Output Summary (Last 24 hours) at 07/17/2017 1708 Last data filed at 07/17/2017 1000 Gross per 24 hour  Intake 495 ml  Output 1400 ml  Net -905 ml   Filed Weights   07/15/17 0500 07/16/17 0500 07/17/17 0723  Weight: 118.1 kg (260 lb 5.8 oz) 116.1 kg (255 lb 15.3 oz) 114.2 kg (251 lb 12.8 oz)    Exam:   General:no fever, no CP and reports breathing is back to her  baseline. patient seen lying flat on bed. In agreement to pursuit heart cath later today.  Cardiovascular: S1 and S2, no rubs, no gallops, no JVD aprpeciated  Respiratory: improved air movement, no wheezing, no frank crackles.  Abdomen: soft, obese, NT, ND, positive BS.  Musculoskeletal: trace edema bilaterally, no wheezing, no clubbing    Data Reviewed: Basic Metabolic Panel: Recent Labs  Lab 07/14/17 1852 07/15/17 0412 07/16/17 0426 07/17/17 0419  NA 140 141 143 140  K 3.6 3.3* 3.6 3.6  CL 109 108 108 104  CO2 25 25 28 28   GLUCOSE 104* 99 110* 116*  BUN 19 15 17  25*  CREATININE 1.01* 0.94 0.93 1.08*  CALCIUM 8.9 8.6* 8.8* 9.1  MG  --  1.8  --   --    Liver Function Tests: Recent Labs  Lab 07/14/17 1852  AST 18  ALT 21  ALKPHOS 77  BILITOT 0.3  PROT 7.6  ALBUMIN 3.9   CBC: Recent Labs  Lab 07/14/17 1852 07/15/17 0412 07/16/17 0426  WBC 9.2 7.6 7.8  NEUTROABS 5.3  --   --   HGB 13.3 12.6  12.5  HCT 40.8 39.4 38.8  MCV 88.7 89.3 88.8  PLT 208 183 188   Cardiac Enzymes: Recent Labs  Lab 07/14/17 1852 07/15/17 0412 07/15/17 0856 07/15/17 1511  TROPONINI 0.05* 0.04* 0.04* 0.04*   BNP (last 3 results) Recent Labs    07/14/17 1852  BNP 330.5*    Studies: No results found.  Scheduled Meds: . [MAR Hold] aspirin  81 mg Oral Daily  . [MAR Hold] atorvastatin  40 mg Oral q1800  . [MAR Hold] enoxaparin (LOVENOX) injection  40 mg Subcutaneous Q24H  . [MAR Hold] furosemide  40 mg Intravenous BID  . [MAR Hold] metoprolol tartrate  25 mg Oral BID  . [MAR Hold] multivitamin with minerals  1 tablet Oral Daily  . [MAR Hold] potassium gluconate  595 mg Oral Daily  . [MAR Hold] ramipril  10 mg Oral Daily  . sodium chloride flush  3 mL Intravenous Q12H   Continuous Infusions: . sodium chloride    . sodium chloride 10 mL/hr at 07/17/17 0830  . sodium chloride      Principal Problem:   CHF exacerbation (HCC) Active Problems:   Hypertension   Elevated troponin   CHF (congestive heart failure) (HCC)   Ascending aortic aneurysm (HCC)    Time spent: 25 minutes    Vassie Loll  [facility] Pager [phone]. If 7PM-7AM, please contact night-coverage at [email or web address], password [facility] 07/17/2017, 5:08 PM  LOS: 2 days

## 2017-07-17 NOTE — Progress Notes (Addendum)
Site area: Right Brachia a 5 french venous sheath was removed  Site Prior to Removal:  Level 0  Pressure Applied For 20 MINUTES    Bedrest Beginning at 1630p  Manual:   Yes.    Patient Status During Pull:  stable  Post Pull Groin Site:  Level 0  Post Pull Instructions Given:  Yes.    Post Pull Pulses Present:  Yes.    Dressing Applied:  Yes.    Comments:  VS remain stable

## 2017-07-17 NOTE — Progress Notes (Signed)
TR BAND REMOVAL  LOCATION:   Right radial  DEFLATED PER PROTOCOL:    Yes.    TIME BAND OFF / DRESSING APPLIED:    1845p Clean tegaderm and Coban   SITE UPON ARRIVAL:    Level 0  SITE AFTER BAND REMOVAL:    Level 0  CIRCULATION SENSATION AND MOVEMENT:    Within Normal Limits   Yes.    COMMENTS:   Pt denies any discomfort at site

## 2017-07-17 NOTE — Progress Notes (Signed)
PeriWick applied and connected to suction per protocol.

## 2017-07-17 NOTE — H&P (View-Only) (Signed)
Progress Note  Patient Name: Sharon Yu Date of Encounter: 07/17/2017  Primary Cardiologist: Dr. Rennis GoldenHilty ([place])  Subjective   No complaints. Feels good today. Dyspnea resolved. Currently CP free. No additional questions regarding cath procedure today.   Inpatient Medications    Scheduled Meds: . aspirin  81 mg Oral Daily  . atorvastatin  40 mg Oral q1800  . enoxaparin (LOVENOX) injection  40 mg Subcutaneous Q24H  . furosemide  40 mg Intravenous BID  . metoprolol tartrate  25 mg Oral BID  . multivitamin with minerals  1 tablet Oral Daily  . potassium gluconate  595 mg Oral Daily  . ramipril  10 mg Oral Daily  . sodium chloride flush  3 mL Intravenous Q12H   Continuous Infusions: . sodium chloride    . sodium chloride     PRN Meds: sodium chloride, acetaminophen **OR** acetaminophen, ondansetron **OR** ondansetron (ZOFRAN) IV, sodium chloride flush   Vital Signs    Vitals:   07/16/17 1456 07/16/17 2217 07/17/17 0651 07/17/17 0723  BP: 140/81 131/86 (!) 138/96   Pulse: 80 85 81   Resp: 18 20 18    Temp: 98.1 F (36.7 C) 98 F (36.7 C) 97.9 F (36.6 C)   TempSrc: Oral Oral Oral   SpO2: 100% 97% 100%   Weight:    251 lb 12.8 oz (114.2 kg)  Height:        Intake/Output Summary (Last 24 hours) at 07/17/2017 0807 Last data filed at 07/16/2017 1842 Gross per 24 hour  Intake 480 ml  Output 1800 ml  Net -1320 ml   Filed Weights   07/15/17 0500 07/16/17 0500 07/17/17 0723  Weight: 260 lb 5.8 oz (118.1 kg) 255 lb 15.3 oz (116.1 kg) 251 lb 12.8 oz (114.2 kg)    Telemetry    NSR w/ occasional PVCs - Personally Reviewed  ECG    NSR- Personally Reviewed  Physical Exam   GEN: No acute distress.  Moderately obese  Neck: No JVD Cardiac: RRR, no murmurs, rubs, or gallops.  Respiratory: Clear to auscultation bilaterally. GI: Soft, nontender, non-distended  MS: No edema; No deformity. Neuro:  Nonfocal  Psych: Normal affect   Labs    Chemistry Recent  Labs  Lab 07/14/17 1852 07/15/17 0412 07/16/17 0426 07/17/17 0419  NA 140 141 143 140  K 3.6 3.3* 3.6 3.6  CL 109 108 108 104  CO2 25 25 28 28   GLUCOSE 104* 99 110* 116*  BUN 19 15 17  25*  CREATININE 1.01* 0.94 0.93 1.08*  CALCIUM 8.9 8.6* 8.8* 9.1  PROT 7.6  --   --   --   ALBUMIN 3.9  --   --   --   AST 18  --   --   --   ALT 21  --   --   --   ALKPHOS 77  --   --   --   BILITOT 0.3  --   --   --   GFRNONAA 58* >60 >60 53*  GFRAA >60 >60 >60 >60  ANIONGAP 6 8 7 8      Hematology Recent Labs  Lab 07/14/17 1852 07/15/17 0412 07/16/17 0426  WBC 9.2 7.6 7.8  RBC 4.60 4.41 4.37  HGB 13.3 12.6 12.5  HCT 40.8 39.4 38.8  MCV 88.7 89.3 88.8  MCH 28.9 28.6 28.6  MCHC 32.6 32.0 32.2  RDW 14.1 14.1 14.1  PLT 208 183 188    Cardiac Enzymes Recent Labs  Lab 07/14/17 1852 07/15/17 0412 07/15/17 0856 07/15/17 1511  TROPONINI 0.05* 0.04* 0.04* 0.04*   No results for input(s): TROPIPOC in the last 168 hours.   BNP Recent Labs  Lab 07/14/17 1852  BNP 330.5*     DDimer  Recent Labs  Lab 07/14/17 1852  DDIMER 0.71*     Radiology    No results found.  Cardiac Studies   2D Echo 07/15/17 Study Conclusions  - Left ventricle: The cavity size was moderately dilated. Systolic function was severely reduced. The estimated ejection fraction was in the range of 25% to 30%. Features are consistent with a pseudonormal left ventricular filling pattern, with concomitant abnormal relaxation and increased filling pressure (grade 2 diastolic dysfunction). Doppler parameters are consistent with high ventricular filling pressure. - Aortic valve: Transvalvular velocity was within the normal range. There was no stenosis. There was mild regurgitation. - Mitral valve: Transvalvular velocity was within the normal range. There was no evidence for stenosis. There was trivial regurgitation. - Left atrium: The atrium was mildly dilated. - Right ventricle: The  cavity size was normal. Wall thickness was normal. Systolic function was normal. - Tricuspid valve: There was no regurgitation. - Pulmonary arteries: Systolic pressure was within the normal range. PA peak pressure: 34 mm Hg (S). - Pericardium, extracardiac: A trivial pericardial effusion was identified.    Patient Profile     Sharon Browder Pearsonis a 66 y.o.femalewith past medical history of HTN, HLD, DM and arthritis, admitted for acute CHF.   Found to have new cardiomyopathy, EF 25-30%.   Assessment & Plan    1. Acute Combined Systolic and Diastolic CHF: EF 91-47% w/ G2DD noted on echo. Breathing improved with diuresis. -1.8 L out yesterday. Appears euvolemic on exam. RHC measurements today will help to determine if she will need further diuresis w/ IV lasix.   2. Cardiomyopathy: newly diagnosed. EF 25-30%. Plan for LHC today to r/o underlying CAD to see if etiology is ischemic vs nonischemic. Plan for guidelines directed medical therapy for systolic HF. Continue BB and ACE-I. Change BB from Lopressor to either long acting Toprol XL vs Coreg. Will also later try to add spironolactone, hydralazine and nitrate if BP permits.   3. Elevated Troponin: flat low level trend c/w demand ischemia in the setting of acute CHF. However plan for LHC today to r/o CAD given new cardiomyopathy.   4. HTN: controlled. Continue to monitor.   5. DM: Type 2. Has been diet controlled. Last Hgb A1c was 7. Repeat lab pending. She is on an ACE-I. Statin also added.  6. HLD: LDL 03/2017 at [facility] was 127 mg/dL. Hepatic function normal. Lipitor 40 mg added. Recheck FLP and HFTs in 6 weeks.   7. Aneurysmal dilatation of the ascending thoracic aorta: incidental finding on chest CT, measuring up to 4.3 cm. Recommend annual imaging followup by CTA or MRA. Continue on BB therapy and keep BP well controlled.   8. Renal Insuffiencey: slight bump in SCr/BUN with diuresis. 1.08/25 today. Plan for LHC today w/  contrast dye. Monitor renal function post cath.   7. LHC: transfer to [facility] later today for procedure. Keep NPO.   I have reviewed the risks, indications, and alternatives to cardiac catheterization and possible angioplasty/stenting with the patient. Risks include but are not limited to bleeding, infection, vascular injury, stroke, myocardial infection, arrhythmia, kidney injury, radiation-related injury in the case of prolonged fluoroscopy use, emergency cardiac surgery, and death. The patient understands the risks of serious complication is  low (<1%).    For questions or updates, please contact [facility] Please consult [email or web address] for contact info under Cardiology/STEMI.      Signed, Robbie Lis, PA-C  07/17/2017, 8:07 AM

## 2017-07-17 NOTE — Progress Notes (Signed)
The left and right groin and the right radial wrist were "clipped".

## 2017-07-17 NOTE — Progress Notes (Signed)
Received pt from [facility] alert and oriented X4.  Pt put on monitor and IVF started.  Consent signed.

## 2017-07-17 NOTE — Progress Notes (Deleted)
Site area: Right groin a 6 french arterial sheath was removed  Site Prior to Removal:  Level 0  Pressure Applied For 30 MINUTES     Bedrest Beginning at 1200p  Manual:   Yes.    Patient Status During Pull:  stable  Post Pull Groin Site:  Level 0  Post Pull Instructions Given:  Yes.    Post Pull Pulses Present:  Yes.    Dressing Applied:  Yes.    Comments:  VS remain stable 

## 2017-07-17 NOTE — Progress Notes (Signed)
RN attempted to start a second PIV but was unsuccessful.  The IV team was notified.

## 2017-07-17 NOTE — Interval H&P Note (Signed)
History and Physical Interval Note:  07/17/2017 2:36 PM  Sharon Yu  has presented today for surgery, with the diagnosis of systoic heart failure  The various methods of treatment have been discussed with the patient and family. After consideration of risks, benefits and other options for treatment, the patient has consented to  Procedure(s): RIGHT/LEFT HEART CATH AND CORONARY ANGIOGRAPHY (N/A) as a surgical intervention .  The patient's history has been reviewed, patient examined, no change in status, stable for surgery.  I have reviewed the patient's chart and labs.  Questions were answered to the patient's satisfaction.   Cath Lab Visit (complete for each Cath Lab visit)  Clinical Evaluation Leading to the Procedure:   ACS: No.  Non-ACS:    Anginal Classification: CCS II  Anti-ischemic medical therapy: Minimal Therapy (1 class of medications)  Non-Invasive Test Results: No non-invasive testing performed  Prior CABG: No previous CABG       Theron Arista [facility] 07/17/2017 2:36 PM

## 2017-07-17 NOTE — Progress Notes (Signed)
Progress Note  Patient Name: Sharon FellingMary Ann Yu Date of Encounter: 07/17/2017  Primary Cardiologist: Dr. Rennis GoldenHilty ([place])  Subjective   No complaints. Feels good today. Dyspnea resolved. Currently CP free. No additional questions regarding cath procedure today.   Inpatient Medications    Scheduled Meds: . aspirin  81 mg Oral Daily  . atorvastatin  40 mg Oral q1800  . enoxaparin (LOVENOX) injection  40 mg Subcutaneous Q24H  . furosemide  40 mg Intravenous BID  . metoprolol tartrate  25 mg Oral BID  . multivitamin with minerals  1 tablet Oral Daily  . potassium gluconate  595 mg Oral Daily  . ramipril  10 mg Oral Daily  . sodium chloride flush  3 mL Intravenous Q12H   Continuous Infusions: . sodium chloride    . sodium chloride     PRN Meds: sodium chloride, acetaminophen **OR** acetaminophen, ondansetron **OR** ondansetron (ZOFRAN) IV, sodium chloride flush   Vital Signs    Vitals:   07/16/17 1456 07/16/17 2217 07/17/17 0651 07/17/17 0723  BP: 140/81 131/86 (!) 138/96   Pulse: 80 85 81   Resp: 18 20 18    Temp: 98.1 F (36.7 C) 98 F (36.7 C) 97.9 F (36.6 C)   TempSrc: Oral Oral Oral   SpO2: 100% 97% 100%   Weight:    251 lb 12.8 oz (114.2 kg)  Height:        Intake/Output Summary (Last 24 hours) at 07/17/2017 0807 Last data filed at 07/16/2017 1842 Gross per 24 hour  Intake 480 ml  Output 1800 ml  Net -1320 ml   Filed Weights   07/15/17 0500 07/16/17 0500 07/17/17 0723  Weight: 260 lb 5.8 oz (118.1 kg) 255 lb 15.3 oz (116.1 kg) 251 lb 12.8 oz (114.2 kg)    Telemetry    NSR w/ occasional PVCs - Personally Reviewed  ECG    NSR- Personally Reviewed  Physical Exam   GEN: No acute distress.  Moderately obese  Neck: No JVD Cardiac: RRR, no murmurs, rubs, or gallops.  Respiratory: Clear to auscultation bilaterally. GI: Soft, nontender, non-distended  MS: No edema; No deformity. Neuro:  Nonfocal  Psych: Normal affect   Labs    Chemistry Recent  Labs  Lab 07/14/17 1852 07/15/17 0412 07/16/17 0426 07/17/17 0419  NA 140 141 143 140  K 3.6 3.3* 3.6 3.6  CL 109 108 108 104  CO2 25 25 28 28   GLUCOSE 104* 99 110* 116*  BUN 19 15 17  25*  CREATININE 1.01* 0.94 0.93 1.08*  CALCIUM 8.9 8.6* 8.8* 9.1  PROT 7.6  --   --   --   ALBUMIN 3.9  --   --   --   AST 18  --   --   --   ALT 21  --   --   --   ALKPHOS 77  --   --   --   BILITOT 0.3  --   --   --   GFRNONAA 58* >60 >60 53*  GFRAA >60 >60 >60 >60  ANIONGAP 6 8 7 8      Hematology Recent Labs  Lab 07/14/17 1852 07/15/17 0412 07/16/17 0426  WBC 9.2 7.6 7.8  RBC 4.60 4.41 4.37  HGB 13.3 12.6 12.5  HCT 40.8 39.4 38.8  MCV 88.7 89.3 88.8  MCH 28.9 28.6 28.6  MCHC 32.6 32.0 32.2  RDW 14.1 14.1 14.1  PLT 208 183 188    Cardiac Enzymes Recent Labs  Lab 07/14/17 1852 07/15/17 0412 07/15/17 0856 07/15/17 1511  TROPONINI 0.05* 0.04* 0.04* 0.04*   No results for input(s): TROPIPOC in the last 168 hours.   BNP Recent Labs  Lab 07/14/17 1852  BNP 330.5*     DDimer  Recent Labs  Lab 07/14/17 1852  DDIMER 0.71*     Radiology    No results found.  Cardiac Studies   2D Echo 07/15/17 Study Conclusions  - Left ventricle: The cavity size was moderately dilated. Systolic function was severely reduced. The estimated ejection fraction was in the range of 25% to 30%. Features are consistent with a pseudonormal left ventricular filling pattern, with concomitant abnormal relaxation and increased filling pressure (grade 2 diastolic dysfunction). Doppler parameters are consistent with high ventricular filling pressure. - Aortic valve: Transvalvular velocity was within the normal range. There was no stenosis. There was mild regurgitation. - Mitral valve: Transvalvular velocity was within the normal range. There was no evidence for stenosis. There was trivial regurgitation. - Left atrium: The atrium was mildly dilated. - Right ventricle: The  cavity size was normal. Wall thickness was normal. Systolic function was normal. - Tricuspid valve: There was no regurgitation. - Pulmonary arteries: Systolic pressure was within the normal range. PA peak pressure: 34 mm Hg (S). - Pericardium, extracardiac: A trivial pericardial effusion was identified.    Patient Profile     Sharon Browder Pearsonis a 66 y.o.femalewith past medical history of HTN, HLD, DM and arthritis, admitted for acute CHF.   Found to have new cardiomyopathy, EF 25-30%.   Assessment & Plan    1. Acute Combined Systolic and Diastolic CHF: EF 91-47% w/ G2DD noted on echo. Breathing improved with diuresis. -1.8 L out yesterday. Appears euvolemic on exam. RHC measurements today will help to determine if she will need further diuresis w/ IV lasix.   2. Cardiomyopathy: newly diagnosed. EF 25-30%. Plan for LHC today to r/o underlying CAD to see if etiology is ischemic vs nonischemic. Plan for guidelines directed medical therapy for systolic HF. Continue BB and ACE-I. Change BB from Lopressor to either long acting Toprol XL vs Coreg. Will also later try to add spironolactone, hydralazine and nitrate if BP permits.   3. Elevated Troponin: flat low level trend c/w demand ischemia in the setting of acute CHF. However plan for LHC today to r/o CAD given new cardiomyopathy.   4. HTN: controlled. Continue to monitor.   5. DM: Type 2. Has been diet controlled. Last Hgb A1c was 7. Repeat lab pending. She is on an ACE-I. Statin also added.  6. HLD: LDL 03/2017 at [facility] was 127 mg/dL. Hepatic function normal. Lipitor 40 mg added. Recheck FLP and HFTs in 6 weeks.   7. Aneurysmal dilatation of the ascending thoracic aorta: incidental finding on chest CT, measuring up to 4.3 cm. Recommend annual imaging followup by CTA or MRA. Continue on BB therapy and keep BP well controlled.   8. Renal Insuffiencey: slight bump in SCr/BUN with diuresis. 1.08/25 today. Plan for LHC today w/  contrast dye. Monitor renal function post cath.   7. LHC: transfer to [facility] later today for procedure. Keep NPO.   I have reviewed the risks, indications, and alternatives to cardiac catheterization and possible angioplasty/stenting with the patient. Risks include but are not limited to bleeding, infection, vascular injury, stroke, myocardial infection, arrhythmia, kidney injury, radiation-related injury in the case of prolonged fluoroscopy use, emergency cardiac surgery, and death. The patient understands the risks of serious complication is  low (<1%).    For questions or updates, please contact [facility] Please consult [email or web address] for contact info under Cardiology/STEMI.      Signed, Robbie Lis, PA-C  07/17/2017, 8:07 AM

## 2017-07-18 ENCOUNTER — Encounter (HOSPITAL_COMMUNITY): Payer: Self-pay | Admitting: Cardiology

## 2017-07-18 DIAGNOSIS — E1165 Type 2 diabetes mellitus with hyperglycemia: Secondary | ICD-10-CM

## 2017-07-18 DIAGNOSIS — E119 Type 2 diabetes mellitus without complications: Secondary | ICD-10-CM

## 2017-07-18 LAB — BASIC METABOLIC PANEL
ANION GAP: 11 (ref 5–15)
BUN: 21 mg/dL — ABNORMAL HIGH (ref 6–20)
CHLORIDE: 103 mmol/L (ref 101–111)
CO2: 26 mmol/L (ref 22–32)
Calcium: 9.4 mg/dL (ref 8.9–10.3)
Creatinine, Ser: 1.12 mg/dL — ABNORMAL HIGH (ref 0.44–1.00)
GFR calc non Af Amer: 51 mL/min — ABNORMAL LOW (ref >60)
GFR, EST AFRICAN AMERICAN: 59 mL/min — AB (ref >60)
Glucose, Bld: 167 mg/dL — ABNORMAL HIGH (ref 65–99)
POTASSIUM: 3.5 mmol/L (ref 3.5–5.1)
SODIUM: 140 mmol/L (ref 135–145)

## 2017-07-18 MED ORDER — CARVEDILOL 3.125 MG PO TABS
3.1250 mg | ORAL_TABLET | Freq: Two times a day (BID) | ORAL | Status: DC
  Administered 2017-07-18: 3.125 mg via ORAL
  Filled 2017-07-18: qty 1

## 2017-07-18 MED ORDER — ATORVASTATIN CALCIUM 40 MG PO TABS: 40.0000 mg | ORAL_TABLET | Freq: Every day | ORAL | 1 refills | Status: DC

## 2017-07-18 MED ORDER — METFORMIN HCL 500 MG PO TABS: 500.0000 mg | ORAL_TABLET | Freq: Two times a day (BID) | ORAL | 1 refills | Status: DC

## 2017-07-18 MED ORDER — LOSARTAN POTASSIUM 25 MG PO TABS
25.0000 mg | ORAL_TABLET | Freq: Every day | ORAL | Status: DC
  Administered 2017-07-18: 25 mg via ORAL
  Filled 2017-07-18: qty 1

## 2017-07-18 MED ORDER — LOSARTAN POTASSIUM 25 MG PO TABS: 25.0000 mg | ORAL_TABLET | Freq: Every day | ORAL | 1 refills | Status: DC

## 2017-07-18 MED ORDER — CARVEDILOL 3.125 MG PO TABS: 3.1250 mg | ORAL_TABLET | Freq: Two times a day (BID) | ORAL | 1 refills | Status: DC

## 2017-07-18 MED ORDER — FUROSEMIDE 40 MG PO TABS: 40.0000 mg | ORAL_TABLET | Freq: Every day | ORAL | 1 refills | Status: DC

## 2017-07-18 MED ORDER — POTASSIUM GLUCONATE 595 (99 K) MG PO TABS: 595.0000 mg | ORAL_TABLET | Freq: Every day | ORAL | 1 refills | Status: DC

## 2017-07-18 NOTE — Care Management Note (Signed)
Case Management Note  Patient Details  Name: Sharon Yu MRN: [id number] Date of Birth: [birth year]  Subjective/Objective:                    Action/Plan:d/c home.   Expected Discharge Date:  07/18/17               Expected Discharge Plan:  Home/Self Care  In-House Referral:     Discharge planning Services  CM Consult  Post Acute Care Choice:    Choice offered to:     DME Arranged:    DME Agency:     HH Arranged:    HH Agency:     Status of Service:  Completed, signed off  If discussed at [employer] of Stay Meetings, dates discussed:    Additional Comments:  Lanier Clam, RN 07/18/2017, 1:33 PM

## 2017-07-18 NOTE — Discharge Instructions (Addendum)

## 2017-07-18 NOTE — Discharge Summary (Signed)
Physician Discharge Summary  Sharon Yu ZOX:[id number] DOB: 11/30/1950 DOA: 07/14/2017  PCP: Pediatrics, [facility]  Admit date: 07/14/2017 Discharge date: 07/18/2017  Time spent: 35 minutes  Recommendations for Outpatient Follow-up:  1. Repeat BMET to follow electrolytes and renal function  2. Please follow BP/volume status and further adjust diuretic regimen 3. Re-assess BP and if needed adjust antihypertensive regimen 4. Close follow up of patient CBG's and A1C; please adjust hypoglycemic regimen as needed.  Discharge Diagnoses:    Acute combined CHF (HCC)   Hypertension   Elevated troponin   CHF (congestive heart failure) (HCC)   Ascending aortic aneurysm (HCC)   Type 2 diabetes mellitus without complication, without long-term current use of insulin (HCC)   Morbid obesity (HCC)   Discharge Condition: stable and improved. Patient discharge home with instructions to follow up with PCP in 10 days and with cardiology service as instructed.  Diet recommendation: heart healthy diet, modified carbohydrates   Filed Weights   07/15/17 0500 07/16/17 0500 07/17/17 0723  Weight: 118.1 kg (260 lb 5.8 oz) 116.1 kg (255 lb 15.3 oz) 114.2 kg (251 lb 12.8 oz)    History of present illness:  66 y.o.femalewithhistory of hypertension presents to the ER with increasing shortness of breath over the last 2 weeks. Patient states she has been getting chest tightness with shortness of breath on exertion over the last 2 weeks denies any associated productive cough fever or chills. Shortness of breath also worsens on lying flat.  Troponin mildly elevated, elevated BNP and vascular congestion seen on CXR. Admitted for further work up of acute heart failure.  Hospital Course:  1-acute combined systolic and diastolic HF -Cr bump up with IV diuresis. Will recommend follow up of renal function with BMET at follow up visit. -continue b-blocker (now switched to coreg), started on  losartan and discharge on daily lasix. -EF 25-30% -follow daily weights and low sodium diet -cardiology will follow her up as an outpatient.  2-HTN -stable and well controlled -discharge on lasix, losartan and coreg -heart healthy diet recommended  -reassess BP and adjust antihypertensive regimen as needed   3-morbid obesity -Body mass index is 39.44 kg/m. -low calorie diet and exercise reccommended  4-elevated troponin -appears to be secondary to demand ischemia from CHF. -appreciate cardiology inputs and recommendations -cardiac cath with normal coronary artery -will continue risk factor modification and medication management   5-HLD -continue lipitor at discharge -repeat lipid panel and LFT's in 6-8 weeks  6-type 2 DM -last A1C 7.0 -was following only diet control prior to admission  -will plan to discharge on metformin -close follow up with PCP for further adjustment on hypoglycemic regimen   Procedures:  See below for  X-ray report   2-D echo: 25-30% EF, grade 2 diastolic dysfunction.   Heart cath: with normal coronary artery; see cardiac cath report for full details.  Consultations:  Cardiology   Discharge Exam: Vitals:   07/18/17 0435 07/18/17 1100  BP: 135/78 125/83  Pulse: 79 (!) 102  Resp: 16 16  Temp: 97.9 F (36.6 C) 97.6 F (36.4 C)  SpO2: 94% 99%     General:no fever, no CP and reports breathing is back to her baseline. patient seen laying flat on bed and in no distress.   Cardiovascular: S1 and S2, no rubs, no gallops, no JVD aprpeciated  Respiratory: improved air movement, no wheezing, no frank crackles.  Abdomen: soft, obese, NT, ND, positive BS.  Musculoskeletal: trace edema bilaterally, no wheezing,  no clubbing     Discharge Instructions   Discharge Instructions    (HEART FAILURE PATIENTS) Call MD:  Anytime you have any of the following symptoms: 1) 3 pound weight gain in 24 hours or 5 pounds in 1 week 2) shortness of  breath, with or without a dry hacking cough 3) swelling in the hands, feet or stomach 4) if you have to sleep on extra pillows at night in order to breathe.   Complete by:  As directed    Diet - low sodium heart healthy   Complete by:  As directed    Discharge instructions   Complete by:  As directed    Follow low sodium diet (2000mg  daily) Maintain adequate hydration Take medications as prescribed Follow low carbohydrates and low calorie diet Arrange follow up with PCP in 10 days     Current Discharge Medication List    START taking these medications   Details  atorvastatin (LIPITOR) 40 MG tablet Take 1 tablet (40 mg total) daily at 6 PM by mouth. Qty: 30 tablet, Refills: 1    carvedilol (COREG) 3.125 MG tablet Take 1 tablet (3.125 mg total) 2 (two) times daily with a meal by mouth. Qty: 60 tablet, Refills: 1    furosemide (LASIX) 40 MG tablet Take 1 tablet (40 mg total) daily by mouth. Qty: 30 tablet, Refills: 1    losartan (COZAAR) 25 MG tablet Take 1 tablet (25 mg total) daily by mouth. Qty: 30 tablet, Refills: 1    metFORMIN (GLUCOPHAGE) 500 MG tablet Take 1 tablet (500 mg total) 2 (two) times daily with a meal by mouth. Qty: 60 tablet, Refills: 1    potassium gluconate 595 (99 K) MG TABS tablet Take 1 tablet (595 mg total) daily by mouth. Qty: 30 tablet, Refills: 1      CONTINUE these medications which have NOT CHANGED   Details  alendronate (FOSAMAX) 70 MG tablet Take 70 mg by mouth every 7 (seven) days. Saturdays. Take with a full glass of water on an empty stomach.    aspirin 81 MG chewable tablet Chew 81 mg daily by mouth.    Multiple Vitamin (MULITIVITAMIN WITH MINERALS) TABS Take 1 tablet by mouth daily.    vitamin E (VITAMIN E) 400 UNIT capsule Take 400 Units by mouth daily.      STOP taking these medications     metoprolol (LOPRESSOR) 50 MG tablet      Potassium 99 MG TABS      ramipril (ALTACE) 10 MG tablet      triamterene-hydrochlorothiazide  (MAXZIDE-25) 37.5-25 MG per tablet        Allergies  Allergen Reactions  . Other Hives and Swelling    "Grapple" (hybrid apple & grape)   Follow-up Information    Ellsworth LennoxStrader, Brittany M, PA-C Follow up on 07/31/2017.   Specialties:  Physician Assistant, Cardiology Why:  Cardiology Follow-Up on 07/31/2017 at 3:00PM.   Contact information: [address] [place] [address] [phone]        [facility]. Schedule an appointment as soon as possible for a visit in 10 day(s).   Specialty:  Pediatrics Contact information: [address] [place] [address] [phone]           The results of significant diagnostics from this hospitalization (including imaging, microbiology, ancillary and laboratory) are listed below for reference.    Significant Diagnostic Studies: Dg Chest 2 View  Result Date: 07/14/2017 CLINICAL  DATA:  66 year old female with shortness of breath. EXAM: CHEST  2 VIEW COMPARISON:  Earlier chest radiograph dated 07/14/2017 FINDINGS: There is no focal consolidation, pleural effusion, or pneumothorax. Top-normal cardiac size. No vascular congestion or edema there is degenerative changes of the shoulders hand AC joints. No acute osseous pathology. IMPRESSION: No active cardiopulmonary disease. Electronically Signed   By: Elgie Collard M.D.   On: 07/14/2017 18:39   Ct Angio Chest Pe W And/or Wo Contrast  Result Date: 07/14/2017 CLINICAL DATA:  Shortness of breath for 2 weeks. EXAM: CT ANGIOGRAPHY CHEST WITH CONTRAST TECHNIQUE: Multidetector CT imaging of the chest was performed using the standard protocol during bolus administration of intravenous contrast. Multiplanar CT image reconstructions and MIPs were obtained to evaluate the vascular anatomy. CONTRAST:  86mL ISOVUE-370 IOPAMIDOL (ISOVUE-370) INJECTION 76% COMPARISON:  Chest x-ray from same day. FINDINGS: Cardiovascular: Satisfactory opacification of the  pulmonary arteries to the segmental level. No evidence of pulmonary embolism. Normal heart size. No pericardial effusion. Mild aneurysmal dilatation of the ascending thoracic aorta, measuring up to 4.3 cm. Mediastinum/Nodes: No enlarged mediastinal, hilar, or axillary lymph nodes. Thyroid gland, trachea, and esophagus demonstrate no significant findings. Lungs/Pleura: There is a 3 mm pulmonary nodule in the right upper lobe (series 5, image 44). Mild bibasilar atelectasis. Trace bilateral pleural effusions. No focal consolidation or pneumothorax. Upper Abdomen:  No acute abnormality.  Prior cholecystectomy. Musculoskeletal: No chest wall abnormality. No acute or significant osseous findings. Degenerative changes of the thoracic spine with evidence of DISH is. Review of the MIP images confirms the above findings. IMPRESSION: 1. No evidence of pulmonary embolism. 2. Trace bilateral pleural effusions. 3. Mild aneurysmal dilatation of the ascending thoracic aorta, measuring up to 4.3 cm. Recommend annual imaging followup by CTA or MRA. This recommendation follows 2010 ACCF/AHA/AATS/ACR/ASA/SCA/SCAI/SIR/STS/SVM Guidelines for the Diagnosis and Management of Patients with Thoracic Aortic Disease. Circulation. 2010; 121: e266-e369 4. 3 mm pulmonary nodule in the right upper lobe. No follow-up needed if patient is low-risk. Non-contrast chest CT can be considered in 12 months if patient is high-risk. This recommendation follows the consensus statement: Guidelines for Management of Incidental Pulmonary Nodules Detected on CT Images: From the Fleischner Society 2017; Radiology 2017; 284:228-243. Electronically Signed   By: Obie Dredge M.D.   On: 07/14/2017 20:47   Labs: Basic Metabolic Panel: Recent Labs  Lab 07/14/17 1852 07/15/17 0412 07/16/17 0426 07/17/17 0419 07/18/17 0943  NA 140 141 143 140 140  K 3.6 3.3* 3.6 3.6 3.5  CL 109 108 108 104 103  CO2 25 25 28 28 26   GLUCOSE 104* 99 110* 116* 167*  BUN  19 15 17  25* 21*  CREATININE 1.01* 0.94 0.93 1.08* 1.12*  CALCIUM 8.9 8.6* 8.8* 9.1 9.4  MG  --  1.8  --   --   --    Liver Function Tests: Recent Labs  Lab 07/14/17 1852  AST 18  ALT 21  ALKPHOS 77  BILITOT 0.3  PROT 7.6  ALBUMIN 3.9   CBC: Recent Labs  Lab 07/14/17 1852 07/15/17 0412 07/16/17 0426  WBC 9.2 7.6 7.8  NEUTROABS 5.3  --   --   HGB 13.3 12.6 12.5  HCT 40.8 39.4 38.8  MCV 88.7 89.3 88.8  PLT 208 183 188   Cardiac Enzymes: Recent Labs  Lab 07/14/17 1852 07/15/17 0412 07/15/17 0856 07/15/17 1511  TROPONINI 0.05* 0.04* 0.04* 0.04*   BNP: BNP (last 3 results) Recent Labs    07/14/17 1852  BNP 330.5*    Signed:  Vassie Loll MD.  [facility] 07/18/2017, 1:32 PM

## 2017-07-18 NOTE — Care Management Important Message (Signed)
Important Message  Patient Details  Name: Sharon Yu MRN: [id number] Date of Birth: Jan 30, 1951   Medicare Important Message Given:  Yes    Caren Macadam 07/18/2017, 12:18 PMImportant Message  Patient Details  Name: Sharon Yu MRN: [id number] Date of Birth: 1951-07-12   Medicare Important Message Given:  Yes    Caren Macadam 07/18/2017, 12:18 PM

## 2017-07-18 NOTE — Progress Notes (Signed)
Progress Note  Patient Name: Sharon Yu Date of Encounter: 07/18/2017  Primary Cardiologist: Dr. Rennis GoldenHilty  Subjective   Breathing improved and at baseline. No recurrent orthopnea. She denies any chest pain or palpitations. No complications concerning her radial cath site.   Inpatient Medications    Scheduled Meds: . aspirin  81 mg Oral Daily  . atorvastatin  40 mg Oral q1800  . furosemide  40 mg Oral Daily  . heparin  5,000 Units Subcutaneous Q8H  . metoprolol tartrate  25 mg Oral BID  . multivitamin with minerals  1 tablet Oral Daily  . potassium gluconate  595 mg Oral Daily  . ramipril  10 mg Oral Daily  . sodium chloride flush  3 mL Intravenous Q12H   Continuous Infusions: . sodium chloride     PRN Meds: sodium chloride, acetaminophen **OR** acetaminophen, ondansetron **OR** ondansetron (ZOFRAN) IV, sodium chloride flush   Vital Signs    Vitals:   07/17/17 1755 07/17/17 2126 07/18/17 0235 07/18/17 0435  BP: (!) 166/93 135/74 139/77 135/78  Pulse: (!) 109 96 83 79  Resp: 20 18 17 16   Temp:  98.3 F (36.8 C) 98.2 F (36.8 C) 97.9 F (36.6 C)  TempSrc:  Oral Oral Oral  SpO2: 99% 98% 98% 94%  Weight:      Height:        Intake/Output Summary (Last 24 hours) at 07/18/2017 0916 Last data filed at 07/17/2017 1400 Gross per 24 hour  Intake 295 ml  Output -  Net 295 ml   Filed Weights   07/15/17 0500 07/16/17 0500 07/17/17 0723  Weight: 260 lb 5.8 oz (118.1 kg) 255 lb 15.3 oz (116.1 kg) 251 lb 12.8 oz (114.2 kg)    Telemetry    NSR, HR in 70's - 90's.  - Personally Reviewed  ECG    No new tracings.   Physical Exam   General: Well developed, well nourished African American female appearing in no acute distress. Head: Normocephalic, atraumatic.  Neck: Supple without bruits, JVD not elevated. Lungs:  Resp regular and unlabored, CTA without wheezing or rales. Heart: RRR, S1, S2, no S3, S4, or murmur; no rub. Abdomen: Soft, non-tender,  non-distended with normoactive bowel sounds. No hepatomegaly. No rebound/guarding. No obvious abdominal masses. Extremities: No clubbing, cyanosis, or edema. Distal pedal pulses are 2+ bilaterally. Right radial cath site without ecchymosis or evidence of a hematoma.  Neuro: Alert and oriented X 3. Moves all extremities spontaneously. Psych: Normal affect.  Labs    Chemistry Recent Labs  Lab 07/14/17 1852 07/15/17 0412 07/16/17 0426 07/17/17 0419  NA 140 141 143 140  K 3.6 3.3* 3.6 3.6  CL 109 108 108 104  CO2 25 25 28 28   GLUCOSE 104* 99 110* 116*  BUN 19 15 17  25*  CREATININE 1.01* 0.94 0.93 1.08*  CALCIUM 8.9 8.6* 8.8* 9.1  PROT 7.6  --   --   --   ALBUMIN 3.9  --   --   --   AST 18  --   --   --   ALT 21  --   --   --   ALKPHOS 77  --   --   --   BILITOT 0.3  --   --   --   GFRNONAA 58* >60 >60 53*  GFRAA >60 >60 >60 >60  ANIONGAP 6 8 7 8      Hematology Recent Labs  Lab 07/14/17 1852 07/15/17 0412 07/16/17 0426  WBC 9.2 7.6 7.8  RBC 4.60 4.41 4.37  HGB 13.3 12.6 12.5  HCT 40.8 39.4 38.8  MCV 88.7 89.3 88.8  MCH 28.9 28.6 28.6  MCHC 32.6 32.0 32.2  RDW 14.1 14.1 14.1  PLT 208 183 188    Cardiac Enzymes Recent Labs  Lab 07/14/17 1852 07/15/17 0412 07/15/17 0856 07/15/17 1511  TROPONINI 0.05* 0.04* 0.04* 0.04*   No results for input(s): TROPIPOC in the last 168 hours.   BNP Recent Labs  Lab 07/14/17 1852  BNP 330.5*     DDimer  Recent Labs  Lab 07/14/17 1852  DDIMER 0.71*     Radiology    No results found.  Cardiac Studies   Echocardiogram: 07/15/2017 Study Conclusions  - Left ventricle: The cavity size was moderately dilated. Systolic   function was severely reduced. The estimated ejection fraction   was in the range of 25% to 30%. Features are consistent with a   pseudonormal left ventricular filling pattern, with concomitant   abnormal relaxation and increased filling pressure (grade 2   diastolic dysfunction). Doppler  parameters are consistent with   high ventricular filling pressure. - Aortic valve: Transvalvular velocity was within the normal range.   There was no stenosis. There was mild regurgitation. - Mitral valve: Transvalvular velocity was within the normal range.   There was no evidence for stenosis. There was trivial   regurgitation. - Left atrium: The atrium was mildly dilated. - Right ventricle: The cavity size was normal. Wall thickness was   normal. Systolic function was normal. - Tricuspid valve: There was no regurgitation. - Pulmonary arteries: Systolic pressure was within the normal   range. PA peak pressure: 34 mm Hg (S). - Pericardium, extracardiac: A trivial pericardial effusion was   identified.  Cardiac Catheterization: 07/17/2017  There is severe left ventricular systolic dysfunction.  LV end diastolic pressure is normal.  The left ventricular ejection fraction is 25-35% by visual estimate.  LV end diastolic pressure is normal.   1. Normal coronary anatomy 2. Severe LV dysfuntion. EF 30% 3. Normal LV filling pressures 4. Normal right heart pressures 5. Preserved cardiac output.  Plan: medical therapy. Will switch IV lasix to po.  Patient Profile     66 y.o. female with PMH of HTN, HLD, and arthritis who was admitted for worsening dyspnea on exertion and orthopnea, found to have a reduced EF of 25-30%.   Assessment & Plan    1. Acute Combined Systolic and Diastolic CHF - presented with worsening dyspnea on exertion and orthopnea, found to have a reduced EF of 25-30% w/ G2DD noted on echo. Breathing improved with diuresis with an overall net output of -1.8 L out this admission. R/LHC was performed on 11/15 and showed normal cors with normal right heat pressures.Continued medical therapy was recommended and she was switched to PO Lasix 40mg  daily.  - she remains on Lopressor 25mg  BID and Ramipril 10mg  daily. Will switch Lopressor to Coreg 3.125mg  BIDand Ramipril to  Losartan 25mg  daily with her cardiomyopathy. Consider switching to [facility] as an outpatient if renal function and BP allow.   2. Nonischemic Cardiomyopathy - newly diagnosed. EF 25-30%. Cath showed normal cors.  - she has been on Lopressor 25mg  BID. Will switch to Coreg 3.125mg  BID in the setting of her cardiomyopathy and this can be further titrated as an outpatient. Switch Ramipril to Losartan with possible transition to [facility] as an outpatient.   3. Elevated Troponin - flat low level trend c/w demand  ischemia in the setting of acute CHF. Cath showed normal cors.  4. HTN - well-controlled at 100/71 - 166/99 within the past 24 hours. Continue to follow on an outpatient basis in the setting of medication adjustments as above.   5. Type 2 DM - per admitting team.   6. HLD - LDL 03/2017 at [facility] was 127 mg/dL. Hepatic function normal. Lipitor 40 mg added. Recheck FLP and HFTs in 6 weeks.   7. Aneurysmal dilatation of the ascending thoracic aorta - incidental finding on chest CT,measuring up to 4.3 cm. Recommend annual imaging followup by CTA or MRA.  8. Renal Insuffiencey - baseline creatinine 0.8 - 0.9. At 1.08 on 07/17/2017 in the setting of receiving IV diuresis. Will recheck creatinine today following cath. Has been switched to PO diuretics.     For questions or updates, please contact [facility] Please consult [email or web address] for contact info under Cardiology/STEMI.   Signed, Ellsworth Lennox , PA-C 9:16 AM 07/18/2017 Pager: [phone]

## 2017-07-28 DIAGNOSIS — I5040 Unspecified combined systolic (congestive) and diastolic (congestive) heart failure: Secondary | ICD-10-CM | POA: Insufficient documentation

## 2017-07-29 NOTE — Progress Notes (Signed)
Cardiology Office Note    Date:  07/31/2017   ID:  Sharon Yu, DOB Mar 18, 1951, MRN [id number]  PCP:  Pediatrics, [facility]  Cardiologist: Dr. Rennis GoldenHilty   Chief Complaint  Patient presents with  . Hospitalization Follow-up    History of Present Illness:    Sharon Yu is a 66 y.o. female with past medical history of AAA (4.3 cm by imaging in 07/2017), HTN, HLD, and arthritis who presents to the office today for hospital follow-up.   She was recently admitted to [facility] from 11/12 - 07/18/2017 for evaluation of worsening dyspnea on exertion. BNP was elevated to 330 and she was started on IV Lasix for diuresis. Echocardiogram showed a newly reduced EF of 25-30% with Grade 2 DD. A R/LHC was performed on 11/15 and showed normal cors with normal right heart pressures, most consistent with a nonischemic cardiomyopathy. She was started on Coreg 3.125mg  BID, Losartan 25mg  daily, and Lasix 40mg  daily.   In talking with the patient today, she reports overall doing well since her recent hospitalization. Denies any chest pain, dyspnea on exertion, orthopnea, PND, lower extremity edema or palpitations.   She has made significant lifestyle changes and has done her best to eliminate salt from her diet. She has been weighing daily and reports this has been stable between 250 - 252 lbs on her home scales.   She has been compliant with her medications. Her PCP recently switched Losartan to Benicar in the setting of the recall but the patient was going to verify with her pharmacy if her Rx was part of the recall prior to switching medications.    Past Medical History:  Diagnosis Date  . Arthritis   . Back pain    buldging disc  . Hyperlipidemia    takes Crestor daily  . Hypertension    takes Maxzide and Metoprolol daily along with Ramipril  . Hypopotassemia    takes Potassium pill daily  . Joint pain   . Joint swelling   . Nonischemic cardiomyopathy (HCC)    a.  07/2017: EF reduced to 25-30%, cath showing normal cors.   . Shortness of breath    with exertion    Past Surgical History:  Procedure Laterality Date  . bilateral knee arthroscopies    . carpel tunnel     bilateral  . CHOLECYSTECTOMY    . colonscopy    . JOINT REPLACEMENT Left   . RIGHT/LEFT HEART CATH AND CORONARY ANGIOGRAPHY N/A 07/17/2017   Procedure: RIGHT/LEFT HEART CATH AND CORONARY ANGIOGRAPHY;  Surgeon: SwazilandJordan, Peter M, MD;  [facility];  Service: Cardiovascular;  Laterality: N/A;  . TOTAL KNEE ARTHROPLASTY Right 02/19/2013   Procedure: TOTAL KNEE ARTHROPLASTY;  Surgeon: Thera FlakeW D Caffrey Jr., MD;  Location: [facility] OR;  Service: Orthopedics;  Laterality: Right;  right total knee arthroplasty  . TOTAL KNEE REVISION Left 07/02/2013   Procedure: LEFT TOTAL KNEE TIBIA REVISION;  Surgeon: Nestor LewandowskyFrank J Rowan, MD;  Location: [facility] OR;  Service: Orthopedics;  Laterality: Left;    Current Medications: Outpatient Medications Prior to Visit  Medication Sig Dispense Refill  . alendronate (FOSAMAX) 70 MG tablet Take 70 mg by mouth every 7 (seven) days. Saturdays. Take with a full glass of water on an empty stomach.    Marland Kitchen. aspirin 81 MG chewable tablet Chew 81 mg daily by mouth.    . Multiple Vitamin (MULITIVITAMIN WITH MINERALS) TABS Take 1 tablet by mouth daily.    .Marland Kitchen  olmesartan (BENICAR) 20 MG tablet Take 20 mg by mouth daily.    . potassium gluconate 595 (99 K) MG TABS tablet Take 1 tablet (595 mg total) daily by mouth. 30 tablet 1  . vitamin E (VITAMIN E) 400 UNIT capsule Take 400 Units by mouth daily.    Marland Kitchen atorvastatin (LIPITOR) 40 MG tablet Take 1 tablet (40 mg total) daily at 6 PM by mouth. 30 tablet 1  . carvedilol (COREG) 3.125 MG tablet Take 1 tablet (3.125 mg total) 2 (two) times daily with a meal by mouth. 60 tablet 1  . furosemide (LASIX) 40 MG tablet Take 1 tablet (40 mg total) daily by mouth. 30 tablet 1  . metFORMIN (GLUCOPHAGE) 500 MG tablet Take 1 tablet (500 mg total) 2  (two) times daily with a meal by mouth. (Patient taking differently: Take 500 mg by mouth daily with breakfast. ) 60 tablet 1  . losartan (COZAAR) 25 MG tablet Take 1 tablet (25 mg total) daily by mouth. (Patient not taking: Reported on 07/31/2017) 30 tablet 1   No facility-administered medications prior to visit.      Allergies:   Other   Social History   Socioeconomic History  . Marital status: Married    Spouse name: None  . Number of children: None  . Years of education: None  . Highest education level: None  Social Needs  . Financial resource strain: None  . Food insecurity - worry: None  . Food insecurity - inability: None  . Transportation needs - medical: None  . Transportation needs - non-medical: None  Occupational History  . None  Tobacco Use  . Smoking status: Never Smoker  . Smokeless tobacco: Never Used  Substance and Sexual Activity  . Alcohol use: No  . Drug use: No  . Sexual activity: Yes    Birth control/protection: Post-menopausal  Other Topics Concern  . None  Social History Narrative  . None     Family History:  The patient's family history includes CAD in her sister; Heart failure in her sister; Hypertension in her mother.   Review of Systems:   Please see the history of present illness.     General:  No chills, fever, night sweats or weight changes.  Cardiovascular:  No chest pain, edema, orthopnea, palpitations, paroxysmal nocturnal dyspnea. Positive for dyspnea on exertion (now improved).  Dermatological: No rash, lesions/masses Respiratory: No cough, dyspnea Urologic: No hematuria, dysuria Abdominal:   No nausea, vomiting, diarrhea, bright red blood per rectum, melena, or hematemesis Neurologic:  No visual changes, wkns, changes in mental status. All other systems reviewed and are otherwise negative except as noted above.   Physical Exam:    VS:  BP 133/84   Pulse 100   Ht 5\' 5"  (1.651 m)   Wt 252 lb (114.3 kg)   BMI 41.93 kg/m      General: Well developed, well nourished [place] American female appearing in no acute distress. Head: Normocephalic, atraumatic, sclera non-icteric, no xanthomas, nares are without discharge.  Neck: No carotid bruits. JVD not elevated.  Lungs: Respirations regular and unlabored, without wheezes or rales.  Heart: Regular rate and rhythm. No S3 or S4.  No murmur, no rubs, or gallops appreciated. Abdomen: Soft, non-tender, non-distended with normoactive bowel sounds. No hepatomegaly. No rebound/guarding. No obvious abdominal masses. Msk:  Strength and tone appear normal for age. No joint deformities or effusions. Extremities: No clubbing or cyanosis. No lower extremity edema.  Distal pedal pulses are 2+ bilaterally.  Neuro: Alert and oriented X 3. Moves all extremities spontaneously. No focal deficits noted. Psych:  Responds to questions appropriately with a normal affect. Skin: No rashes or lesions noted  Wt Readings from Last 3 Encounters:  07/31/17 252 lb (114.3 kg)  07/17/17 251 lb 12.8 oz (114.2 kg)  07/05/13 252 lb 13.9 oz (114.7 kg)     Studies/Labs Reviewed:   EKG:  EKG is not ordered today.   Recent Labs: 07/14/2017: ALT 21; B Natriuretic Peptide 330.5 07/15/2017: Magnesium 1.8; TSH 2.867 07/16/2017: Hemoglobin 12.5; Platelets 188 07/18/2017: BUN 21; Creatinine, Ser 1.12; Potassium 3.5; Sodium 140   Lipid Panel No results found for: CHOL, TRIG, HDL, CHOLHDL, VLDL, LDLCALC, LDLDIRECT  Additional studies/ records that were reviewed today include:   Echocardiogram: 07/15/2017 Study Conclusions  - Left ventricle: The cavity size was moderately dilated. Systolic   function was severely reduced. The estimated ejection fraction   was in the range of 25% to 30%. Features are consistent with a   pseudonormal left ventricular filling pattern, with concomitant   abnormal relaxation and increased filling pressure (grade 2   diastolic dysfunction). Doppler parameters are  consistent with   high ventricular filling pressure. - Aortic valve: Transvalvular velocity was within the normal range.   There was no stenosis. There was mild regurgitation. - Mitral valve: Transvalvular velocity was within the normal range.   There was no evidence for stenosis. There was trivial   regurgitation. - Left atrium: The atrium was mildly dilated. - Right ventricle: The cavity size was normal. Wall thickness was   normal. Systolic function was normal. - Tricuspid valve: There was no regurgitation. - Pulmonary arteries: Systolic pressure was within the normal   range. PA peak pressure: 34 mm Hg (S). - Pericardium, extracardiac: A trivial pericardial effusion was   identified.  Cardiac Catheterization: 07/17/2017  There is severe left ventricular systolic dysfunction.  LV end diastolic pressure is normal.  The left ventricular ejection fraction is 25-35% by visual estimate.  LV end diastolic pressure is normal.   1. Normal coronary anatomy 2. Severe LV dysfuntion. EF 30% 3. Normal LV filling pressures 4. Normal right heart pressures 5. Preserved cardiac output.  Plan: medical therapy. Will switch IV lasix to po.    Assessment:    1. Chronic combined systolic and diastolic CHF (congestive heart failure) (HCC)   2. Nonischemic cardiomyopathy (HCC)   3. Essential hypertension   4. Hyperlipidemia LDL goal <100   5. Ascending aortic aneurysm (HCC)      Plan:   In order of problems listed above:  1. Chronic Combined Systolic and Diastolic CHF/ Nonischemic Cardiomyopathy - the patient was recently hospitalized for evaluation of worsening dyspnea on exertion, found to have a newly reduced EF of 25-30% with Grade 2 DD and cath showing normal cors  - weight has remained stable on her home scales. Continue Lasix 40mg  daily. Kidney function and electrolytes checked by her PCP earlier this week and stable at that time.  - continue ARB. Will further titrate Coreg  from 3.125mg  BID to 6.25mg  BID. Will arrange for close follow-up within the next 4 weeks for further titration of her medications. Consider switching to [facility] if BP allows. Will plan for a repeat echocardiogram in 3 months to reassess. If EF remains significantly reduced despite medication titration, will need to consider referral to EP for possible ICD placement.   2. HTN - BP is well-controlled at 133/84 during today's visit. Initially tachycardiac into the low-100's  but improving to the 80's on recheck.   - continue ARB (possibly switching from Losartan to Benicar as outlined above) and BB therapy. Will titrate Coreg to 6.25mg  BID in the setting of her cardiomyopathy.   3. HLD - followed by PCP.  - remains on Atorvastatin 40mg  daily.   4. AAA - 4.3 cm by imaging earlier this month.  - continue to follow on an annual basis.   Medication Adjustments/Labs and Tests Ordered: Current medicines are reviewed at length with the patient today.  Concerns regarding medicines are outlined above.  Medication changes, Labs and Tests ordered today are listed in the Patient Instructions below. Patient Instructions  Medication Instructions:  INCREASE Coreg 6.25 mg take 1 tablet twice a day   Labwork: None   Testing/Procedures: None   Follow-Up: Your physician recommends that you schedule a follow-up appointment in: 4-6 weeks with Dr Rennis Golden.  Any Other Special Instructions Will Be Listed Below (If Applicable).  If you need a refill on your cardiac medications before your next appointment, please call your pharmacy.   Signed, Ellsworth Lennox, PA-C  07/31/2017 7:31 PM    [facility] [address] [place], [address] [place], [place]  [address] Phone: [phone]; Fax: [phone]  [address] [place], [place] [address] Phone: [phone]

## 2017-07-31 ENCOUNTER — Encounter: Payer: Self-pay | Admitting: Student

## 2017-07-31 ENCOUNTER — Ambulatory Visit (INDEPENDENT_AMBULATORY_CARE_PROVIDER_SITE_OTHER): Payer: Medicare Other | Admitting: Student

## 2017-07-31 VITALS — BP 133/84 | HR 100 | Ht 65.0 in | Wt 252.0 lb

## 2017-07-31 DIAGNOSIS — I5042 Chronic combined systolic (congestive) and diastolic (congestive) heart failure: Secondary | ICD-10-CM

## 2017-07-31 DIAGNOSIS — E785 Hyperlipidemia, unspecified: Secondary | ICD-10-CM | POA: Diagnosis not present

## 2017-07-31 DIAGNOSIS — I428 Other cardiomyopathies: Secondary | ICD-10-CM

## 2017-07-31 DIAGNOSIS — I10 Essential (primary) hypertension: Secondary | ICD-10-CM | POA: Diagnosis not present

## 2017-07-31 DIAGNOSIS — I7121 Aneurysm of the ascending aorta, without rupture: Secondary | ICD-10-CM

## 2017-07-31 DIAGNOSIS — I712 Thoracic aortic aneurysm, without rupture: Secondary | ICD-10-CM

## 2017-07-31 MED ORDER — CARVEDILOL 6.25 MG PO TABS: 6.2500 mg | ORAL_TABLET | Freq: Two times a day (BID) | ORAL | 3 refills | Status: DC

## 2017-07-31 MED ORDER — ATORVASTATIN CALCIUM 40 MG PO TABS: 40.0000 mg | ORAL_TABLET | Freq: Every day | ORAL | 6 refills | Status: DC

## 2017-07-31 MED ORDER — METFORMIN HCL 500 MG PO TABS
500.0000 mg | ORAL_TABLET | Freq: Every day | ORAL | 1 refills | Status: DC
Start: 2017-07-31 — End: 2018-11-26

## 2017-07-31 MED ORDER — FUROSEMIDE 40 MG PO TABS: 40.0000 mg | ORAL_TABLET | Freq: Every day | ORAL | 1 refills | Status: DC

## 2017-07-31 NOTE — Patient Instructions (Addendum)
Medication Instructions:  INCREASE Coreg 6.25 mg take 1 tablet twice a day   Labwork: None   Testing/Procedures: None   Follow-Up: Your physician recommends that you schedule a follow-up appointment in: 4-6 weeks with Dr Rennis Golden.  Any Other Special Instructions Will Be Listed Below (If Applicable).  If you need a refill on your cardiac medications before your next appointment, please call your pharmacy.

## 2017-08-18 ENCOUNTER — Telehealth: Payer: Self-pay | Admitting: Internal Medicine

## 2017-08-18 NOTE — Telephone Encounter (Signed)
Left detailed message for jennifer with echo, cath and bp results.

## 2017-08-18 NOTE — Telephone Encounter (Signed)
New message   Victorino Dike is calling from [facility] and she is calling for her last Echo and her last BP reading

## 2017-09-12 ENCOUNTER — Ambulatory Visit: Payer: Medicare Other | Admitting: Internal Medicine

## 2017-09-12 ENCOUNTER — Encounter: Payer: Self-pay | Admitting: Internal Medicine

## 2017-09-12 VITALS — BP 124/78 | HR 90 | Resp 16 | Ht 66.0 in | Wt 254.0 lb

## 2017-09-12 DIAGNOSIS — I5042 Chronic combined systolic (congestive) and diastolic (congestive) heart failure: Secondary | ICD-10-CM | POA: Diagnosis not present

## 2017-09-12 DIAGNOSIS — I428 Other cardiomyopathies: Secondary | ICD-10-CM | POA: Diagnosis not present

## 2017-09-12 DIAGNOSIS — I10 Essential (primary) hypertension: Secondary | ICD-10-CM | POA: Diagnosis not present

## 2017-09-12 DIAGNOSIS — E785 Hyperlipidemia, unspecified: Secondary | ICD-10-CM | POA: Diagnosis not present

## 2017-09-12 DIAGNOSIS — I712 Thoracic aortic aneurysm, without rupture: Secondary | ICD-10-CM

## 2017-09-12 DIAGNOSIS — I7121 Aneurysm of the ascending aorta, without rupture: Secondary | ICD-10-CM

## 2017-09-12 NOTE — Progress Notes (Signed)
OFFICE NOTE  Chief Complaint:  Follow-up heart failure  Primary Care Physician: System, Pcp Not In  HPI:  Sharon Yu is a 67 y.o. female with a past medial history significant for hypertension, dyslipidemia, recent hospitalization for heart failure.  She was found to have a nonischemic cardiomyopathy with normal coronary arteries on cardiac catheterization in November 2018.  LVEF on echo was 25-30%.  She was started on carvedilol and losartan.  After discharge blood pressure remained elevated and her losartan was switched to olmesartan.  Carvedilol was then increased.  She reports she is doing well on the medications.  Initially blood pressure was elevated today however repeat came down to 124/78.  Weight is within a few pounds where it had been previously.  She denies any new or worsening edema.  She denies any chest pain.  She reports that she is going to start to exercise more with silver sneakers.  She attends the [employer] in [facility].  She was also found to have a 4.3 cm ascending aortic aneurysm.  This will need to be reimaged in approximately 1 year.  PMHx:  Past Medical History:  Diagnosis Date  . Arthritis   . Back pain    buldging disc  . Hyperlipidemia    takes Crestor daily  . Hypertension    takes Maxzide and Metoprolol daily along with Ramipril  . Hypopotassemia    takes Potassium pill daily  . Joint pain   . Joint swelling   . Nonischemic cardiomyopathy (HCC)    a. 07/2017: EF reduced to 25-30%, cath showing normal cors.   . Shortness of breath    with exertion    Past Surgical History:  Procedure Laterality Date  . bilateral knee arthroscopies    . carpel tunnel     bilateral  . CHOLECYSTECTOMY    . colonscopy    . JOINT REPLACEMENT Left   . RIGHT/LEFT HEART CATH AND CORONARY ANGIOGRAPHY N/A 07/17/2017   Procedure: RIGHT/LEFT HEART CATH AND CORONARY ANGIOGRAPHY;  Surgeon: Swaziland, Peter M, MD;  Location: [facility] INVASIVE CV LAB;  Service: Cardiovascular;   Laterality: N/A;  . TOTAL KNEE ARTHROPLASTY Right 02/19/2013   Procedure: TOTAL KNEE ARTHROPLASTY;  Surgeon: Thera Flake., MD;  Location: [facility] OR;  Service: Orthopedics;  Laterality: Right;  right total knee arthroplasty  . TOTAL KNEE REVISION Left 07/02/2013   Procedure: LEFT TOTAL KNEE TIBIA REVISION;  Surgeon: Nestor Lewandowsky, MD;  Location: [facility] OR;  Service: Orthopedics;  Laterality: Left;    FAMHx:  Family History  Problem Relation Age of Onset  . Hypertension Mother   . CAD Sister   . Heart failure Sister     SOCHx:   reports that  has never smoked. she has never used smokeless tobacco. She reports that she does not drink alcohol or use drugs.  ALLERGIES:  Allergies  Allergen Reactions  . Other Hives and Swelling    "Grapple" (hybrid apple & grape)    ROS: Pertinent items noted in HPI and remainder of comprehensive ROS otherwise negative.  HOME MEDS: Current Outpatient Medications on File Prior to Visit  Medication Sig Dispense Refill  . alendronate (FOSAMAX) 70 MG tablet Take 70 mg by mouth every 7 (seven) days. Saturdays. Take with a full glass of water on an empty stomach.    Marland Kitchen aspirin 81 MG chewable tablet Chew 81 mg daily by mouth.    Marland Kitchen atorvastatin (LIPITOR) 40 MG tablet Take 1 tablet (40 mg total)  by mouth daily at 6 PM. 30 tablet 6  . carvedilol (COREG) 6.25 MG tablet Take 1 tablet (6.25 mg total) by mouth 2 (two) times daily with a meal. 60 tablet 3  . furosemide (LASIX) 40 MG tablet Take 1 tablet (40 mg total) by mouth daily. 30 tablet 1  . metFORMIN (GLUCOPHAGE) 500 MG tablet Take 1 tablet (500 mg total) by mouth daily with breakfast. 60 tablet 1  . Multiple Vitamin (MULITIVITAMIN WITH MINERALS) TABS Take 1 tablet by mouth daily.    Marland Kitchen olmesartan (BENICAR) 20 MG tablet Take 20 mg by mouth daily.    . potassium gluconate 595 (99 K) MG TABS tablet Take 1 tablet (595 mg total) daily by mouth. 30 tablet 1  . vitamin E (VITAMIN E) 400 UNIT capsule Take 400 Units by  mouth daily.     No current facility-administered medications on file prior to visit.     LABS/IMAGING: No results found for this or any previous visit (from the past 48 hour(s)). No results found.  LIPID PANEL: No results found for: CHOL, TRIG, HDL, CHOLHDL, VLDL, LDLCALC, LDLDIRECT   WEIGHTS: Wt Readings from Last 3 Encounters:  09/12/17 254 lb (115.2 kg)  07/31/17 252 lb (114.3 kg)  07/17/17 251 lb 12.8 oz (114.2 kg)    VITALS: BP 124/78   Pulse 90   Resp 16   Ht 5\' 6"  (1.676 m)   Wt 254 lb (115.2 kg)   SpO2 99%   BMI 41.00 kg/m   EXAM: General appearance: alert, no distress and morbidly obese Neck: no carotid bruit, no JVD and thyroid not enlarged, symmetric, no tenderness/mass/nodules Lungs: clear to auscultation bilaterally Heart: regular rate and rhythm, S1, S2 normal, no murmur, click, rub or gallop Abdomen: soft, non-tender; bowel sounds normal; no masses,  no organomegaly Extremities: extremities normal, atraumatic, no cyanosis or edema Pulses: 2+ and symmetric Skin: Skin color, texture, turgor normal. No rashes or lesions Neurologic: Grossly normal Psych: Pleasant  EKG: Deferred  ASSESSMENT: 1. Chronic systolic congestive heart failure-NYHA class II symptoms 2. Nonischemic cardiomyopathy, normal coronary arteries by heart catheterization 07/2017 3. Hypertension 4. Dyslipidemia 5. Morbid obesity 6. 4.3 cm ascending aortic aneurysm  PLAN: 1.   Sharon Yu has had better blood pressure control after readjustment of her medications recently by Randall An, PA-C.  Her LVEF was 25-30% in November and I suspect will improve over time on this new regimen of heart failure medicines.  Plan a repeat echo in 6 months and I will see her back at that time.  If LVEF has not improved significantly, would consider switching her to [facility].  Chrystie Nose, MD, [facility], FACP  [place]  [facility]  Medical Director of the Advanced Lipid Disorders &    Cardiovascular Risk Reduction Clinic Diplomate of the American Board of Clinical Lipidology Attending Cardiologist  Direct Dial: [phone]  Fax: [phone]  Website:  [email or web address] Nicely Lamerle Jabs 09/12/2017, 1:47 PM

## 2017-09-12 NOTE — Patient Instructions (Addendum)
Your physician has requested that you have an echocardiogram @ [address] - [address]rd [address]. Echocardiography is a painless test that uses sound waves to create images of your heart. It provides your doctor with information about the size and shape of your heart and how well your heart's chambers and valves are working. This procedure takes approximately one hour. There are no restrictions for this procedure.  Your physician wants you to follow-up in: 6 months with Dr. Rennis Golden after echocardiogram. You will receive a reminder letter in the mail two months in advance. If you don't receive a letter, please call our office to schedule the follow-up appointment.

## 2018-03-12 ENCOUNTER — Other Ambulatory Visit: Payer: Self-pay

## 2018-03-12 ENCOUNTER — Ambulatory Visit (HOSPITAL_COMMUNITY): Payer: Medicare Other | Attending: Cardiology

## 2018-03-12 DIAGNOSIS — I428 Other cardiomyopathies: Secondary | ICD-10-CM | POA: Insufficient documentation

## 2018-03-12 DIAGNOSIS — I119 Hypertensive heart disease without heart failure: Secondary | ICD-10-CM | POA: Insufficient documentation

## 2018-03-12 DIAGNOSIS — E785 Hyperlipidemia, unspecified: Secondary | ICD-10-CM | POA: Diagnosis not present

## 2018-03-12 DIAGNOSIS — I77810 Thoracic aortic ectasia: Secondary | ICD-10-CM | POA: Insufficient documentation

## 2018-03-12 DIAGNOSIS — I351 Nonrheumatic aortic (valve) insufficiency: Secondary | ICD-10-CM | POA: Insufficient documentation

## 2018-03-12 LAB — ECHOCARDIOGRAM COMPLETE

## 2018-03-12 MED ORDER — PERFLUTREN LIPID MICROSPHERE
1.0000 mL | INTRAVENOUS | Status: AC | PRN
  Administered 2018-03-12: 1 mL via INTRAVENOUS

## 2018-04-08 ENCOUNTER — Ambulatory Visit: Payer: Medicare Other | Admitting: Internal Medicine

## 2018-04-08 ENCOUNTER — Encounter: Payer: Self-pay | Admitting: Internal Medicine

## 2018-04-08 VITALS — BP 132/84 | HR 90 | Ht 66.0 in | Wt 251.6 lb

## 2018-04-08 DIAGNOSIS — I428 Other cardiomyopathies: Secondary | ICD-10-CM

## 2018-04-08 DIAGNOSIS — I5042 Chronic combined systolic (congestive) and diastolic (congestive) heart failure: Secondary | ICD-10-CM | POA: Diagnosis not present

## 2018-04-08 DIAGNOSIS — I7121 Aneurysm of the ascending aorta, without rupture: Secondary | ICD-10-CM

## 2018-04-08 DIAGNOSIS — I712 Thoracic aortic aneurysm, without rupture: Secondary | ICD-10-CM

## 2018-04-08 DIAGNOSIS — I10 Essential (primary) hypertension: Secondary | ICD-10-CM | POA: Diagnosis not present

## 2018-04-08 MED ORDER — SACUBITRIL-VALSARTAN 49-51 MG PO TABS: 1.0000 | ORAL_TABLET | Freq: Two times a day (BID) | ORAL | 5 refills | Status: DC

## 2018-04-08 NOTE — Patient Instructions (Addendum)
Medication Instructions:   STOP losartan START entresto 49-51mg  twice daily (30 day voucher provided)  Labwork:  NONE  Testing/Procedures:  NONE  Follow-Up:  Your physician recommends that you schedule a follow-up appointment in: TWO MONTHS with Dr. Rennis Golden   If you need a refill on your cardiac medications before your next appointment, please call your pharmacy.  Any Other Special Instructions Will Be Listed Below (If Applicable).

## 2018-04-08 NOTE — Progress Notes (Signed)
OFFICE NOTE  Chief Complaint:  Routine follow-up  Primary Care Physician: Dorthula Matas, PA-C  HPI:  Sharon Yu is a 67 y.o. female with a past medial history significant for hypertension, dyslipidemia, recent hospitalization for heart failure.  She was found to have a nonischemic cardiomyopathy with normal coronary arteries on cardiac catheterization in November 2018.  LVEF on echo was 25-30%.  She was started on carvedilol and losartan.  After discharge blood pressure remained elevated and her losartan was switched to olmesartan.  Carvedilol was then increased.  She reports she is doing well on the medications.  Initially blood pressure was elevated today however repeat came down to 124/78.  Weight is within a few pounds where it had been previously.  She denies any new or worsening edema.  She denies any chest pain.  She reports that she is going to start to exercise more with silver sneakers.  She attends the [employer] in [facility].  She was also found to have a 4.3 cm ascending aortic aneurysm.  This will need to be reimaged in approximately 1 year.  04/08/2018  Sharon Yu returns today for heart failure follow-up.  We recently performed a repeat echocardiogram, she indicates improvement in LVEF from 25 to 30% up to 40 to 45%.  She is tolerating the addition of carvedilol and is on losartan.  She also takes low-dose aspirin, atorvastatin and furosemide 40 mg daily.  She would does report symptomatic improvement and currently endorses NYHA class II symptoms.  Echo shows stability of her 4.3 cm ascending aortic aneurysm and we will recommend following this annually.  We may consider a CT scan of the aorta during the next study to more fully visualize the aortic arch and proximal descending aorta.  PMHx:  Past Medical History:  Diagnosis Date  . Arthritis   . Back pain    buldging disc  . Hyperlipidemia    takes Crestor daily  . Hypertension    takes Maxzide and Metoprolol daily along  with Ramipril  . Hypopotassemia    takes Potassium pill daily  . Joint pain   . Joint swelling   . Nonischemic cardiomyopathy (HCC)    a. 07/2017: EF reduced to 25-30%, cath showing normal cors.   . Shortness of breath    with exertion    Past Surgical History:  Procedure Laterality Date  . bilateral knee arthroscopies    . carpel tunnel     bilateral  . CHOLECYSTECTOMY    . colonscopy    . JOINT REPLACEMENT Left   . RIGHT/LEFT HEART CATH AND CORONARY ANGIOGRAPHY N/A 07/17/2017   Procedure: RIGHT/LEFT HEART CATH AND CORONARY ANGIOGRAPHY;  Surgeon: Swaziland, Peter M, MD;  Location: [facility] INVASIVE CV LAB;  Service: Cardiovascular;  Laterality: N/A;  . TOTAL KNEE ARTHROPLASTY Right 02/19/2013   Procedure: TOTAL KNEE ARTHROPLASTY;  Surgeon: Thera Flake., MD;  Location: [facility] OR;  Service: Orthopedics;  Laterality: Right;  right total knee arthroplasty  . TOTAL KNEE REVISION Left 07/02/2013   Procedure: LEFT TOTAL KNEE TIBIA REVISION;  Surgeon: Nestor Lewandowsky, MD;  Location: [facility] OR;  Service: Orthopedics;  Laterality: Left;    FAMHx:  Family History  Problem Relation Age of Onset  . Hypertension Mother   . CAD Sister   . Heart failure Sister     SOCHx:   reports that she has never smoked. She has never used smokeless tobacco. She reports that she does not drink alcohol or use  drugs.  ALLERGIES:  Allergies  Allergen Reactions  . Other Hives and Swelling    "Grapple" (hybrid apple & grape)    ROS: Pertinent items noted in HPI and remainder of comprehensive ROS otherwise negative.  HOME MEDS: Current Outpatient Medications on File Prior to Visit  Medication Sig Dispense Refill  . alendronate (FOSAMAX) 70 MG tablet Take 70 mg by mouth every 7 (seven) days. Saturdays. Take with a full glass of water on an empty stomach.    Marland Kitchen aspirin 81 MG chewable tablet Chew 81 mg daily by mouth.    Marland Kitchen atorvastatin (LIPITOR) 40 MG tablet Take 1 tablet (40 mg total) by mouth daily at 6 PM. 30 tablet  6  . carvedilol (COREG) 6.25 MG tablet Take 1 tablet (6.25 mg total) by mouth 2 (two) times daily with a meal. 60 tablet 3  . furosemide (LASIX) 40 MG tablet Take 1 tablet (40 mg total) by mouth daily. 30 tablet 1  . losartan (COZAAR) 50 MG tablet Take 50 mg by mouth daily.    . metFORMIN (GLUCOPHAGE) 500 MG tablet Take 1 tablet (500 mg total) by mouth daily with breakfast. 60 tablet 1  . Multiple Vitamin (MULITIVITAMIN WITH MINERALS) TABS Take 1 tablet by mouth daily.    . potassium gluconate 595 (99 K) MG TABS tablet Take 1 tablet (595 mg total) daily by mouth. 30 tablet 1  . vitamin E (VITAMIN E) 400 UNIT capsule Take 400 Units by mouth daily.     No current facility-administered medications on file prior to visit.     LABS/IMAGING: No results found for this or any previous visit (from the past 48 hour(s)). No results found.  LIPID PANEL: No results found for: CHOL, TRIG, HDL, CHOLHDL, VLDL, LDLCALC, LDLDIRECT   WEIGHTS: Wt Readings from Last 3 Encounters:  04/08/18 251 lb 9.6 oz (114.1 kg)  09/12/17 254 lb (115.2 kg)  07/31/17 252 lb (114.3 kg)    VITALS: BP 132/84   Pulse 90   Ht 5\' 6"  (1.676 m)   Wt 251 lb 9.6 oz (114.1 kg)   BMI 40.61 kg/m   EXAM: General appearance: alert, no distress and morbidly obese Neck: no carotid bruit, no JVD and thyroid not enlarged, symmetric, no tenderness/mass/nodules Lungs: clear to auscultation bilaterally Heart: regular rate and rhythm, S1, S2 normal, no murmur, click, rub or gallop Abdomen: soft, non-tender; bowel sounds normal; no masses,  no organomegaly Extremities: extremities normal, atraumatic, no cyanosis or edema Pulses: 2+ and symmetric Skin: Skin color, texture, turgor normal. No rashes or lesions Neurologic: Grossly normal Psych: Pleasant  EKG: Sinus rhythm at 90, biatrial enlargement-personally reviewed  ASSESSMENT: 1. Chronic systolic congestive heart failure-NYHA class II symptoms 2. Nonischemic cardiomyopathy,  normal coronary arteries by heart catheterization 07/2017 3. Hypertension 4. Dyslipidemia 5. Morbid obesity 6. 4.3 cm ascending aortic aneurysm  PLAN: 1.   Sharon Yu has had an excellent improvement in LVEF however has not normalized.  She had normal coronaries therefore has nonischemic cardiomyopathy.  Blood pressure is been well controlled.  I do think she benefit from additional adjustments in her medications.  I would like to switch her losartan over to Entresto 49/51 mg daily.  Cost may be an issue for her.  Unfortunately her husband recently died unexpectedly.  She still trying to adjust to this both emotionally as well as financially.  I encouraged her to apply for patient assistance.  We will continue to monitor closely.  Plan repeat echo in 6  to 12 months and CT scan of the ascending aorta and a year.  Follow-up in 3 months to titrate her heart failure medications.  Chrystie Nose, MD, [facility], FACP  [place]  [facility]  Medical Director of the [facility]  [facility] Diplomate of the American Board of Clinical Lipidology Attending Cardiologist  Direct Dial: [phone]  Fax: [phone]  Website:  [email or web address]   Sharon Yu 04/08/2018, 2:12 PM

## 2018-04-30 ENCOUNTER — Telehealth: Payer: Self-pay | Admitting: Internal Medicine

## 2018-04-30 NOTE — Telephone Encounter (Signed)
New Message:    Jennifer(RN0 with [employer] is calling to report the patient has a weight gain of over 5lbs.   Pt states she does not have any other systems.

## 2018-04-30 NOTE — Telephone Encounter (Signed)
Returned call to Keo, RN @ [employer] [phone] ext [phone].  Called pt to f/u with her. Left voicemail for her to call the office in the morning.

## 2018-05-01 NOTE — Telephone Encounter (Signed)
Left message to call back - on both phones- cell , home

## 2018-05-01 NOTE — Telephone Encounter (Signed)
Spoke with patient, she states weight has been 1 to 2 lbs up  Or down. today's wgt 251.8lb , yesterday 252.8lbs. She states her appetite is back and her dry weight is @ 250 lbs now. RN informed patient to continue monitoring and if she gains more than 3 lbs over night may take additional furosemide that day.  Patient verbalized understanding.

## 2018-05-29 ENCOUNTER — Emergency Department (HOSPITAL_BASED_OUTPATIENT_CLINIC_OR_DEPARTMENT_OTHER)
Admission: EM | Admit: 2018-05-29 | Discharge: 2018-05-29 | Disposition: A | Discharge status: Home / Self Care | Payer: Medicare Other | Attending: Emergency Medicine | Admitting: Emergency Medicine

## 2018-05-29 ENCOUNTER — Other Ambulatory Visit: Payer: Self-pay

## 2018-05-29 ENCOUNTER — Encounter (HOSPITAL_BASED_OUTPATIENT_CLINIC_OR_DEPARTMENT_OTHER): Payer: Self-pay | Admitting: Emergency Medicine

## 2018-05-29 DIAGNOSIS — Z7984 Long term (current) use of oral hypoglycemic drugs: Secondary | ICD-10-CM | POA: Diagnosis not present

## 2018-05-29 DIAGNOSIS — Z7982 Long term (current) use of aspirin: Secondary | ICD-10-CM | POA: Insufficient documentation

## 2018-05-29 DIAGNOSIS — I5042 Chronic combined systolic (congestive) and diastolic (congestive) heart failure: Secondary | ICD-10-CM | POA: Diagnosis not present

## 2018-05-29 DIAGNOSIS — E119 Type 2 diabetes mellitus without complications: Secondary | ICD-10-CM | POA: Diagnosis not present

## 2018-05-29 DIAGNOSIS — I110 Hypertensive heart disease with heart failure: Secondary | ICD-10-CM | POA: Insufficient documentation

## 2018-05-29 DIAGNOSIS — Z96652 Presence of left artificial knee joint: Secondary | ICD-10-CM | POA: Insufficient documentation

## 2018-05-29 DIAGNOSIS — Z96651 Presence of right artificial knee joint: Secondary | ICD-10-CM | POA: Insufficient documentation

## 2018-05-29 DIAGNOSIS — Z79899 Other long term (current) drug therapy: Secondary | ICD-10-CM | POA: Insufficient documentation

## 2018-05-29 DIAGNOSIS — M545 Low back pain: Secondary | ICD-10-CM | POA: Diagnosis not present

## 2018-05-29 HISTORY — DX: Other chronic pain: G89.29

## 2018-05-29 HISTORY — DX: Dorsalgia, unspecified: M54.9

## 2018-05-29 LAB — URINALYSIS, ROUTINE W REFLEX MICROSCOPIC
Bilirubin Urine: NEGATIVE
Glucose, UA: NEGATIVE mg/dL
Ketones, ur: NEGATIVE mg/dL
Leukocytes, UA: NEGATIVE
Nitrite: NEGATIVE
Protein, ur: 30 mg/dL — AB
Specific Gravity, Urine: 1.025 (ref 1.005–1.030)
pH: 6 (ref 5.0–8.0)

## 2018-05-29 LAB — URINALYSIS, MICROSCOPIC (REFLEX): WBC, UA: NONE SEEN WBC/hpf (ref 0–5)

## 2018-05-29 MED ORDER — KETOROLAC TROMETHAMINE 30 MG/ML IJ SOLN
15.0000 mg | Freq: Once | INTRAMUSCULAR | Status: AC
  Administered 2018-05-29: 15 mg via INTRAMUSCULAR
  Filled 2018-05-29: qty 1

## 2018-05-29 MED ORDER — METHOCARBAMOL 500 MG PO TABS
500.0000 mg | ORAL_TABLET | Freq: Once | ORAL | Status: AC
  Administered 2018-05-29: 500 mg via ORAL
  Filled 2018-05-29: qty 1

## 2018-05-29 MED ORDER — METHOCARBAMOL 500 MG PO TABS: 500.0000 mg | ORAL_TABLET | Freq: Two times a day (BID) | ORAL | 0 refills | Status: DC

## 2018-05-29 NOTE — ED Triage Notes (Signed)
Lower back pain, worse than usual since last night.  No dysuria.  No fever. No known injury.

## 2018-05-29 NOTE — Discharge Instructions (Signed)
Continue taking your meloxicam and gabapentin as prescribed.  Take Robaxin twice daily as needed for muscle pain or spasms.  Do not drive or operate machinery while taking this medication.  Use heat and ice alternating 20 minutes on, 20 minutes off.  Attempt the exercises and stretches as tolerated a few times daily.  Please follow-up with your orthopedic doctor if your symptoms are not improving.  Please return the emergency department if you develop any new or worsening symptoms.

## 2018-05-29 NOTE — ED Provider Notes (Signed)
[facility] EMERGENCY DEPARTMENT Provider Note   CSN: [id number] Arrival date & time: 05/29/18  1720     History   Chief Complaint Chief Complaint  Patient presents with  . Back Pain    HPI Sharon Yu is a 67 y.o. female with history of back pain who presents with worsening left-sided back pain with radiation of pain to her left buttock.  Patient reports seeing her orthopedic doctor 3 days ago who prescribed meloxicam and gabapentin.  Medication has been helping minimally, but got worse over the past 2 days.  She reports pain whenever she moves in a spasm in her left back radiating to her buttock.  She denies any numbness or tingling in her legs, saddle anesthesia, loss of bowel or bladder control, urinary symptoms, fever, recent procedure to back, history of IVDU.  Patient reports receiving injections to her back 2 years ago.  Patient denies any recent injury or heavy lifting.  HPI  Past Medical History:  Diagnosis Date  . Arthritis   . Back pain    buldging disc  . Chronic back pain   . Hyperlipidemia    takes Crestor daily  . Hypertension    takes Maxzide and Metoprolol daily along with Ramipril  . Hypopotassemia    takes Potassium pill daily  . Joint pain   . Joint swelling   . Nonischemic cardiomyopathy (HCC)    a. 07/2017: EF reduced to 25-30%, cath showing normal cors.   . Shortness of breath    with exertion    Patient Active Problem List   Diagnosis Date Noted  . Nonischemic cardiomyopathy (HCC) 07/31/2017  . Type 2 diabetes mellitus without complication, without long-term current use of insulin (HCC)   . Morbid obesity (HCC)   . Elevated troponin 07/15/2017  . Chronic combined systolic and diastolic CHF (congestive heart failure) (HCC) 07/15/2017  . Ascending aortic aneurysm (HCC)   . CHF exacerbation (HCC) 07/14/2017  . Acute posthemorrhagic anemia 08/11/2013  . Hypokalemia 07/25/2013  . Essential hypertension 07/25/2013  .  Hyperlipidemia LDL goal <100 07/25/2013  . 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 bilateral knee arthroscopies    . carpel tunnel     bilateral  . CHOLECYSTECTOMY    . colonscopy    . JOINT REPLACEMENT Left   . RIGHT/LEFT HEART CATH AND CORONARY ANGIOGRAPHY N/A 07/17/2017   Procedure: RIGHT/LEFT HEART CATH AND CORONARY ANGIOGRAPHY;  Surgeon: Swaziland, Peter M, MD;  Location: [facility] INVASIVE CV LAB;  Service: Cardiovascular;  Laterality: N/A;  . TOTAL KNEE ARTHROPLASTY Right 02/19/2013   Procedure: TOTAL KNEE ARTHROPLASTY;  Surgeon: Thera Flake., MD;  Location: [facility] OR;  Service: Orthopedics;  Laterality: Right;  right total knee arthroplasty  . TOTAL KNEE REVISION Left 07/02/2013   Procedure: LEFT TOTAL KNEE TIBIA REVISION;  Surgeon: Nestor Lewandowsky, MD;  Location: [facility] OR;  Service: Orthopedics;  Laterality: Left;     OB History   None      Home Medications    Prior to Admission medications   Medication Sig Start Date End Date Taking? Authorizing Provider  alendronate (FOSAMAX) 70 MG tablet Take 70 mg by mouth every 7 (seven) days. Saturdays. Take with a full glass of water on an empty stomach.   Yes [provider]  aspirin 81 MG chewable tablet Chew 81 mg daily by mouth.   Yes [provider]  atorvastatin (LIPITOR) 40 MG tablet Take 1 tablet (40 mg total) by mouth daily at 6 PM. 07/31/17  Yes Strader, [place] M, PA-C  carvedilol (COREG) 6.25 MG tablet Take 1 tablet (6.25 mg total) by mouth 2 (two) times daily with a meal. 07/31/17  Yes Strader, [place] M, PA-C  furosemide (LASIX) 40 MG tablet Take 1 tablet (40 mg total) by mouth daily. 07/31/17  Yes Strader, [place] M, PA-C  metFORMIN (GLUCOPHAGE) 500 MG tablet Take 1 tablet (500 mg total) by mouth daily with breakfast. 07/31/17 07/31/18 Yes Strader, [place] M, PA-C  Multiple Vitamin (MULITIVITAMIN WITH MINERALS) TABS Take  1 tablet by mouth daily.   Yes [provider]  potassium gluconate 595 (99 K) MG TABS tablet Take 1 tablet (595 mg total) daily by mouth. 07/19/17  Yes Vassie Loll, MD  Vitamin D3 (VITAMIN D) 25 MCG tablet Take 1,000 Units by mouth daily.   Yes [provider]  vitamin E (VITAMIN E) 400 UNIT capsule Take 400 Units by mouth daily.   Yes [provider]  methocarbamol (ROBAXIN) 500 MG tablet Take 1 tablet (500 mg total) by mouth 2 (two) times daily. 05/29/18   Yancarlos Berthold, Waylan Boga, PA-C  sacubitril-valsartan (ENTRESTO) 49-51 MG Take 1 tablet by mouth 2 (two) times daily. 04/08/18   Hilty, Lisette Abu, MD    Family History Family History  Problem Relation Age of Onset  . Hypertension Mother   . CAD Sister   . Heart failure Sister     Social History Social History   Tobacco Use  . Smoking status: Never Smoker  . Smokeless tobacco: Never Used  Substance Use Topics  . Alcohol use: No  . Drug use: No     Allergies   Other   Review of Systems Review of Systems  Constitutional: Negative for fever.  Genitourinary: Negative for dysuria and frequency.  Musculoskeletal: Positive for back pain.  Neurological: Negative for numbness.     Physical Exam Updated Vital Signs BP (!) 162/80 (BP Location: Left Arm)   Pulse (!) 101   Temp 97.9 F (36.6 C) (Oral)   Resp 18   Ht 5\' 6"  (1.676 m)   Wt 113.4 kg   SpO2 99%   BMI 40.35 kg/m   Physical Exam  Constitutional: She appears well-developed and well-nourished. No distress.  Obese  HENT:  Head: Normocephalic and atraumatic.  Mouth/Throat: Oropharynx is clear and moist. No oropharyngeal exudate.  Eyes: Pupils are equal, round, and reactive to light. Conjunctivae are normal. Right eye exhibits no discharge. Left eye exhibits no discharge. No scleral icterus.  Neck: Normal range of motion. Neck supple. No thyromegaly present.  Cardiovascular: Normal rate, regular rhythm, normal heart sounds and intact distal  pulses. Exam reveals no gallop and no friction rub.  No murmur heard. Pulmonary/Chest: Effort normal and breath sounds normal. No stridor. No respiratory distress. She has no wheezes. She has no rales.  Abdominal: Soft. Bowel sounds are normal. She exhibits no distension. There is no tenderness. There is no rebound and no guarding.  Musculoskeletal: She exhibits no edema.  No midline cervical, thoracic, or lumbar tenderness No tenderness over the paraspinal muscles or gluteal muscles  Lymphadenopathy:    She has no cervical adenopathy.  Neurological: She is alert. Coordination normal.  5/5 strength to bilateral lower extremities with normal sensation bilaterally  Skin: Skin is warm and dry. No rash noted. She is not diaphoretic. No pallor.  Psychiatric: She has a normal mood and affect.  Nursing note and vitals reviewed.    ED Treatments / Results  Labs (all labs ordered are listed, but only abnormal results are displayed) Labs Reviewed  URINALYSIS, ROUTINE W REFLEX MICROSCOPIC - Abnormal; Notable for the following components:      Result Value   Hgb urine dipstick LARGE (*)    Protein, ur 30 (*)    All other components within normal limits  URINALYSIS, MICROSCOPIC (REFLEX) - Abnormal; Notable for the following components:   Bacteria, UA FEW (*)    All other components within normal limits    EKG None  Radiology No results found.  Procedures Procedures (including critical care time)  Medications Ordered in ED Medications  ketorolac (TORADOL) 30 MG/ML injection 15 mg (15 mg Intramuscular Given 05/29/18 1826)  methocarbamol (ROBAXIN) tablet 500 mg (500 mg Oral Given 05/29/18 1826)     Initial Impression / Assessment and Plan / ED Course  I have reviewed the triage vital signs and the nursing notes.  Pertinent labs & imaging results that were available during my care of the patient were reviewed by me and considered in my medical decision making (see chart for  details).     Patient presenting with acute on chronic low back pain.  Her pain is improved with Toradol and Robaxin in the ED.  Normal neuro exam without focal deficits.  No signs of cauda equina syndrome.  UA is negative for infection.  It does show large hematuria, however patient states she has been worked up for this before and her urologist and blamed her hypertension.  Patient is currently seeing orthopedic doctor for her back pain.  We will add Robaxin to her current regimen of meloxicam and gabapentin.  Recommend ice and heat alternating as well as exercises as tolerated.  Follow-up to orthopedics if symptoms not improving.  MRI is pending, per patient's insurance authorizing it.  Patient understands and agrees with plan.  Return precautions discussed.  Patient understands and agrees with plan.  Patient vitals stable throughout ED course and discharged in satisfactory condition. I discussed patient case with Dr. Clarene Duke who guided the patient's management and agrees with plan.   Final Clinical Impressions(s) / ED Diagnoses   Final diagnoses:  Acute left-sided low back pain, with sciatica presence unspecified    ED Discharge Orders         Ordered    methocarbamol (ROBAXIN) 500 MG tablet  2 times daily     05/29/18 1911           Emi Holes, PA-C 05/29/18 1913    Little, Ambrose Finland, MD 05/30/18 [phone]

## 2018-06-08 ENCOUNTER — Ambulatory Visit: Payer: Medicare Other | Admitting: Internal Medicine

## 2018-06-17 ENCOUNTER — Other Ambulatory Visit: Payer: Self-pay | Admitting: Physical Medicine and Rehabilitation

## 2018-06-17 DIAGNOSIS — M545 Low back pain, unspecified: Secondary | ICD-10-CM

## 2018-06-30 ENCOUNTER — Ambulatory Visit
Admission: RE | Admit: 2018-06-30 | Discharge: 2018-06-30 | Disposition: A | Discharge status: Home / Self Care | Payer: Medicare Other | Source: Ambulatory Visit | Attending: Physical Medicine and Rehabilitation | Admitting: Physical Medicine and Rehabilitation

## 2018-06-30 DIAGNOSIS — M545 Low back pain, unspecified: Secondary | ICD-10-CM

## 2018-07-09 ENCOUNTER — Encounter: Payer: Self-pay | Admitting: Internal Medicine

## 2018-07-09 ENCOUNTER — Ambulatory Visit: Payer: Medicare Other | Admitting: Internal Medicine

## 2018-07-09 VITALS — BP 133/74 | HR 89 | Ht 67.0 in | Wt 248.2 lb

## 2018-07-09 DIAGNOSIS — I5042 Chronic combined systolic (congestive) and diastolic (congestive) heart failure: Secondary | ICD-10-CM

## 2018-07-09 DIAGNOSIS — I712 Thoracic aortic aneurysm, without rupture: Secondary | ICD-10-CM | POA: Diagnosis not present

## 2018-07-09 DIAGNOSIS — I10 Essential (primary) hypertension: Secondary | ICD-10-CM

## 2018-07-09 DIAGNOSIS — I7121 Aneurysm of the ascending aorta, without rupture: Secondary | ICD-10-CM

## 2018-07-09 DIAGNOSIS — I428 Other cardiomyopathies: Secondary | ICD-10-CM

## 2018-07-09 MED ORDER — SACUBITRIL-VALSARTAN 97-103 MG PO TABS: 1.0000 | ORAL_TABLET | Freq: Two times a day (BID) | ORAL | 11 refills | Status: DC

## 2018-07-09 NOTE — Patient Instructions (Signed)
Medication Instructions:  INCREASE entresto to 97/103mg  twice daily (start once you have finished current prescription/dose) If you need a refill on your cardiac medications before your next appointment, please call your pharmacy.   Lab Work: BMET, BNP two weeks after increasing entresto dose -- you can come to Dr. Blanchie Dessert office, no appointment is needed -- M-F 8am-430pm   Follow-Up: At [facility], you and your health needs are our priority.  As part of our continuing mission to provide you with exceptional heart care, we have created designated Provider Care Teams.  These Care Teams include your primary Cardiologist (physician) and Advanced Practice Providers (APPs -  Physician Assistants and Nurse Practitioners) who all work together to provide you with the care you need, when you need it. You will need a follow up appointment in 8 months (after echocardiogram).  Please call our office 2 months in advance to schedule this appointment.  You may see Chrystie Nose, MD or one of the following Advanced Practice Providers on your designated Care Team: [place], [place] . Micah Flesher, PA-C  Any Other Special Instructions Will Be Listed Below (If Applicable).

## 2018-07-09 NOTE — Progress Notes (Signed)
OFFICE NOTE  Chief Complaint:  Follow-up heart failure  Primary Care Physician: Dorthula Matas, PA-C  HPI:  Sharon Yu is a 67 y.o. female with a past medial history significant for hypertension, dyslipidemia, recent hospitalization for heart failure.  She was found to have a nonischemic cardiomyopathy with normal coronary arteries on cardiac catheterization in November 2018.  LVEF on echo was 25-30%.  She was started on carvedilol and losartan.  After discharge blood pressure remained elevated and her losartan was switched to olmesartan.  Carvedilol was then increased.  She reports she is doing well on the medications.  Initially blood pressure was elevated today however repeat came down to 124/78.  Weight is within a few pounds where it had been previously.  She denies any new or worsening edema.  She denies any chest pain.  She reports that she is going to start to exercise more with silver sneakers.  She attends the [employer] in [facility].  She was also found to have a 4.3 cm ascending aortic aneurysm.  This will need to be reimaged in approximately 1 year.  04/08/2018  Sharon Yu returns today for heart failure follow-up.  We recently performed a repeat echocardiogram, she indicates improvement in LVEF from 25 to 30% up to 40 to 45%.  She is tolerating the addition of carvedilol and is on losartan.  She also takes low-dose aspirin, atorvastatin and furosemide 40 mg daily.  She would does report symptomatic improvement and currently endorses NYHA class II symptoms.  Echo shows stability of her 4.3 cm ascending aortic aneurysm and we will recommend following this annually.  We may consider a CT scan of the aorta during the next study to more fully visualize the aortic arch and proximal descending aorta.  07/09/2018  Sharon Yu is seen today for follow-up heart failure.  Recently her LVEF improved from 25 to 30% up to 40 to 45%.  She is on moderate dose Entresto.  She endorses no more than  NYHA class II symptoms.  She was noted to have a 4.3 cm ascending aortic aneurysm which will need routine follow-up.  Her last echo was in July 2019.  She is had about 6 pound weight loss this year.  She is on moderate dose Lasix.  She seems to be tolerating medications and notes her blood pressure at home is around the 120s over low 70s.  I think there is room to increase her Entresto further.  She says it cost her about $80 for a 22-month supply which she said is affordable at this time and she is not receiving patient assistance because she did not qualify financially.  PMHx:  Past Medical History:  Diagnosis Date  . Arthritis   . Back pain    buldging disc  . Chronic back pain   . Hyperlipidemia    takes Crestor daily  . Hypertension    takes Maxzide and Metoprolol daily along with Ramipril  . Hypopotassemia    takes Potassium pill daily  . Joint pain   . Joint swelling   . Nonischemic cardiomyopathy (HCC)    a. 07/2017: EF reduced to 25-30%, cath showing normal cors.   . Shortness of breath    with exertion    Past Surgical History:  Procedure Laterality Date  . bilateral knee arthroscopies    . carpel tunnel     bilateral  . CHOLECYSTECTOMY    . colonscopy    . JOINT REPLACEMENT Left   .  RIGHT/LEFT HEART CATH AND CORONARY ANGIOGRAPHY N/A 07/17/2017   Procedure: RIGHT/LEFT HEART CATH AND CORONARY ANGIOGRAPHY;  Surgeon: Swaziland, Peter M, MD;  Location: [facility] INVASIVE CV LAB;  Service: Cardiovascular;  Laterality: N/A;  . TOTAL KNEE ARTHROPLASTY Right 02/19/2013   Procedure: TOTAL KNEE ARTHROPLASTY;  Surgeon: Thera Flake., MD;  Location: [facility] OR;  Service: Orthopedics;  Laterality: Right;  right total knee arthroplasty  . TOTAL KNEE REVISION Left 07/02/2013   Procedure: LEFT TOTAL KNEE TIBIA REVISION;  Surgeon: Nestor Lewandowsky, MD;  Location: [facility] OR;  Service: Orthopedics;  Laterality: Left;    FAMHx:  Family History  Problem Relation Age of Onset  . Hypertension Mother   . CAD  Sister   . Heart failure Sister     SOCHx:   reports that she has never smoked. She has never used smokeless tobacco. She reports that she does not drink alcohol or use drugs.  ALLERGIES:  Allergies  Allergen Reactions  . Other Hives and Swelling    "Grapple" (hybrid apple & grape)    ROS: Pertinent items noted in HPI and remainder of comprehensive ROS otherwise negative.  HOME MEDS: Current Outpatient Medications on File Prior to Visit  Medication Sig Dispense Refill  . alendronate (FOSAMAX) 70 MG tablet Take 70 mg by mouth every 7 (seven) days. Saturdays. Take with a full glass of water on an empty stomach.    Marland Kitchen aspirin 81 MG chewable tablet Chew 81 mg daily by mouth.    Marland Kitchen atorvastatin (LIPITOR) 40 MG tablet Take 1 tablet (40 mg total) by mouth daily at 6 PM. 30 tablet 6  . carvedilol (COREG) 6.25 MG tablet Take 1 tablet (6.25 mg total) by mouth 2 (two) times daily with a meal. 60 tablet 3  . furosemide (LASIX) 40 MG tablet Take 1 tablet (40 mg total) by mouth daily. 30 tablet 1  . metFORMIN (GLUCOPHAGE) 500 MG tablet Take 1 tablet (500 mg total) by mouth daily with breakfast. 60 tablet 1  . methocarbamol (ROBAXIN) 500 MG tablet Take 1 tablet (500 mg total) by mouth 2 (two) times daily. 20 tablet 0  . Multiple Vitamin (MULITIVITAMIN WITH MINERALS) TABS Take 1 tablet by mouth daily.    . potassium gluconate 595 (99 K) MG TABS tablet Take 1 tablet (595 mg total) daily by mouth. 30 tablet 1  . sacubitril-valsartan (ENTRESTO) 49-51 MG Take 1 tablet by mouth 2 (two) times daily. 60 tablet 5  . Vitamin D3 (VITAMIN D) 25 MCG tablet Take 1,000 Units by mouth daily.    . vitamin E (VITAMIN E) 400 UNIT capsule Take 400 Units by mouth daily.    . RESTASIS 0.05 % ophthalmic emulsion INSTILL 1 DROP INTO EACH EYE TWICE DAILY AS DIRECTED  3   No current facility-administered medications on file prior to visit.     LABS/IMAGING: No results found for this or any previous visit (from the past  48 hour(s)). No results found.  LIPID PANEL: No results found for: CHOL, TRIG, HDL, CHOLHDL, VLDL, LDLCALC, LDLDIRECT   WEIGHTS: Wt Readings from Last 3 Encounters:  07/09/18 248 lb 3.2 oz (112.6 kg)  05/29/18 250 lb (113.4 kg)  04/08/18 251 lb 9.6 oz (114.1 kg)    VITALS: BP 133/74   Pulse 89   Ht 5\' 7"  (1.702 m)   Wt 248 lb 3.2 oz (112.6 kg)   BMI 38.87 kg/m   EXAM: General appearance: alert, no distress and morbidly obese Neck: no carotid  bruit, no JVD and thyroid not enlarged, symmetric, no tenderness/mass/nodules Lungs: clear to auscultation bilaterally Heart: regular rate and rhythm, S1, S2 normal, no murmur, click, rub or gallop Abdomen: soft, non-tender; bowel sounds normal; no masses,  no organomegaly Extremities: extremities normal, atraumatic, no cyanosis or edema Pulses: 2+ and symmetric Skin: Skin color, texture, turgor normal. No rashes or lesions Neurologic: Grossly normal Psych: Pleasant  EKG: Normal sinus rhythm 89, right atrial enlargement-personally reviewed  ASSESSMENT: 1. Chronic systolic congestive heart failure-NYHA class II symptoms 2. Nonischemic cardiomyopathy, normal coronary arteries by heart catheterization 07/2017 3. Hypertension 4. Dyslipidemia 5. Morbid obesity 6. 4.3 cm ascending aortic aneurysm  PLAN: 1.   Sharon Yu is doing well with no more than NYHA class II symptoms.  We will go ahead and further increase her Entresto up to 97/103 mg twice daily.  She will get a repeat metabolic profile and BNP about 2 weeks after that we may need to decrease her Lasix from 40 to 20 mg to accommodate that.  Blood pressure is at goal.  She has lost about 6 pounds this past year, some of which may be fluid and/or not eating as well due to the loss of her husband.  Will need to follow-up on her ascending aortic aneurysm.  We will plan a repeat echo in July of next year and follow-up after that time or sooner as necessary.  Chrystie Nose, MD,  [facility], FACP  [place]  [facility]  Medical Director of the [facility] Lipid [facility] &  Cardiovascular Risk Reduction Clinic Diplomate of the American Board of Clinical Lipidology Attending Cardiologist  Direct Dial: [phone]  Fax: [phone]  Website:  [email or web address] Nicely Aricka Goldberger 07/09/2018, 2:11 PM

## 2018-09-07 ENCOUNTER — Telehealth: Payer: Self-pay | Admitting: *Deleted

## 2018-09-07 NOTE — Telephone Encounter (Signed)
Received Medical records from [facility]; forwarded to provider/SLS 01/06

## 2018-09-29 ENCOUNTER — Ambulatory Visit (HOSPITAL_COMMUNITY): Payer: Medicare Other | Attending: Cardiology

## 2018-09-29 ENCOUNTER — Encounter (INDEPENDENT_AMBULATORY_CARE_PROVIDER_SITE_OTHER): Payer: Self-pay

## 2018-09-29 DIAGNOSIS — I428 Other cardiomyopathies: Secondary | ICD-10-CM | POA: Insufficient documentation

## 2018-09-29 LAB — BASIC METABOLIC PANEL
BUN / CREAT RATIO: 20 (ref 12–28)
BUN: 16 mg/dL (ref 8–27)
CALCIUM: 9 mg/dL (ref 8.7–10.3)
CHLORIDE: 103 mmol/L (ref 96–106)
CO2: 24 mmol/L (ref 20–29)
Creatinine, Ser: 0.79 mg/dL (ref 0.57–1.00)
GFR, EST AFRICAN AMERICAN: 91 mL/min/{1.73_m2} (ref >59)
GFR, EST NON AFRICAN AMERICAN: 79 mL/min/{1.73_m2} (ref >59)
Glucose: 87 mg/dL (ref 65–99)
POTASSIUM: 3.8 mmol/L (ref 3.5–5.2)
Sodium: 144 mmol/L (ref 134–144)

## 2018-09-29 LAB — PRO B NATRIURETIC PEPTIDE: NT-PRO BNP: 35 pg/mL (ref 0–301)

## 2018-09-29 MED ORDER — PERFLUTREN LIPID MICROSPHERE
1.0000 mL | INTRAVENOUS | Status: AC | PRN
  Administered 2018-09-29: 3 mL via INTRAVENOUS

## 2018-10-15 ENCOUNTER — Ambulatory Visit (INDEPENDENT_AMBULATORY_CARE_PROVIDER_SITE_OTHER): Payer: Medicare Other | Admitting: Family Medicine

## 2018-10-15 ENCOUNTER — Encounter: Payer: Self-pay | Admitting: Family Medicine

## 2018-10-15 VITALS — BP 128/77 | HR 76 | Temp 98.8°F | Ht 67.0 in | Wt 247.0 lb

## 2018-10-15 DIAGNOSIS — Z23 Encounter for immunization: Secondary | ICD-10-CM | POA: Diagnosis not present

## 2018-10-15 DIAGNOSIS — Z0000 Encounter for general adult medical examination without abnormal findings: Secondary | ICD-10-CM

## 2018-10-15 DIAGNOSIS — E669 Obesity, unspecified: Secondary | ICD-10-CM

## 2018-10-15 DIAGNOSIS — E1169 Type 2 diabetes mellitus with other specified complication: Secondary | ICD-10-CM | POA: Diagnosis not present

## 2018-10-15 DIAGNOSIS — Z1239 Encounter for other screening for malignant neoplasm of breast: Secondary | ICD-10-CM | POA: Diagnosis not present

## 2018-10-15 DIAGNOSIS — Z1159 Encounter for screening for other viral diseases: Secondary | ICD-10-CM | POA: Diagnosis not present

## 2018-10-15 DIAGNOSIS — E2839 Other primary ovarian failure: Secondary | ICD-10-CM

## 2018-10-15 LAB — HEPATIC FUNCTION PANEL
ALT: 20 U/L (ref 0–35)
AST: 11 U/L (ref 0–37)
Albumin: 4 g/dL (ref 3.5–5.2)
Alkaline Phosphatase: 79 U/L (ref 39–117)
Bilirubin, Direct: 0.1 mg/dL (ref 0.0–0.3)
Total Bilirubin: 0.4 mg/dL (ref 0.2–1.2)
Total Protein: 7 g/dL (ref 6.0–8.3)

## 2018-10-15 LAB — MICROALBUMIN / CREATININE URINE RATIO
Creatinine,U: 160.6 mg/dL
Microalb Creat Ratio: 3.2 mg/g (ref 0.0–30.0)
Microalb, Ur: 5.2 mg/dL — ABNORMAL HIGH (ref 0.0–1.9)

## 2018-10-15 LAB — HEMOGLOBIN A1C: Hgb A1c MFr Bld: 6.1 % (ref 4.6–6.5)

## 2018-10-15 NOTE — Patient Instructions (Addendum)
Call Center for [facility] at [facility] at [phone] for an appointment.  They are located at [address], [place], [place], [address] (right across the hall from our office).  Give [place] 2-3 business days to get the results of your labs back.   Keep the diet clean and stay active.  Someone should reach out in the next several business days to get your mammogram and bone density test set up. If you don't hear anything, let us know.  Let us know if you need anything.

## 2018-10-15 NOTE — Progress Notes (Signed)
Chief Complaint  Patient presents with  . New Patient (Initial Visit)     Well Woman Sharon Yu is here for a complete physical.   Her last physical was >1 year ago.  Current diet: in general, a "healthy" diet. Current exercise: walking, yoga/pilates. Weight is stable and shedenies daytime fatigue. No LMP recorded. Patient is postmenopausal. Seatbelt? Yes  Health Maintenance Colonoscopy- Yes Shingrix- Yes DEXA- No Mammogram- No Tetanus- No Pneumonia- No Hep C screen- No  Past Medical History:  Diagnosis Date  . Arthritis   . Back pain    buldging disc  . Chronic back pain   . Hyperlipidemia    takes Crestor daily  . Hypertension    takes Maxzide and Metoprolol daily along with Ramipril  . Hypopotassemia    takes Potassium pill daily  . Joint pain   . Joint swelling   . Nonischemic cardiomyopathy (HCC)    a. 07/2017: EF reduced to 25-30%, cath showing normal cors.   . Shortness of breath    with exertion     Past Surgical History:  Procedure Laterality Date  . bilateral knee arthroscopies    . carpel tunnel     bilateral  . CHOLECYSTECTOMY    . colonscopy    . JOINT REPLACEMENT Left   . RIGHT/LEFT HEART CATH AND CORONARY ANGIOGRAPHY N/A 07/17/2017   Procedure: RIGHT/LEFT HEART CATH AND CORONARY ANGIOGRAPHY;  Surgeon: Swazil[place], Peter M, MD;  Location: [facility] INVASIVE CV LAB;  Service: Cardiovascular;  Laterality: N/A;  . TOTAL KNEE ARTHROPLASTY Right 02/19/2013   Procedure: TOTAL KNEE ARTHROPLASTY;  Surgeon: Thera Flake., MD;  Location: [facility] OR;  Service: Orthopedics;  Laterality: Right;  right total knee arthroplasty  . TOTAL KNEE REVISION Left 07/02/2013   Procedure: LEFT TOTAL KNEE TIBIA REVISION;  Surgeon: Nestor Lewandowsky, MD;  Location: [facility] OR;  Service: Orthopedics;  Laterality: Left;    Medications  Current Outpatient Medications on File Prior to Visit  Medication Sig Dispense Refill  . alendronate (FOSAMAX) 70 MG tablet Take 70 mg by mouth every 7  (seven) days. Saturdays. Take with a full glass of water on an empty stomach.    Marland Kitchen aspirin 81 MG chewable tablet Chew 81 mg daily by mouth.    Marland Kitchen atorvastatin (LIPITOR) 40 MG tablet Take 1 tablet (40 mg total) by mouth daily at 6 PM. 30 tablet 6  . carvedilol (COREG) 6.25 MG tablet Take 1 tablet (6.25 mg total) by mouth 2 (two) times daily with a meal. 60 tablet 3  . furosemide (LASIX) 40 MG tablet Take 1 tablet (40 mg total) by mouth daily. 30 tablet 1  . meloxicam (MOBIC) 15 MG tablet Take 15 mg by mouth daily as needed for pain.    . Multiple Vitamin (MULITIVITAMIN WITH MINERALS) TABS Take 1 tablet by mouth daily.    . potassium gluconate 595 (99 K) MG TABS tablet Take 1 tablet (595 mg total) daily by mouth. 30 tablet 1  . RESTASIS 0.05 % ophthalmic emulsion INSTILL 1 DROP INTO EACH EYE TWICE DAILY AS DIRECTED  3  . sacubitril-valsartan (ENTRESTO) 97-103 MG Take 1 tablet by mouth 2 (two) times daily. 60 tablet 11  . Vitamin D3 (VITAMIN D) 25 MCG tablet Take 1,000 Units by mouth daily.    . vitamin E (VITAMIN E) 400 UNIT capsule Take 400 Units by mouth daily.    . metFORMIN (GLUCOPHAGE) 500 MG tablet Take 1 tablet (500 mg total) by mouth daily  with breakfast. 60 tablet 1   Allergies Allergies  Allergen Reactions  . Other Hives and Swelling    "Grapple" (hybrid apple & grape)    Review of Systems: Constitutional:  no sweats Eye:  no recent significant change in vision Ear/Nose/Mouth/Throat:  Ears:  No changes in hearing Nose/Mouth/Throat:  no complaints of nasal congestion, no sore throat Cardiovascular: no chest pain Respiratory:  No cough and no shortness of breath Gastrointestinal:  no abdominal pain, no change in bowel habits GU:  Female: negative for dysuria or pelvic pain Musculoskeletal/Extremities:  no pain of the joints Integumentary (Skin/Breast):  no abnormal skin lesions reported Neurologic:  no headaches Psychiatric:  no anxiety, no depression Endocrine:  denies  unexplained weight changes Hematologic/Lymphatic:  no abnormal bleeding  Exam BP 128/77 (BP Location: Left Arm, Patient Position: Sitting, Cuff Size: Normal)   Pulse 76   Temp 98.8 F (37.1 C) (Oral)   Ht 5\' 7"  (1.702 m)   Wt 247 lb (112 kg)   SpO2 98%   BMI 38.69 kg/m  General:  well developed, well nourished, in no apparent distress Skin:  no significant moles, warts, or growths Head:  no masses, lesions, or tenderness Eyes:  pupils equal and round, sclera anicteric without injection Ears:  canals without lesions, TMs shiny without retraction, no obvious effusion, no erythema Nose:  nares patent, septum midline, mucosa normal, and no drainage or sinus tenderness Throat/Pharynx:  lips and gingiva without lesion; tongue and uvula midline; non-inflamed pharynx; no exudates or postnasal drainage Neck: neck supple without adenopathy, thyromegaly, or masses Lungs:  clear to auscultation, breath sounds equal bilaterally, no respiratory distress Cardio:  regular rate and rhythm, no bruits or LE edema Abdomen:  abdomen soft, nontender; bowel sounds normal; no masses or organomegaly Genital: Deferred Musculoskeletal:  symmetrical muscle groups noted without atrophy or deformity Extremities:  no clubbing, cyanosis, or edema, no deformities, no skin discoloration Neuro:  gait normal; deep tendon reflexes normal and symmetric Psych: well oriented with normal range of affect and appropriate judgment/insight  Assessment and Plan  Well adult exam - Plan: Hepatic function panel  Screening for breast cancer - Plan: MM DIGITAL SCREENING BILATERAL  Encounter for hepatitis C screening test for low risk patient - Plan: Hepatitis C antibody  Estrogen deficiency - Plan: DG Bone Density  Diabetes mellitus type 2 in obese (HCC) - Plan: Hemoglobin A1c, Microalbumin / creatinine urine ratio   Well 68 y.o. female. Counseled on diet and exercise. GYN contact permission for across all  provided. Updated health maintenance. Other orders as above. Follow up in 6 months pending the above workup. The patient voiced understanding and agreement to the plan.  Jilda Roche [place], DO 10/15/18 11:08 AM

## 2018-10-16 LAB — HEPATITIS C ANTIBODY
Hepatitis C Ab: NONREACTIVE
SIGNAL TO CUT-OFF: 0.02 (ref <1.00)

## 2018-11-09 ENCOUNTER — Other Ambulatory Visit: Payer: Self-pay | Admitting: Family Medicine

## 2018-11-09 ENCOUNTER — Ambulatory Visit (HOSPITAL_BASED_OUTPATIENT_CLINIC_OR_DEPARTMENT_OTHER)
Admission: RE | Admit: 2018-11-09 | Discharge: 2018-11-09 | Disposition: A | Discharge status: Home / Self Care | Payer: Medicare Other | Source: Ambulatory Visit | Attending: Family Medicine | Admitting: Family Medicine

## 2018-11-09 ENCOUNTER — Encounter (HOSPITAL_BASED_OUTPATIENT_CLINIC_OR_DEPARTMENT_OTHER): Payer: Self-pay

## 2018-11-09 DIAGNOSIS — Z1239 Encounter for other screening for malignant neoplasm of breast: Secondary | ICD-10-CM | POA: Diagnosis present

## 2018-11-09 DIAGNOSIS — Z13820 Encounter for screening for osteoporosis: Secondary | ICD-10-CM | POA: Insufficient documentation

## 2018-11-09 DIAGNOSIS — E2839 Other primary ovarian failure: Secondary | ICD-10-CM | POA: Diagnosis present

## 2018-11-09 DIAGNOSIS — Z1231 Encounter for screening mammogram for malignant neoplasm of breast: Secondary | ICD-10-CM | POA: Diagnosis not present

## 2018-11-26 ENCOUNTER — Telehealth: Payer: Self-pay | Admitting: Family Medicine

## 2018-11-26 MED ORDER — METFORMIN HCL 500 MG PO TABS: 500.0000 mg | ORAL_TABLET | Freq: Every day | ORAL | 0 refills | Status: DC

## 2018-11-26 MED ORDER — FUROSEMIDE 40 MG PO TABS: 40.0000 mg | ORAL_TABLET | Freq: Every day | ORAL | 0 refills | Status: DC

## 2018-11-26 MED ORDER — SACUBITRIL-VALSARTAN 97-103 MG PO TABS: 1.0000 | ORAL_TABLET | Freq: Two times a day (BID) | ORAL | 0 refills | Status: DC

## 2018-11-26 MED ORDER — ATORVASTATIN CALCIUM 40 MG PO TABS: 40.0000 mg | ORAL_TABLET | Freq: Every day | ORAL | 0 refills | Status: DC

## 2018-11-26 MED ORDER — CARVEDILOL 6.25 MG PO TABS: 6.2500 mg | ORAL_TABLET | Freq: Two times a day (BID) | ORAL | 0 refills | Status: DC

## 2018-11-26 NOTE — Telephone Encounter (Signed)
Sent in prescriptions as requested///patient informed

## 2018-11-26 NOTE — Telephone Encounter (Signed)
Copied from CRM [id number]. Topic: Quick Communication - Rx Refill/Question >> Nov 26, 2018  8:37 AM Elliot Gault wrote:  Medication: 7 day supply atorvastatin (LIPITOR) 40 MG tablet , sacubitril-valsartan (ENTRESTO) 97-103 MG, carvedilol (COREG) 6.25 MG tablet , furosemide (LASIX) 40 MG tablet , metFORMIN (GLUCOPHAGE) 500 MG tablet. (patient mother had surgery and recover is taking longer then expected therefore requesting a short supply  Preferred Pharmacy (with phone number or street name): [facility] at [facility] [place], [place] [address] [phone] / [phone] (toll-free)   Agent: Please be advised that RX refills may take up to 3 business days. We ask that you follow-up with your pharmacy.

## 2019-04-19 ENCOUNTER — Encounter: Payer: Self-pay | Admitting: Family Medicine

## 2019-04-19 ENCOUNTER — Other Ambulatory Visit: Payer: Self-pay

## 2019-04-19 ENCOUNTER — Ambulatory Visit (INDEPENDENT_AMBULATORY_CARE_PROVIDER_SITE_OTHER): Payer: Medicare Other | Admitting: Family Medicine

## 2019-04-19 DIAGNOSIS — R197 Diarrhea, unspecified: Secondary | ICD-10-CM | POA: Diagnosis not present

## 2019-04-19 DIAGNOSIS — E669 Obesity, unspecified: Secondary | ICD-10-CM | POA: Diagnosis not present

## 2019-04-19 DIAGNOSIS — Z20822 Contact with and (suspected) exposure to covid-19: Secondary | ICD-10-CM

## 2019-04-19 DIAGNOSIS — E1169 Type 2 diabetes mellitus with other specified complication: Secondary | ICD-10-CM

## 2019-04-19 NOTE — Progress Notes (Signed)
Subjective:   Chief Complaint  Patient presents with  . Follow-up  . Cough  . Diarrhea    Sharon Yu is a 68 y.o. female here for follow-up of diabetes.  Due to COVID-19 pandemic, we are interacting via web portal for an electronic face-to-face visit. I verified patient's ID using 2 identifiers. Patient agreed to proceed with visit via this method. Patient is in car, I am at office. Patient and I are present for visit.  Sharon Yu does not check sugars routinely. She questions her diagnosis Patient does not require insulin.   Medications include: Was on Metformin, stopped for now Exercise: walking Diet is fair  Went to funeral a little over week ago. Started having coughing and diarrhea around 1 week ago. Feels pretty good now. Would like to be tested for COVID. Denies fevers or sob.   Past Medical History:  Diagnosis Date  . Arthritis   . Back pain    buldging disc  . Chronic back pain   . Hyperlipidemia    takes Crestor daily  . Hypertension    takes Maxzide and Metoprolol daily along with Ramipril  . Hypopotassemia    takes Potassium pill daily  . Joint pain   . Joint swelling   . Nonischemic cardiomyopathy ([place])    a. 07/2017: EF reduced to 25-30%, cath showing normal cors.   . Shortness of breath    with exertion     Related testing: Date of retinal exam: Due Pneumovax: Done  Review of Systems: Pulmonary:  No SOB Cardiovascular:  No chest pain  Objective:  No conversational dyspnea Age appropriate judgment and insight Nml affect and mood  Assessment:   Diabetes mellitus type 2 in obese [facility]) - Plan: will wait until COVID test returns to get labs. Will stop metformin, could consider SLGT-2 if she needs something.   Diarrhea of presumed infectious origin - Plan: CANCELED: Novel Coronavirus, NAA ([facility]), counseled on this since seen, wearing a mask, and supportive care.  Plan:   Orders as above. Counseled on diet and exercise. F/u pending above. The  patient voiced understanding and agreement to the plan.  [place], DO 04/19/19 12:08 PM

## 2019-04-21 LAB — SPECIMEN STATUS REPORT

## 2019-04-21 LAB — NOVEL CORONAVIRUS, NAA: SARS-CoV-2, NAA: NOT DETECTED

## 2019-04-27 ENCOUNTER — Other Ambulatory Visit: Payer: Self-pay | Admitting: Family Medicine

## 2019-04-27 MED ORDER — FUROSEMIDE 40 MG PO TABS: 40.0000 mg | ORAL_TABLET | Freq: Every day | ORAL | 0 refills | Status: DC

## 2019-04-28 ENCOUNTER — Ambulatory Visit (INDEPENDENT_AMBULATORY_CARE_PROVIDER_SITE_OTHER): Payer: Medicare Other | Admitting: Internal Medicine

## 2019-04-28 ENCOUNTER — Encounter: Payer: Self-pay | Admitting: Internal Medicine

## 2019-04-28 ENCOUNTER — Other Ambulatory Visit: Payer: Self-pay

## 2019-04-28 VITALS — BP 148/86 | HR 79 | Temp 97.3°F | Ht 67.0 in | Wt 248.2 lb

## 2019-04-28 DIAGNOSIS — I5042 Chronic combined systolic (congestive) and diastolic (congestive) heart failure: Secondary | ICD-10-CM | POA: Diagnosis not present

## 2019-04-28 DIAGNOSIS — I712 Thoracic aortic aneurysm, without rupture: Secondary | ICD-10-CM | POA: Diagnosis not present

## 2019-04-28 DIAGNOSIS — I428 Other cardiomyopathies: Secondary | ICD-10-CM

## 2019-04-28 DIAGNOSIS — I7121 Aneurysm of the ascending aorta, without rupture: Secondary | ICD-10-CM

## 2019-04-28 DIAGNOSIS — I10 Essential (primary) hypertension: Secondary | ICD-10-CM

## 2019-04-28 NOTE — Progress Notes (Signed)
OFFICE NOTE  Chief Complaint:  Follow-up heart failure  Primary Care Physician: Shelda Pal, DO  HPI:  Sharon Yu is a 68 y.o. female with a past medial history significant for hypertension, dyslipidemia, recent hospitalization for heart failure.  She was found to have a nonischemic cardiomyopathy with normal coronary arteries on cardiac catheterization in November 2018.  LVEF on echo was 25-30%.  She was started on carvedilol and losartan.  After discharge blood pressure remained elevated and her losartan was switched to olmesartan.  Carvedilol was then increased.  She reports she is doing well on the medications.  Initially blood pressure was elevated today however repeat came down to 124/78.  Weight is within a few pounds where it had been previously.  She denies any new or worsening edema.  She denies any chest pain.  She reports that she is going to start to exercise more with silver sneakers.  She attends the [facility] in [facility].  She was also found to have a 4.3 cm ascending aortic aneurysm.  This will need to be reimaged in approximately 1 year.  04/08/2018  Sharon Yu returns today for heart failure follow-up.  We recently performed a repeat echocardiogram, she indicates improvement in LVEF from 25 to 30% up to 40 to 45%.  She is tolerating the addition of carvedilol and is on losartan.  She also takes low-dose aspirin, atorvastatin and furosemide 40 mg daily.  She would does report symptomatic improvement and currently endorses NYHA class II symptoms.  Echo shows stability of her 4.3 cm ascending aortic aneurysm and we will recommend following this annually.  We may consider a CT scan of the aorta during the next study to more fully visualize the aortic arch and proximal descending aorta.  07/09/2018  Sharon Yu is seen today for follow-up heart failure.  Recently her LVEF improved from 25 to 30% up to 40 to 45%.  She is on moderate dose Entresto.  She endorses no  more than NYHA class II symptoms.  She was noted to have a 4.3 cm ascending aortic aneurysm which will need routine follow-up.  Her last echo was in July 2019.  She is had about 6 pound weight loss this year.  She is on moderate dose Lasix.  She seems to be tolerating medications and notes her blood pressure at home is around the 120s over low 70s.  I think there is room to increase her Entresto further.  She says it cost her about $80 for a 11-month supply which she said is affordable at this time and she is not receiving patient assistance because she did not qualify financially.  04/28/2019  Sharon Yu seen today for heart failure follow-up.  Overall she says she is feeling well.  She had a recent echo which showed a small interval improvement in LV function up to 45 to 50%.  She has had escalating doses of Entresto now on max dose 97/103 mg twice daily.  She is also on carvedilol 6.25 mg twice daily.  Overall she endorses NYHA class I-II symptoms.  EKG today shows sinus rhythm at 79.  PMHx:  Past Medical History:  Diagnosis Date  . Arthritis   . Back pain    buldging disc  . Chronic back pain   . Hyperlipidemia    takes Crestor daily  . Hypertension    takes Maxzide and Metoprolol daily along with Ramipril  . Hypopotassemia    takes Potassium pill daily  .  Joint pain   . Joint swelling   . Nonischemic cardiomyopathy (HCC)    a. 07/2017: EF reduced to 25-30%, cath showing normal cors.   . Shortness of breath    with exertion    Past Surgical History:  Procedure Laterality Date  . bilateral knee arthroscopies    . carpel tunnel     bilateral  . CHOLECYSTECTOMY    . colonscopy    . JOINT REPLACEMENT Left   . RIGHT/LEFT HEART CATH AND CORONARY ANGIOGRAPHY N/A 07/17/2017   Procedure: RIGHT/LEFT HEART CATH AND CORONARY ANGIOGRAPHY;  Surgeon: SwazilandJordan, Peter M, MD;  [facility];  Service: Cardiovascular;  Laterality: N/A;  . TOTAL KNEE ARTHROPLASTY Right 02/19/2013    Procedure: TOTAL KNEE ARTHROPLASTY;  Surgeon: Thera FlakeW D Caffrey Jr., MD;  Location: [facility] OR;  Service: Orthopedics;  Laterality: Right;  right total knee arthroplasty  . TOTAL KNEE REVISION Left 07/02/2013   Procedure: LEFT TOTAL KNEE TIBIA REVISION;  Surgeon: Nestor LewandowskyFrank J Rowan, MD;  Location: [facility] OR;  Service: Orthopedics;  Laterality: Left;    FAMHx:  Family History  Problem Relation Age of Onset  . Hypertension Mother   . Arthritis Mother   . Asthma Mother   . Diabetes Mother   . Hyperlipidemia Mother   . Cancer Mother        uterine cancer  . CAD Sister   . Heart failure Sister   . Hyperlipidemia Sister   . Heart disease Sister   . Early death Father   . Asthma Brother   . COPD Brother   . Depression Brother   . AAA (abdominal aortic aneurysm) Daughter   . Hearing loss Daughter   . Early death Daughter [id number]       Spinal miningitis    SOCHx:   reports that she has never smoked. She has never used smokeless tobacco. She reports that she does not drink alcohol or use drugs.  ALLERGIES:  Allergies  Allergen Reactions  . Other Hives and Swelling    "Grapple" (hybrid apple & grape)    ROS: Pertinent items noted in HPI and remainder of comprehensive ROS otherwise negative.  HOME MEDS: Current Outpatient Medications on File Prior to Visit  Medication Sig Dispense Refill  . alendronate (FOSAMAX) 70 MG tablet Take 70 mg by mouth every 7 (seven) days. Saturdays. Take with a full glass of water on an empty stomach.    Marland Kitchen. aspirin 81 MG chewable tablet Chew 81 mg daily by mouth.    . carvedilol (COREG) 6.25 MG tablet Take 1 tablet (6.25 mg total) by mouth 2 (two) times daily with a meal. 14 tablet 0  . furosemide (LASIX) 40 MG tablet Take 1 tablet (40 mg total) by mouth daily. 30 tablet 0  . meloxicam (MOBIC) 15 MG tablet Take 15 mg by mouth daily as needed for pain.    . Multiple Vitamin (MULITIVITAMIN WITH MINERALS) TABS Take 1 tablet by mouth daily.    . potassium gluconate 595 (99 K) MG  TABS tablet Take 1 tablet (595 mg total) daily by mouth. 30 tablet 1  . Red Yeast Rice 600 MG CAPS Take 1 capsule by mouth daily.    . RESTASIS 0.05 % ophthalmic emulsion INSTILL 1 DROP INTO EACH EYE TWICE DAILY AS DIRECTED  3  . sacubitril-valsartan (ENTRESTO) 97-103 MG Take 1 tablet by mouth 2 (two) times daily.    . Vitamin D3 (VITAMIN D) 25 MCG tablet Take 1,000 Units by mouth  daily.    . vitamin E (VITAMIN E) 400 UNIT capsule Take 400 Units by mouth daily.     No current facility-administered medications on file prior to visit.     LABS/IMAGING: No results found for this or any previous visit (from the past 48 hour(s)). No results found.  LIPID PANEL: No results found for: CHOL, TRIG, HDL, CHOLHDL, VLDL, LDLCALC, LDLDIRECT   WEIGHTS: Wt Readings from Last 3 Encounters:  04/28/19 248 lb 3.2 oz (112.6 kg)  10/15/18 247 lb (112 kg)  07/09/18 248 lb 3.2 oz (112.6 kg)    VITALS: BP (!) 148/86   Pulse 79   Temp (!) 97.3 F (36.3 C)   Ht 5\' 7"  (1.702 m)   Wt 248 lb 3.2 oz (112.6 kg)   SpO2 99%   BMI 38.87 kg/m   EXAM: General appearance: alert, no distress and morbidly obese Neck: no carotid bruit, no JVD and thyroid not enlarged, symmetric, no tenderness/mass/nodules Lungs: clear to auscultation bilaterally Heart: regular rate and rhythm, S1, S2 normal, no murmur, click, rub or gallop Abdomen: soft, non-tender; bowel sounds normal; no masses,  no organomegaly Extremities: extremities normal, atraumatic, no cyanosis or edema Pulses: 2+ and symmetric Skin: Skin color, texture, turgor normal. No rashes or lesions Neurologic: Grossly normal Psych: Pleasant  EKG: Normal sinus rhythm at 79-personally reviewed  ASSESSMENT: 1. Chronic systolic congestive heart failure-NYHA class II symptoms, LVEF 45-50% (09/2018) 2. Nonischemic cardiomyopathy, normal coronary arteries by heart catheterization 07/2017 3. Hypertension 4. Dyslipidemia 5. Morbid obesity 6. 4.3 cm ascending  aortic aneurysm - measured 3.8 by echo (09/2018)  PLAN: 1.   Sharon Yu has NYHA class I-II symptoms.  Her LVEF has improved up to 45 to 50%.  Blood pressure was a little elevated today however she says is better controlled at home.  She needs to continue to work on weight loss this is been fairly stable.  She was previously noted to have a 4.3 cm ascending aortic aneurysm however her aorta looked normal on echo.  We may need to consider repeat CT scan to see if that was perhaps over measured.  We will consider this at follow-up.  Chrystie Nose, MD, [facility], FACP  [place]  [facility]  Medical Director of the Advanced Lipid Disorders &  Cardiovascular Risk Reduction Clinic Diplomate of the American Board of Clinical Lipidology Attending Cardiologist  Direct Dial: [phone]  Fax: [phone]  Website:  [email or web address] 04/28/2019, 3:56 PM

## 2019-04-28 NOTE — Patient Instructions (Addendum)
Medication Instructions:  Your physician recommends that you continue on your current medications as directed. Please refer to the Current Medication list given to you today.  If you need a refill on your cardiac medications before your next appointment, please call your pharmacy.    Testing/Procedures: LIMITED ECHOCARDIOGRAM in January 2021  Follow-Up: At [facility], you and your health needs are our priority.  As part of our continuing mission to provide you with exceptional heart care, we have created designated Provider Care Teams.  These Care Teams include your primary Cardiologist (physician) and Advanced Practice Providers (APPs -  Physician Assistants and Nurse Practitioners) who all work together to provide you with the care you need, when you need it. You will need a follow up appointment in 6 months.  Please call our office 2 months in advance to schedule this appointment.  You may see Pixie Casino, MD or one of the following Advanced Practice Providers on your designated Care Team: [place], [place] . Fabian Sharp, PA-C  Any Other Special Instructions Will Be Listed Below (If Applicable).

## 2019-05-20 ENCOUNTER — Other Ambulatory Visit (INDEPENDENT_AMBULATORY_CARE_PROVIDER_SITE_OTHER): Payer: Medicare Other

## 2019-05-20 ENCOUNTER — Other Ambulatory Visit: Payer: Self-pay

## 2019-05-20 ENCOUNTER — Other Ambulatory Visit: Payer: Self-pay | Admitting: Family Medicine

## 2019-05-20 ENCOUNTER — Telehealth: Payer: Self-pay | Admitting: Family Medicine

## 2019-05-20 DIAGNOSIS — E1169 Type 2 diabetes mellitus with other specified complication: Secondary | ICD-10-CM

## 2019-05-20 DIAGNOSIS — E669 Obesity, unspecified: Secondary | ICD-10-CM

## 2019-05-20 LAB — COMPREHENSIVE METABOLIC PANEL
ALT: 15 U/L (ref 0–35)
AST: 11 U/L (ref 0–37)
Albumin: 3.9 g/dL (ref 3.5–5.2)
Alkaline Phosphatase: 86 U/L (ref 39–117)
BUN: 16 mg/dL (ref 6–23)
CO2: 27 mEq/L (ref 19–32)
Calcium: 9 mg/dL (ref 8.4–10.5)
Chloride: 105 mEq/L (ref 96–112)
Creatinine, Ser: 0.81 mg/dL (ref 0.40–1.20)
GFR: 85.52 mL/min (ref >60.00)
Glucose, Bld: 117 mg/dL — ABNORMAL HIGH (ref 70–99)
Potassium: 3.6 mEq/L (ref 3.5–5.1)
Sodium: 141 mEq/L (ref 135–145)
Total Bilirubin: 0.3 mg/dL (ref 0.2–1.2)
Total Protein: 7.1 g/dL (ref 6.0–8.3)

## 2019-05-20 LAB — LIPID PANEL
Cholesterol: 201 mg/dL — ABNORMAL HIGH (ref 0–200)
HDL: 47.3 mg/dL (ref >39.00)
LDL Cholesterol: 134 mg/dL — ABNORMAL HIGH (ref 0–99)
NonHDL: 153.97
Total CHOL/HDL Ratio: 4
Triglycerides: 102 mg/dL (ref 0.0–149.0)
VLDL: 20.4 mg/dL (ref 0.0–40.0)

## 2019-05-20 LAB — HEMOGLOBIN A1C: Hgb A1c MFr Bld: 6.2 % (ref 4.6–6.5)

## 2019-05-20 MED ORDER — ATORVASTATIN CALCIUM 40 MG PO TABS: 40.0000 mg | ORAL_TABLET | Freq: Every day | ORAL | 3 refills | Status: DC

## 2019-05-20 MED ORDER — FUROSEMIDE 40 MG PO TABS: 40.0000 mg | ORAL_TABLET | Freq: Every day | ORAL | 0 refills | Status: DC

## 2019-05-20 NOTE — Progress Notes (Signed)
Lipitor called in. Labs ordered, please schedule pt in 6 weeks to reck labs. Ty.

## 2019-05-20 NOTE — Telephone Encounter (Signed)
Copied from [place] [id number]. Topic: General - Other >> May 20, 2019  2:06 PM Keene Breath wrote: Reason for CRM: Patient called to inform the nurse and doctor that she usually gets her prescriptions for a 90-day supply.  Patient would like to know if that is possible for future refills.  Please advise and let patient know at [phone]   Called to let the patient know that is no problem we will send in what she request. Sent in a 90 day refill of her lasix to [facility] in [place]

## 2019-05-20 NOTE — Progress Notes (Signed)
Called informed her of prescription sent in. Scheduled lab appt.

## 2019-05-28 ENCOUNTER — Other Ambulatory Visit: Payer: Self-pay | Admitting: Family Medicine

## 2019-05-28 MED ORDER — CARVEDILOL 6.25 MG PO TABS: 6.2500 mg | ORAL_TABLET | Freq: Two times a day (BID) | ORAL | 0 refills | Status: DC

## 2019-06-02 ENCOUNTER — Telehealth: Payer: Self-pay

## 2019-06-02 ENCOUNTER — Telehealth: Payer: Self-pay | Admitting: Family Medicine

## 2019-06-02 NOTE — Telephone Encounter (Signed)
Copied from [place] [phone]. Topic: General - Other >> Jun 02, 2019 10:00 AM Rutherford Nail, NT wrote: Reason for CRM: Patient calling and states that she just started seeing Dr Nani Ravens recently, but wanted to inform him that her prescriptions need to be 90 day supplies. Please advise.   Called the patient to acknowledge we received this message and ask if she was in need of any refills at this time and she is not.  Informed her to just let us know what/when she needs refills we will take care of for 90 days.

## 2019-06-02 NOTE — Telephone Encounter (Signed)
Copied from [place] [phone]. Topic: General - Inquiry >> Jun 02, 2019  3:22 PM Berneta Levins wrote: Reason for CRM:   Seth Bake with [facility] diabetes calling.  States that she needs to speak with someone to get pt's last A1C and blood pressure reading. Seth Bake can be reached at [phone].

## 2019-06-04 NOTE — Telephone Encounter (Signed)
Last BP--128/77 on 10/15/2018 Last A1c--6.2 on 05/20/2019 Called this number to report these and on hold for a lengthy time and never offered extensions to be transferred to.

## 2019-06-04 NOTE — Telephone Encounter (Signed)
Called unable to get on the phone 

## 2019-06-30 ENCOUNTER — Other Ambulatory Visit: Payer: Self-pay

## 2019-06-30 ENCOUNTER — Other Ambulatory Visit (INDEPENDENT_AMBULATORY_CARE_PROVIDER_SITE_OTHER): Payer: Medicare Other

## 2019-06-30 DIAGNOSIS — E669 Obesity, unspecified: Secondary | ICD-10-CM

## 2019-06-30 DIAGNOSIS — E1169 Type 2 diabetes mellitus with other specified complication: Secondary | ICD-10-CM

## 2019-06-30 LAB — LIPID PANEL
Cholesterol: 140 mg/dL (ref 0–200)
HDL: 50.8 mg/dL (ref >39.00)
LDL Cholesterol: 68 mg/dL (ref 0–99)
NonHDL: 89.6
Total CHOL/HDL Ratio: 3
Triglycerides: 107 mg/dL (ref 0.0–149.0)
VLDL: 21.4 mg/dL (ref 0.0–40.0)

## 2019-06-30 LAB — HEPATIC FUNCTION PANEL
ALT: 21 U/L (ref 0–35)
AST: 14 U/L (ref 0–37)
Albumin: 4.4 g/dL (ref 3.5–5.2)
Alkaline Phosphatase: 97 U/L (ref 39–117)
Bilirubin, Direct: 0.1 mg/dL (ref 0.0–0.3)
Total Bilirubin: 0.4 mg/dL (ref 0.2–1.2)
Total Protein: 7.8 g/dL (ref 6.0–8.3)

## 2019-07-01 ENCOUNTER — Other Ambulatory Visit: Payer: Medicare Other

## 2019-07-05 ENCOUNTER — Other Ambulatory Visit: Payer: Self-pay | Admitting: Family Medicine

## 2019-07-05 ENCOUNTER — Telehealth: Payer: Self-pay | Admitting: Family Medicine

## 2019-07-05 NOTE — Telephone Encounter (Signed)
Pt request 90 day to be notedd for all meds

## 2019-07-05 NOTE — Telephone Encounter (Signed)
carvedilol (COREG) 6.25 MG tablet Pt pick this up Please re send this script as this is much cheaper for pt to have a 90 day supply than 30 for insurance purposes [place], [place] - [address] [phone] (Phone) [phone] (Fax)

## 2019-07-06 MED ORDER — CARVEDILOL 6.25 MG PO TABS: 6.2500 mg | ORAL_TABLET | Freq: Two times a day (BID) | ORAL | 1 refills | Status: DC

## 2019-08-28 ENCOUNTER — Other Ambulatory Visit: Payer: Self-pay | Admitting: Family Medicine

## 2019-09-18 ENCOUNTER — Other Ambulatory Visit: Payer: Self-pay | Admitting: Internal Medicine

## 2019-09-20 ENCOUNTER — Other Ambulatory Visit: Payer: Self-pay

## 2019-09-20 ENCOUNTER — Ambulatory Visit (HOSPITAL_COMMUNITY): Payer: Medicare PPO | Attending: Cardiovascular Disease

## 2019-09-20 ENCOUNTER — Other Ambulatory Visit: Payer: Self-pay | Admitting: Internal Medicine

## 2019-09-20 DIAGNOSIS — I428 Other cardiomyopathies: Secondary | ICD-10-CM | POA: Diagnosis present

## 2019-09-20 MED ORDER — PERFLUTREN LIPID MICROSPHERE
1.0000 mL | INTRAVENOUS | Status: AC | PRN
  Administered 2019-09-20: 1 mL via INTRAVENOUS

## 2019-09-20 NOTE — Telephone Encounter (Signed)
*  STAT* If patient is at the pharmacy, call can be transferred to refill team.   1. Which medications need to be refilled? (please list name of each medication and dose if known) sacubitril-valsartan (ENTRESTO) 97-103 MG  2. Which pharmacy/location (including street and city if local pharmacy) is medication to be sent to? [facility] - [place], [place] - [address]  3. Do they need a 30 day or 90 day supply? 90  Pt would like to pick up the medicine tomorrow so she does not run out

## 2019-09-21 NOTE — Telephone Encounter (Signed)
Rx(s) sent to pharmacy electronically.  

## 2019-09-23 NOTE — Telephone Encounter (Signed)
Medication was refilled on 09/21/19 by [facility] staff.

## 2019-09-24 ENCOUNTER — Telehealth: Payer: Self-pay | Admitting: Internal Medicine

## 2019-09-24 NOTE — Telephone Encounter (Signed)
New Message    Pt is returning call for results   Please call back  

## 2019-09-24 NOTE — Telephone Encounter (Signed)
Advised patient, verbalized understanding.   Chrystie Nose, MD  09/20/2019 2:48 PM EST    LVEF appears stable at 45-50%.  Dr Rexene Edison

## 2019-10-05 ENCOUNTER — Telehealth: Payer: Self-pay | Admitting: Family Medicine

## 2019-10-05 MED ORDER — METFORMIN HCL 500 MG PO TABS: 500.0000 mg | ORAL_TABLET | Freq: Every day | ORAL | 1 refills | Status: DC

## 2019-10-05 NOTE — Telephone Encounter (Signed)
That's fine. Plz make sure she has a CPE in March. Ty.

## 2019-10-05 NOTE — Telephone Encounter (Signed)
Sent in metformin Scheduled CPE for March.

## 2019-10-05 NOTE — Telephone Encounter (Signed)
The patient would like to start back on Metformin, but taking only once daily. She thinks it will help her with her sugar.  Ok to refill because she had not been on this

## 2019-10-05 NOTE — Telephone Encounter (Signed)
Received refill for metformin.  Patient currently is not on/current list. Stopped in 04/2019. Called to inquire if she requested this refill/or pharmacy? Left message to call back.

## 2019-10-05 NOTE — Addendum Note (Signed)
Addended by: Scharlene Gloss B on: 10/05/2019 05:06 PM   Modules accepted: Orders

## 2019-11-03 ENCOUNTER — Ambulatory Visit (INDEPENDENT_AMBULATORY_CARE_PROVIDER_SITE_OTHER): Payer: Medicare PPO | Admitting: Internal Medicine

## 2019-11-03 ENCOUNTER — Encounter: Payer: Self-pay | Admitting: Internal Medicine

## 2019-11-03 ENCOUNTER — Other Ambulatory Visit: Payer: Self-pay

## 2019-11-03 VITALS — BP 140/84 | HR 105 | Ht 67.0 in | Wt 261.0 lb

## 2019-11-03 DIAGNOSIS — I428 Other cardiomyopathies: Secondary | ICD-10-CM

## 2019-11-03 DIAGNOSIS — I10 Essential (primary) hypertension: Secondary | ICD-10-CM | POA: Diagnosis not present

## 2019-11-03 DIAGNOSIS — I5042 Chronic combined systolic (congestive) and diastolic (congestive) heart failure: Secondary | ICD-10-CM | POA: Diagnosis not present

## 2019-11-03 NOTE — Patient Instructions (Signed)

## 2019-11-03 NOTE — Progress Notes (Signed)
OFFICE NOTE  Chief Complaint:  Follow-up echo  Primary Care Physician: [name], DO  HPI:  Sharon Yu is a 69 y.o. female with a past medial history significant for hypertension, dyslipidemia, recent hospitalization for heart failure.  She was found to have a nonischemic cardiomyopathy with normal coronary arteries on cardiac catheterization in November 2018.  LVEF on echo was 25-30%.  She was started on carvedilol and losartan.  After discharge blood pressure remained elevated and her losartan was switched to olmesartan.  Carvedilol was then increased.  She reports she is doing well on the medications.  Initially blood pressure was elevated today however repeat came down to 124/78.  Weight is within a few pounds where it had been previously.  She denies any new or worsening edema.  She denies any chest pain.  She reports that she is going to start to exercise more with silver sneakers.  She attends the [facility] in [facility].  She was also found to have a 4.3 cm ascending aortic aneurysm.  This will need to be reimaged in approximately 1 year.  04/08/2018  Sharon Yu returns today for heart failure follow-up.  We recently performed a repeat echocardiogram, she indicates improvement in LVEF from 25 to 30% up to 40 to 45%.  She is tolerating the addition of carvedilol and is on losartan.  She also takes low-dose aspirin, atorvastatin and furosemide 40 mg daily.  She would does report symptomatic improvement and currently endorses NYHA class II symptoms.  Echo shows stability of her 4.3 cm ascending aortic aneurysm and we will recommend following this annually.  We may consider a CT scan of the aorta during the next study to more fully visualize the aortic arch and proximal descending aorta.  07/09/2018  Sharon Yu is seen today for follow-up heart failure.  Recently her LVEF improved from 25 to 30% up to 40 to 45%.  She is on moderate dose Entresto.  She endorses no more than  NYHA class II symptoms.  She was noted to have a 4.3 cm ascending aortic aneurysm which will need routine follow-up.  Her last echo was in July 2019.  She is had about 6 pound weight loss this year.  She is on moderate dose Lasix.  She seems to be tolerating medications and notes her blood pressure at home is around the 120s over low 70s.  I think there is room to increase her Entresto further.  She says it cost her about $80 for a 24-month supply which she said is affordable at this time and she is not receiving patient assistance because she did not qualify financially.  04/28/2019  Sharon Yu seen today for heart failure follow-up.  Overall she says she is feeling well.  She had a recent echo which showed a small interval improvement in LV function up to 45 to 50%.  She has had escalating doses of Entresto now on max dose 97/103 mg twice daily.  She is also on carvedilol 6.25 mg twice daily.  Overall she endorses NYHA class I-II symptoms.  EKG today shows sinus rhythm at 79.  11/03/2019  Sharon Yu returns today for follow-up.  Sadly she reports that her brother died today.  She says she was with family but because she did not want to miss the appointment came to the office.  She was interested in follow-up of her echo which shows a stable LVEF 45 to 50% but no significant improvement despite maximizing the dose  of her Entresto.  Blood pressure would not likely allow increases in her medication.  Heart rate was elevated today however she was rushed to get here and under stress.  He says in general her blood pressure is in the low 100s and heart rate in the 60s.  PMHx:  Past Medical History:  Diagnosis Date  . Arthritis   . Back pain    buldging disc  . Chronic back pain   . Hyperlipidemia    takes Crestor daily  . Hypertension    takes Maxzide and Metoprolol daily along with Ramipril  . Hypopotassemia    takes Potassium pill daily  . Joint pain   . Joint swelling   . Nonischemic  cardiomyopathy (HCC)    a. 07/2017: EF reduced to 25-30%, cath showing normal cors.   . Shortness of breath    with exertion    Past Surgical History:  Procedure Laterality Date  . bilateral knee arthroscopies    . carpel tunnel     bilateral  . CHOLECYSTECTOMY    . colonscopy    . JOINT REPLACEMENT Left   . RIGHT/LEFT HEART CATH AND CORONARY ANGIOGRAPHY N/A 07/17/2017   Procedure: RIGHT/LEFT HEART CATH AND CORONARY ANGIOGRAPHY;  Surgeon: Swaziland, Peter M, MD;  Location: [facility] INVASIVE CV LAB;  Service: Cardiovascular;  Laterality: N/A;  . TOTAL KNEE ARTHROPLASTY Right 02/19/2013   Procedure: TOTAL KNEE ARTHROPLASTY;  Surgeon: Thera Flake., MD;  Location: [facility] OR;  Service: Orthopedics;  Laterality: Right;  right total knee arthroplasty  . TOTAL KNEE REVISION Left 07/02/2013   Procedure: LEFT TOTAL KNEE TIBIA REVISION;  Surgeon: Nestor Lewandowsky, MD;  Location: [facility] OR;  Service: Orthopedics;  Laterality: Left;    FAMHx:  Family History  Problem Relation Age of Onset  . Hypertension Mother   . Arthritis Mother   . Asthma Mother   . Diabetes Mother   . Hyperlipidemia Mother   . Cancer Mother        uterine cancer  . CAD Sister   . Heart failure Sister   . Hyperlipidemia Sister   . Heart disease Sister   . Early death Father   . Asthma Brother   . COPD Brother   . Depression Brother   . AAA (abdominal aortic aneurysm) Daughter   . Hearing loss Daughter   . Early death Daughter 74       Spinal miningitis    SOCHx:   reports that she has never smoked. She has never used smokeless tobacco. She reports that she does not drink alcohol or use drugs.  ALLERGIES:  Allergies  Allergen Reactions  . Other Hives and Swelling    "Grapple" (hybrid apple & grape)    ROS: Pertinent items noted in HPI and remainder of comprehensive ROS otherwise negative.  HOME MEDS: Current Outpatient Medications on File Prior to Visit  Medication Sig Dispense Refill  . alendronate (FOSAMAX) 70 MG  tablet Take 70 mg by mouth every 7 (seven) days. Saturdays. Take with a full glass of water on an empty stomach.    Marland Kitchen aspirin 81 MG chewable tablet Chew 81 mg daily by mouth.    Marland Kitchen atorvastatin (LIPITOR) 40 MG tablet Take 1 tablet (40 mg total) by mouth daily. 90 tablet 3  . carvedilol (COREG) 6.25 MG tablet Take 1 tablet (6.25 mg total) by mouth 2 (two) times daily with a meal. 180 tablet 1  . ENTRESTO 97-103 MG Take 1 tablet by mouth twice  daily 180 tablet 3  . furosemide (LASIX) 40 MG tablet Take 1 tablet by mouth once daily 90 tablet 0  . meloxicam (MOBIC) 15 MG tablet Take 15 mg by mouth daily as needed for pain.    . metFORMIN (GLUCOPHAGE) 500 MG tablet Take 1 tablet (500 mg total) by mouth daily with breakfast. 90 tablet 1  . Multiple Vitamin (MULITIVITAMIN WITH MINERALS) TABS Take 1 tablet by mouth daily.    . potassium gluconate 595 (99 K) MG TABS tablet Take 1 tablet (595 mg total) daily by mouth. 30 tablet 1  . Red Yeast Rice 600 MG CAPS Take 1 capsule by mouth daily.    . RESTASIS 0.05 % ophthalmic emulsion INSTILL 1 DROP INTO EACH EYE TWICE DAILY AS DIRECTED  3  . Vitamin D3 (VITAMIN D) 25 MCG tablet Take 1,000 Units by mouth daily.    . vitamin E (VITAMIN E) 400 UNIT capsule Take 400 Units by mouth daily.     No current facility-administered medications on file prior to visit.    LABS/IMAGING: No results found for this or any previous visit (from the past 48 hour(s)). No results found.  LIPID PANEL:    Component Value Date/Time   CHOL 140 06/30/2019 1045   TRIG 107.0 06/30/2019 1045   HDL 50.80 06/30/2019 1045   CHOLHDL 3 06/30/2019 1045   VLDL 21.4 06/30/2019 1045   LDLCALC 68 06/30/2019 1045     WEIGHTS: Wt Readings from Last 3 Encounters:  11/03/19 261 lb (118.4 kg)  04/28/19 248 lb 3.2 oz (112.6 kg)  10/15/18 247 lb (112 kg)    VITALS: BP 140/84   Pulse (!) 105   Ht 5\' 7"  (1.702 m)   Wt 261 lb (118.4 kg)   SpO2 96%   BMI 40.88 kg/m   EXAM: General  appearance: alert, no distress and morbidly obese Neck: no carotid bruit, no JVD and thyroid not enlarged, symmetric, no tenderness/mass/nodules Lungs: clear to auscultation bilaterally Heart: regular rate and rhythm, S1, S2 normal, no murmur, click, rub or gallop Abdomen: soft, non-tender; bowel sounds normal; no masses,  no organomegaly Extremities: extremities normal, atraumatic, no cyanosis or edema Pulses: 2+ and symmetric Skin: Skin color, texture, turgor normal. No rashes or lesions Neurologic: Grossly normal Psych: Pleasant  EKG: Sinus tachycardia 105, possible left atrial enlargement -personally reviewed  ASSESSMENT: 1. Chronic systolic congestive heart failure-NYHA class II symptoms, LVEF 45-50% (10/2019) 2. Nonischemic cardiomyopathy, normal coronary arteries by heart catheterization 07/2017 3. Hypertension 4. Dyslipidemia 5. Morbid obesity 6. 4.3 cm ascending aortic aneurysm - measured 3.8 by echo (09/2018)  PLAN: 1.   Mrs. Eckerson has NYHA class II symptoms and stable LVEF of 45-50%.  There is not room to further uptitrate her medications.  Blood pressure at home is been well controlled but was elevated understandably today with the tragic death of her brother.  I would not make any further medication changes at this time.  Plan follow-up with me in 6 months or sooner as necessary.  Pixie Casino, MD, [facility], [place] Director of the Advanced Lipid Disorders &  Cardiovascular Risk Reduction Clinic Diplomate of the American Board of Clinical Lipidology Attending Cardiologist  Direct Dial: [phone]  Fax: [phone]  Website:  [email or web address] 11/03/2019, 4:50 PM

## 2019-11-19 NOTE — Progress Notes (Signed)
Nurse connected with patient 11/22/19 at 11:45 AM EDT by a telephone enabled telemedicine application and verified that I am speaking with the correct person using two identifiers. Patient stated full name and DOB. Patient gave permission to continue with virtual visit. Patient's location was at home and Nurse's location was at [place] office.   Subjective:   Sharon Yu is a 69 y.o. female who presents for an Initial Medicare Annual Wellness Visit.  Review of Systems    Home Safety/Smoke Alarms: Feels safe in home. Smoke alarms in place.  Lives alone. 1 story. Verbalizes good support system.   Female:     Mammo- 11/11/18. Pt states she has scheduled for next month.        Dexa scan-  11/09/18      CCS- pt states she will discuss w/ PCP at appt on Wed 11/24/19.     Objective:      Advanced Directives 11/22/2019 05/29/2018 07/15/2017 07/02/2013 06/25/2013 02/10/2013  Does Patient Have a Medical Advance Directive? No No No Patient does not have advance directive;Patient would not like information Patient does not have advance directive;Patient would not like information Patient does not have advance directive;Patient would like information  Would patient like information on creating a medical advance directive? No - Patient declined No - Patient declined No - Patient declined - - Advance directive packet given  Pre-existing out of facility DNR order (yellow form or pink MOST form) - - - - - No    Current Medications (verified) Outpatient Encounter Medications as of 11/22/2019  Medication Sig  . alendronate (FOSAMAX) 70 MG tablet Take 70 mg by mouth every 7 (seven) days. Saturdays. Take with a full glass of water on an empty stomach.  Marland Kitchen aspirin 81 MG chewable tablet Chew 81 mg daily by mouth.  Marland Kitchen atorvastatin (LIPITOR) 40 MG tablet Take 1 tablet (40 mg total) by mouth daily.  . carvedilol (COREG) 6.25 MG tablet Take 1 tablet (6.25 mg total) by mouth 2 (two) times daily with a meal.  .  ENTRESTO 97-103 MG Take 1 tablet by mouth twice daily  . furosemide (LASIX) 40 MG tablet Take 1 tablet by mouth once daily  . meloxicam (MOBIC) 15 MG tablet Take 15 mg by mouth daily as needed for pain.  . metFORMIN (GLUCOPHAGE) 500 MG tablet Take 1 tablet (500 mg total) by mouth daily with breakfast.  . Multiple Vitamin (MULITIVITAMIN WITH MINERALS) TABS Take 1 tablet by mouth daily.  . potassium gluconate 595 (99 K) MG TABS tablet Take 1 tablet (595 mg total) daily by mouth.  . RESTASIS 0.05 % ophthalmic emulsion INSTILL 1 DROP INTO EACH EYE TWICE DAILY AS DIRECTED  . Vitamin D3 (VITAMIN D) 25 MCG tablet Take 1,000 Units by mouth daily.  . vitamin E (VITAMIN E) 400 UNIT capsule Take 400 Units by mouth daily.  . Red Yeast Rice 600 MG CAPS Take 1 capsule by mouth daily.   No facility-administered encounter medications on file as of 11/22/2019.    Allergies (verified) Other   History: Past Medical History:  Diagnosis Date  . Arthritis   . Back pain    buldging disc  . Chronic back pain   . Hyperlipidemia    takes Crestor daily  . Hypertension    takes Maxzide and Metoprolol daily along with Ramipril  . Hypopotassemia    takes Potassium pill daily  . Joint pain   . Joint swelling   . Nonischemic cardiomyopathy (HCC)  a. 07/2017: EF reduced to 25-30%, cath showing normal cors.   . Shortness of breath    with exertion   Past Surgical History:  Procedure Laterality Date  . bilateral knee arthroscopies    . carpel tunnel     bilateral  . CHOLECYSTECTOMY    . colonscopy    . JOINT REPLACEMENT Left   . RIGHT/LEFT HEART CATH AND CORONARY ANGIOGRAPHY N/A 07/17/2017   Procedure: RIGHT/LEFT HEART CATH AND CORONARY ANGIOGRAPHY;  Surgeon: Swazil[place], Peter M, MD;  Location: [facility] INVASIVE CV LAB;  Service: Cardiovascular;  Laterality: N/A;  . TOTAL KNEE ARTHROPLASTY Right 02/19/2013   Procedure: TOTAL KNEE ARTHROPLASTY;  Surgeon: Thera Flake., MD;  Location: [facility] OR;  Service:  Orthopedics;  Laterality: Right;  right total knee arthroplasty  . TOTAL KNEE REVISION Left 07/02/2013   Procedure: LEFT TOTAL KNEE TIBIA REVISION;  Surgeon: Nestor Lewandowsky, MD;  Location: [facility] OR;  Service: Orthopedics;  Laterality: Left;   Family History  Problem Relation Age of Onset  . Hypertension Mother   . Arthritis Mother   . Asthma Mother   . Diabetes Mother   . Hyperlipidemia Mother   . Cancer Mother        uterine cancer  . CAD Sister   . Heart failure Sister   . Hyperlipidemia Sister   . Heart disease Sister   . Early death Father   . Asthma Brother   . COPD Brother   . Depression Brother   . AAA (abdominal aortic aneurysm) Daughter   . Hearing loss Daughter   . Early death Daughter 64       Spinal miningitis   Social History   Socioeconomic History  . Marital status: Married    Spouse name: Not on file  . Number of children: Not on file  . Years of education: Not on file  . Highest education level: Not on file  Occupational History  . Not on file  Tobacco Use  . Smoking status: Never Smoker  . Smokeless tobacco: Never Used  Substance and Sexual Activity  . Alcohol use: No  . Drug use: No  . Sexual activity: Yes    Birth control/protection: Post-menopausal  Other Topics Concern  . Not on file  Social History Narrative  . Not on file   Social Determinants of Health   Financial Resource Strain:   . Difficulty of Paying Living Expenses:   Food Insecurity:   . Worried About Programme researcher, broadcasting/film/video in the Last Year:   . Barista in the Last Year:   Transportation Needs:   . Freight forwarder (Medical):   Marland Kitchen Lack of Transportation (Non-Medical):   Physical Activity:   . Days of Exercise per Week:   . Minutes of Exercise per Session:   Stress:   . Feeling of Stress :   Social Connections:   . Frequency of Communication with Friends and Family:   . Frequency of Social Gatherings with Friends and Family:   . Attends Religious Services:   .  Active Member of Clubs or Organizations:   . Attends Banker Meetings:   Marland Kitchen Marital Status:     Tobacco Counseling Counseling given: Not Answered   Clinical Intake:  Pain : No/denies pain    Activities of Daily Living In your present state of health, do you have any difficulty performing the following activities: 11/22/2019  Hearing? N  Vision? N  Difficulty concentrating or making decisions?  N  Walking or climbing stairs? N  Dressing or bathing? N  Doing errands, shopping? N  Preparing Food and eating ? N  Using the Toilet? N  In the past six months, have you accidently leaked urine? N  Do you have problems with loss of bowel control? N  Managing your Medications? N  Managing your Finances? N  Housekeeping or managing your Housekeeping? N  Some recent data might be hidden     Immunizations and Health Maintenance Immunization History  Administered Date(s) Administered  . Influenza-Unspecified 05/13/2019  . Pneumococcal Polysaccharide-23 10/15/2018  . Tdap 10/15/2018   Health Maintenance Due  Topic Date Due  . FOOT EXAM  Never done  . COLONOSCOPY  Never done  . OPHTHALMOLOGY EXAM  01/01/2019  . URINE MICROALBUMIN  10/16/2019  . PNA vac Low Risk Adult (2 of 2 - PCV13) 10/16/2019  . HEMOGLOBIN A1C  11/17/2019    Patient Care Team: [name], DO as PCP - General (Family Medicine) Rennis Golden Lisette Abu, MD as PCP - Cardiology (Cardiology)  Indicate any recent Medical Services you may have received from other than [facility] providers in the past year (date may be approximate).     Assessment:   This is a routine wellness examination for [facility]. Physical assessment deferred to PCP.  Hearing/Vision screen Unable to assess. This visit is enabled though telemedicine due to Covid 19.   Dietary issues and exercise activities discussed: Current Exercise Habits: The patient does not participate in regular exercise at present, Exercise limited by: None  identified Diet (meal preparation, eat out, water intake, caffeinated beverages, dairy products, fruits and vegetables): well balanced    Goals    . Increase physical activity      Depression Screen PHQ 2/9 Scores 11/22/2019  PHQ - 2 Score 0    Fall Risk Fall Risk  11/22/2019  Falls in the past year? 0  Number falls in past yr: 0  Injury with Fall? 0  Follow up Education provided;Falls prevention discussed    Cognitive Function:Ad8 score reviewed for issues:  Issues making decisions:no  Less interest in hobbies / activities:no  Repeats questions, stories (family complaining):no  Trouble using ordinary gadgets (microwave, computer, phone):no  Forgets the month or year: no  Mismanaging finances: no  Remembering appts:no  Daily problems with thinking and/or memory:no Ad8 score is=0         Screening Tests Health Maintenance  Topic Date Due  . FOOT EXAM  Never done  . COLONOSCOPY  Never done  . OPHTHALMOLOGY EXAM  01/01/2019  . URINE MICROALBUMIN  10/16/2019  . PNA vac Low Risk Adult (2 of 2 - PCV13) 10/16/2019  . HEMOGLOBIN A1C  11/17/2019  . MAMMOGRAM  11/08/2020  . TETANUS/TDAP  10/15/2028  . INFLUENZA VACCINE  Completed  . DEXA SCAN  Completed  . Hepatitis C Screening  Completed     Plan:    Please schedule your next medicare wellness visit with me in 1 yr.  Continue to eat heart healthy diet (full of fruits, vegetables, whole grains, lean protein, water--limit salt, fat, and sugar intake) and increase physical activity as tolerated.  Continue doing brain stimulating activities (puzzles, reading, adult coloring books, staying active) to keep memory sharp.   I have personally reviewed and noted the following in the patient's chart:   . Medical and social history . Use of alcohol, tobacco or illicit drugs  . Current medications and supplements . Functional ability and status . Nutritional status .  Physical activity . Advanced directives . List of  other physicians . Hospitalizations, surgeries, and ER visits in previous 12 months . Vitals . Screenings to include cognitive, depression, and falls . Referrals and appointments  In addition, I have reviewed and discussed with patient certain preventive protocols, quality metrics, and best practice recommendations. A written personalized care plan for preventive services as well as general preventive health recommendations were provided to patient.     Naaman Plummer [place], [place]   11/22/2019

## 2019-11-22 ENCOUNTER — Encounter: Payer: Self-pay | Admitting: *Deleted

## 2019-11-22 ENCOUNTER — Other Ambulatory Visit: Payer: Self-pay

## 2019-11-22 ENCOUNTER — Ambulatory Visit (INDEPENDENT_AMBULATORY_CARE_PROVIDER_SITE_OTHER): Payer: Medicare PPO | Admitting: *Deleted

## 2019-11-22 DIAGNOSIS — Z0000 Encounter for general adult medical examination without abnormal findings: Secondary | ICD-10-CM | POA: Diagnosis not present

## 2019-11-22 NOTE — Patient Instructions (Signed)
Please schedule your next medicare wellness visit with me in 1 yr.  Continue to eat heart healthy diet (full of fruits, vegetables, whole grains, lean protein, water--limit salt, fat, and sugar intake) and increase physical activity as tolerated.  Continue doing brain stimulating activities (puzzles, reading, adult coloring books, staying active) to keep memory sharp.    [name] , Thank you for taking time to come for your Medicare Wellness Visit. I appreciate your ongoing commitment to your health goals. Please review the following plan we discussed and let me know if I can assist you in the future.   These are the goals we discussed: Goals    . Increase physical activity       This is a list of the screening recommended for you and due dates:  Health Maintenance  Topic Date Due  . Complete foot exam   Never done  . Colon Cancer Screening  Never done  . Eye exam for diabetics  01/01/2019  . Urine Protein Check  10/16/2019  . Pneumonia vaccines (2 of 2 - PCV13) 10/16/2019  . Hemoglobin A1C  11/17/2019  . Mammogram  11/08/2020  . Tetanus Vaccine  10/15/2028  . Flu Shot  Completed  . DEXA scan (bone density measurement)  Completed  .  Hepatitis C: One time screening is recommended by Center for Disease Control  (CDC) for  adults born from [birth year] through 1963.   Completed    Preventive Care 58 Years and Older, Female Preventive care refers to lifestyle choices and visits with your health care provider that can promote health and wellness. This includes:  A yearly physical exam. This is also called an annual well check.  Regular dental and eye exams.  Immunizations.  Screening for certain conditions.  Healthy lifestyle choices, such as diet and exercise. What can I expect for my preventive care visit? Physical exam Your health care provider will check:  Height and weight. These may be used to calculate body mass index (BMI), which is a measurement that tells if you are  at a healthy weight.  Heart rate and blood pressure.  Your skin for abnormal spots. Counseling Your health care provider may ask you questions about:  Alcohol, tobacco, and drug use.  Emotional well-being.  Home and relationship well-being.  Sexual activity.  Eating habits.  History of falls.  Memory and ability to understand (cognition).  Work and work Statistician.  Pregnancy and menstrual history. What immunizations do I need?  Influenza (flu) vaccine  This is recommended every year. Tetanus, diphtheria, and pertussis (Tdap) vaccine  You may need a Td booster every 10 years. Varicella (chickenpox) vaccine  You may need this vaccine if you have not already been vaccinated. Zoster (shingles) vaccine  You may need this after age 41. Pneumococcal conjugate (PCV13) vaccine  One dose is recommended after age 56. Pneumococcal polysaccharide (PPSV23) vaccine  One dose is recommended after age 49. Measles, mumps, and rubella (MMR) vaccine  You may need at least one dose of MMR if you were born in 1957 or later. You may also need a second dose. Meningococcal conjugate (MenACWY) vaccine  You may need this if you have certain conditions. Hepatitis A vaccine  You may need this if you have certain conditions or if you travel or work in places where you may be exposed to hepatitis A. Hepatitis B vaccine  You may need this if you have certain conditions or if you travel or work in places where you may  be exposed to hepatitis B. Haemophilus influenzae type b (Hib) vaccine  You may need this if you have certain conditions. You may receive vaccines as individual doses or as more than one vaccine together in one shot (combination vaccines). Talk with your health care provider about the risks and benefits of combination vaccines. What tests do I need? Blood tests  Lipid and cholesterol levels. These may be checked every 5 years, or more frequently depending on your  overall health.  Hepatitis C test.  Hepatitis B test. Screening  Lung cancer screening. You may have this screening every year starting at age 98 if you have a 30-pack-year history of smoking and currently smoke or have quit within the past 15 years.  Colorectal cancer screening. All adults should have this screening starting at age 3 and continuing until age 37. Your health care provider may recommend screening at age 64 if you are at increased risk. You will have tests every 1-10 years, depending on your results and the type of screening test.  Diabetes screening. This is done by checking your blood sugar (glucose) after you have not eaten for a while (fasting). You may have this done every 1-3 years.  Mammogram. This may be done every 1-2 years. Talk with your health care provider about how often you should have regular mammograms.  BRCA-related cancer screening. This may be done if you have a family history of breast, ovarian, tubal, or peritoneal cancers. Other tests  Sexually transmitted disease (STD) testing.  Bone density scan. This is done to screen for osteoporosis. You may have this done starting at age 31. Follow these instructions at home: Eating and drinking  Eat a diet that includes fresh fruits and vegetables, whole grains, lean protein, and low-fat dairy products. Limit your intake of foods with high amounts of sugar, saturated fats, and salt.  Take vitamin and mineral supplements as recommended by your health care provider.  Do not drink alcohol if your health care provider tells you not to drink.  If you drink alcohol: ? Limit how much you have to 0-1 drink a day. ? Be aware of how much alcohol is in your drink. In the [place] one drink equals one 12 oz bottle of beer (355 mL), one 5 oz glass of wine (148 mL), or one 1 oz glass of hard liquor (44 mL). Lifestyle  Take daily care of your teeth and gums.  Stay active. Exercise for at least 30 minutes on 5 or more  days each week.  Do not use any products that contain nicotine or tobacco, such as cigarettes, e-cigarettes, and chewing tobacco. If you need help quitting, ask your health care provider.  If you are sexually active, practice safe sex. Use a condom or other form of protection in order to prevent STIs (sexually transmitted infections).  Talk with your health care provider about taking a low-dose aspirin or statin. What's next?  Go to your health care provider once a year for a well check visit.  Ask your health care provider how often you should have your eyes and teeth checked.  Stay up to date on all vaccines. This information is not intended to replace advice given to you by your health care provider. Make sure you discuss any questions you have with your health care provider. Document Revised: 08/13/2018 Document Reviewed: 08/13/2018 Elsevier Patient Education  2020 [employer].

## 2019-11-23 ENCOUNTER — Other Ambulatory Visit: Payer: Self-pay

## 2019-11-24 ENCOUNTER — Encounter: Payer: Self-pay | Admitting: Family Medicine

## 2019-11-24 ENCOUNTER — Other Ambulatory Visit: Payer: Self-pay

## 2019-11-24 ENCOUNTER — Ambulatory Visit (INDEPENDENT_AMBULATORY_CARE_PROVIDER_SITE_OTHER): Payer: Medicare PPO | Admitting: Family Medicine

## 2019-11-24 VITALS — BP 144/84 | HR 94 | Temp 97.0°F | Ht 66.0 in | Wt 259.0 lb

## 2019-11-24 DIAGNOSIS — Z0000 Encounter for general adult medical examination without abnormal findings: Secondary | ICD-10-CM

## 2019-11-24 DIAGNOSIS — E669 Obesity, unspecified: Secondary | ICD-10-CM | POA: Diagnosis not present

## 2019-11-24 DIAGNOSIS — E1169 Type 2 diabetes mellitus with other specified complication: Secondary | ICD-10-CM

## 2019-11-24 LAB — LIPID PANEL
Cholesterol: 111 mg/dL (ref 0–200)
HDL: 43.9 mg/dL (ref >39.00)
LDL Cholesterol: 45 mg/dL (ref 0–99)
NonHDL: 66.78
Total CHOL/HDL Ratio: 3
Triglycerides: 109 mg/dL (ref 0.0–149.0)
VLDL: 21.8 mg/dL (ref 0.0–40.0)

## 2019-11-24 LAB — COMPREHENSIVE METABOLIC PANEL
ALT: 18 U/L (ref 0–35)
AST: 13 U/L (ref 0–37)
Albumin: 4.1 g/dL (ref 3.5–5.2)
Alkaline Phosphatase: 97 U/L (ref 39–117)
BUN: 16 mg/dL (ref 6–23)
CO2: 30 mEq/L (ref 19–32)
Calcium: 8.8 mg/dL (ref 8.4–10.5)
Chloride: 103 mEq/L (ref 96–112)
Creatinine, Ser: 0.86 mg/dL (ref 0.40–1.20)
GFR: 79.68 mL/min (ref >60.00)
Glucose, Bld: 117 mg/dL — ABNORMAL HIGH (ref 70–99)
Potassium: 3.5 mEq/L (ref 3.5–5.1)
Sodium: 142 mEq/L (ref 135–145)
Total Bilirubin: 0.3 mg/dL (ref 0.2–1.2)
Total Protein: 7.3 g/dL (ref 6.0–8.3)

## 2019-11-24 LAB — MICROALBUMIN / CREATININE URINE RATIO
Creatinine,U: 175.5 mg/dL
Microalb Creat Ratio: 15.3 mg/g (ref 0.0–30.0)
Microalb, Ur: 26.8 mg/dL — ABNORMAL HIGH (ref 0.0–1.9)

## 2019-11-24 LAB — HEMOGLOBIN A1C: Hgb A1c MFr Bld: 6.3 % (ref 4.6–6.5)

## 2019-11-24 MED ORDER — MELOXICAM 15 MG PO TABS: 15.0000 mg | ORAL_TABLET | Freq: Every day | ORAL | 5 refills | Status: DC | PRN

## 2019-11-24 MED ORDER — ALENDRONATE SODIUM 70 MG PO TABS: 70.0000 mg | ORAL_TABLET | ORAL | 2 refills | Status: DC

## 2019-11-24 NOTE — Patient Instructions (Signed)
Call your eye doctor.  Keep the diet clean and stay active.  Give [place] 2-3 business days to get the results of your labs back.   Let us know if you need anything.

## 2019-11-24 NOTE — Progress Notes (Signed)
Chief Complaint  Patient presents with  . Annual Exam  . Back Pain     Well Woman Sharon Yu is here for a complete physical. Her last physical was >1 year ago.  Current diet: in general, diet could be better. Current exercise: stretches. Weight is stable and she denies daytime fatigue. Seatbelt? Yes  Health Maintenance Colonoscopy- Yes Shingrix- Yes - 05/03/2018 and 08/02/2018 DEXA- Yes Mammogram- Yes Tetanus- Yes Pneumonia- Yes Hep C screen- Yes  Past Medical History:  Diagnosis Date  . Arthritis   . Back pain    buldging disc  . Chronic back pain   . Hyperlipidemia    takes Crestor daily  . Hypertension    takes Maxzide and Metoprolol daily along with Ramipril  . Hypopotassemia    takes Potassium pill daily  . Joint pain   . Joint swelling   . Nonischemic cardiomyopathy ([place])    a. 07/2017: EF reduced to 25-30%, cath showing normal cors.   . Shortness of breath    with exertion     Past Surgical History:  Procedure Laterality Date  . bilateral knee arthroscopies    . carpel tunnel     bilateral  . CHOLECYSTECTOMY    . colonscopy    . JOINT REPLACEMENT Left   . RIGHT/LEFT HEART CATH AND CORONARY ANGIOGRAPHY N/A 07/17/2017   Procedure: RIGHT/LEFT HEART CATH AND CORONARY ANGIOGRAPHY;  Surgeon: Martinique, Peter M, MD;  Location: [facility] CV LAB;  Service: Cardiovascular;  Laterality: N/A;  . TOTAL KNEE ARTHROPLASTY Right 02/19/2013   Procedure: TOTAL KNEE ARTHROPLASTY;  Surgeon: Yvette Rack., MD;  Location: [facility];  Service: Orthopedics;  Laterality: Right;  right total knee arthroplasty  . TOTAL KNEE REVISION Left 07/02/2013   Procedure: LEFT TOTAL KNEE TIBIA REVISION;  Surgeon: Kerin Salen, MD;  Location: [facility];  Service: Orthopedics;  Laterality: Left;    Medications  Current Outpatient Medications on File Prior to Visit  Medication Sig Dispense Refill  . alendronate (FOSAMAX) 70 MG tablet Take 70 mg by mouth every 7 (seven) days. Saturdays. Take  with a full glass of water on an empty stomach.    Marland Kitchen aspirin 81 MG chewable tablet Chew 81 mg daily by mouth.    Marland Kitchen atorvastatin (LIPITOR) 40 MG tablet Take 1 tablet (40 mg total) by mouth daily. 90 tablet 3  . carvedilol (COREG) 6.25 MG tablet Take 1 tablet (6.25 mg total) by mouth 2 (two) times daily with a meal. 180 tablet 1  . ENTRESTO 97-103 MG Take 1 tablet by mouth twice daily 180 tablet 3  . furosemide (LASIX) 40 MG tablet Take 1 tablet by mouth once daily 90 tablet 0  . metFORMIN (GLUCOPHAGE) 500 MG tablet Take 1 tablet (500 mg total) by mouth daily with breakfast. 90 tablet 1  . Multiple Vitamin (MULITIVITAMIN WITH MINERALS) TABS Take 1 tablet by mouth daily.    . potassium gluconate 595 (99 K) MG TABS tablet Take 1 tablet (595 mg total) daily by mouth. 30 tablet 1  . Red Yeast Rice 600 MG CAPS Take 1 capsule by mouth daily.    . RESTASIS 0.05 % ophthalmic emulsion INSTILL 1 DROP INTO EACH EYE TWICE DAILY AS DIRECTED  3  . Vitamin D3 (VITAMIN D) 25 MCG tablet Take 1,000 Units by mouth daily.    . vitamin E (VITAMIN E) 400 UNIT capsule Take 400 Units by mouth daily.      Allergies Allergies  Allergen  Reactions  . Other Hives and Swelling    "Grapple" (hybrid apple & grape)    Review of Systems: Constitutional:  no fevers Eye:  no recent significant change in vision Ears:  No changes in hearing Nose/Mouth/Throat:  no complaints of nasal congestion, no sore throat Cardiovascular: no chest pain Respiratory:  No shortness of breath Gastrointestinal:  No change in bowel habits GU:  Female: negative for dysuria Integumentary:  no abnormal skin lesions reported Neurologic:  no headaches Endocrine:  denies unexplained weight changes  Exam BP (!) 144/84 (BP Location: Left Arm, Patient Position: Sitting, Cuff Size: Normal)   Pulse 94   Temp (!) 97 F (36.1 C) (Temporal)   Ht 5\' 6"  (1.676 m)   Wt 259 lb (117.5 kg)   SpO2 97%   BMI 41.80 kg/m  General:  well developed, well  nourished, in no apparent distress Skin:  no significant moles, warts, or growths Head:  no masses, lesions, or tenderness Eyes:  pupils equal and round, sclera anicteric without injection Ears:  canals without lesions, TMs shiny without retraction, no obvious effusion, no erythema Nose:  nares patent, septum midline, mucosa normal, and no drainage or sinus tenderness Throat/Pharynx:  lips and gingiva without lesion; tongue and uvula midline; non-inflamed pharynx; no exudates or postnasal drainage Neck: neck supple without adenopathy, thyromegaly, or masses Lungs:  clear to auscultation, breath sounds equal bilaterally, no respiratory distress Cardio:  regular rate and rhythm, no bruits or LE edema Abdomen:  abdomen soft, nontender; bowel sounds normal; no masses or organomegaly Genital: Deferred Neuro:  gait slow (uses cane); deep tendon reflexes normal and symmetric Psych: well oriented with normal range of affect and appropriate judgment/insight  Assessment and Plan  Well adult exam - Plan: Comprehensive metabolic panel, Lipid panel  Diabetes mellitus type 2 in obese (HCC) - Plan: Hemoglobin A1c, Microalbumin / creatinine urine ratio   Well 69 y.o. female. Counseled on diet and exercise. Other orders as above. Follow up in 6 mo or prn. The patient voiced understanding and agreement to the plan.  [address] [place], DO 11/24/19 2:44 PM

## 2019-12-01 ENCOUNTER — Other Ambulatory Visit: Payer: Self-pay | Admitting: Family Medicine

## 2019-12-20 ENCOUNTER — Other Ambulatory Visit (HOSPITAL_BASED_OUTPATIENT_CLINIC_OR_DEPARTMENT_OTHER): Payer: Self-pay | Admitting: Family Medicine

## 2019-12-20 DIAGNOSIS — Z1231 Encounter for screening mammogram for malignant neoplasm of breast: Secondary | ICD-10-CM

## 2019-12-27 ENCOUNTER — Ambulatory Visit (HOSPITAL_BASED_OUTPATIENT_CLINIC_OR_DEPARTMENT_OTHER)
Admission: RE | Admit: 2019-12-27 | Discharge: 2019-12-27 | Disposition: A | Discharge status: Home / Self Care | Payer: Medicare PPO | Source: Ambulatory Visit | Attending: Family Medicine | Admitting: Family Medicine

## 2019-12-27 ENCOUNTER — Encounter (HOSPITAL_BASED_OUTPATIENT_CLINIC_OR_DEPARTMENT_OTHER): Payer: Self-pay

## 2019-12-27 ENCOUNTER — Other Ambulatory Visit: Payer: Self-pay

## 2019-12-27 DIAGNOSIS — Z1231 Encounter for screening mammogram for malignant neoplasm of breast: Secondary | ICD-10-CM

## 2020-01-04 ENCOUNTER — Other Ambulatory Visit: Payer: Self-pay | Admitting: Family Medicine

## 2020-02-15 IMAGING — MR MR LUMBAR SPINE W/O CM
4 of 5 series · 25 of 48 positions shown · non-contrast
Comparison: 06/07/2011

CLINICAL DATA: Low back pain radiating into both buttocks and into
the back

EXAM:
MRI LUMBAR SPINE WITHOUT CONTRAST
TECHNIQUE: Multiplanar, multisequence MR imaging of the lumbar spine was
performed. No intravenous contrast was administered.

[Series 2: T2 post-contrast · sagittal · 4.0mm · 0.55mm/px · 6 of 15 slices shown]
[im 1/15]
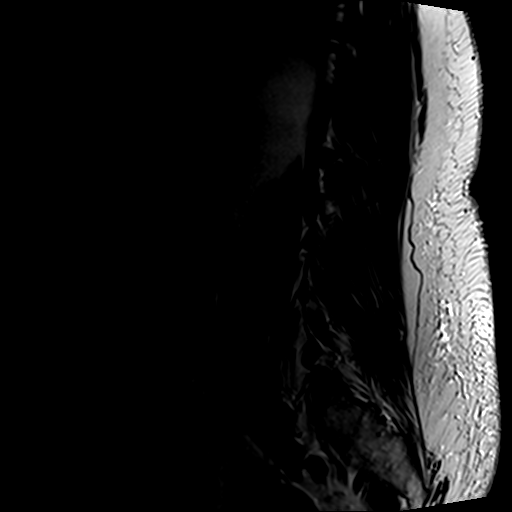
[im 3/15]
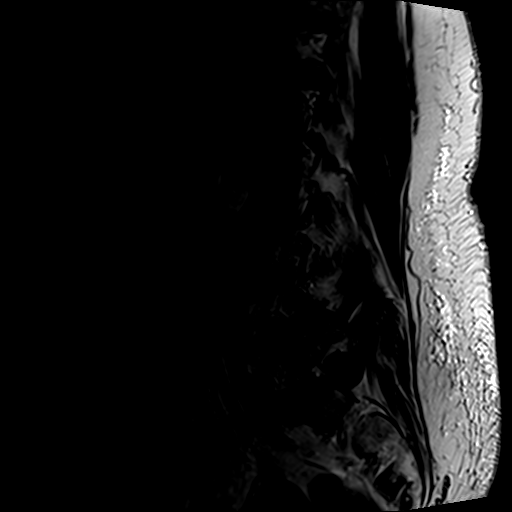
[im 6/15]
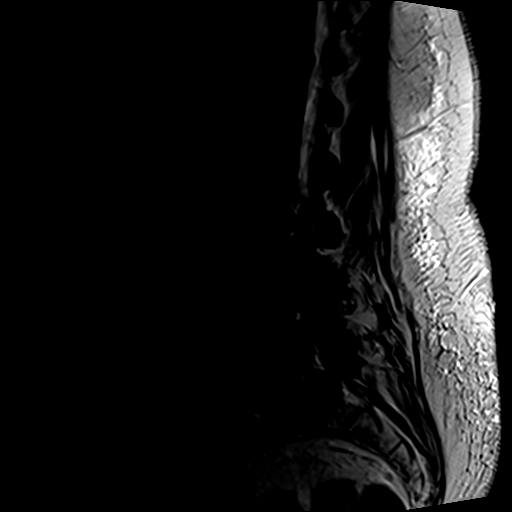
[im 9/15]
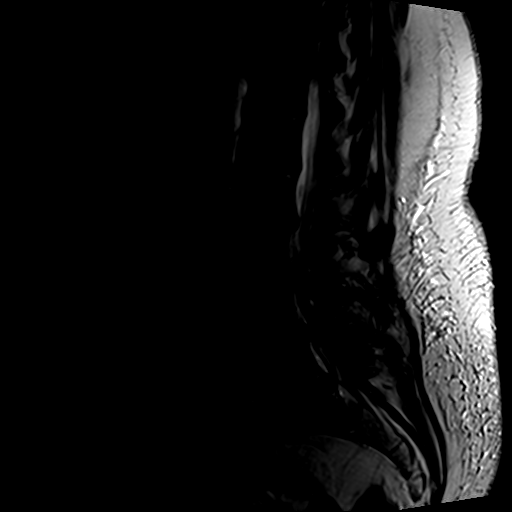
[im 12/15]
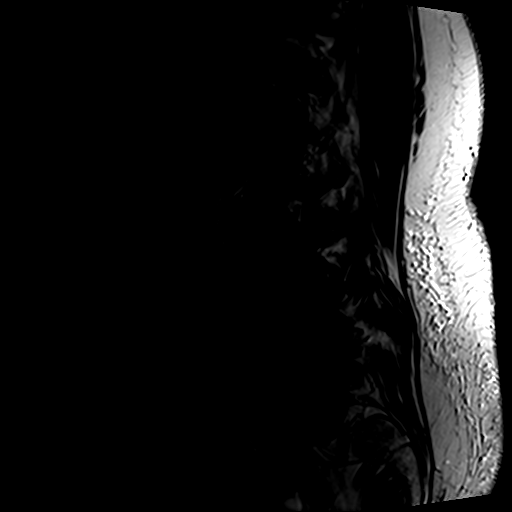
[im 15/15]
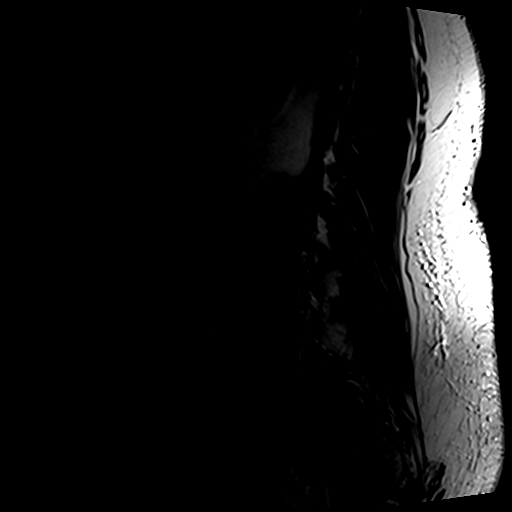

[Series 4: T1 · sagittal · 4.0mm · 0.55mm/px · 6 of 15 slices shown (1 of 2)]
[im 1/15]
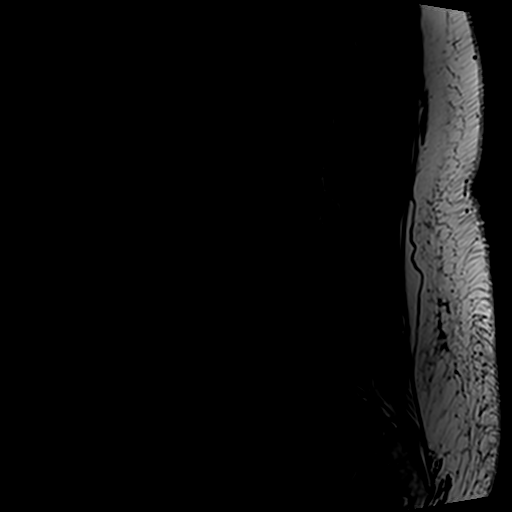
[im 3/15]
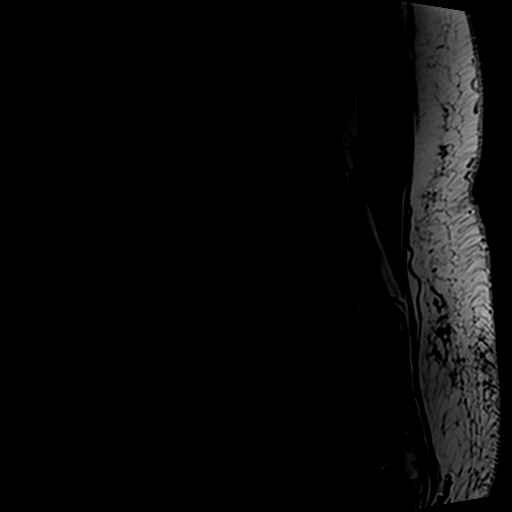
[im 6/15]
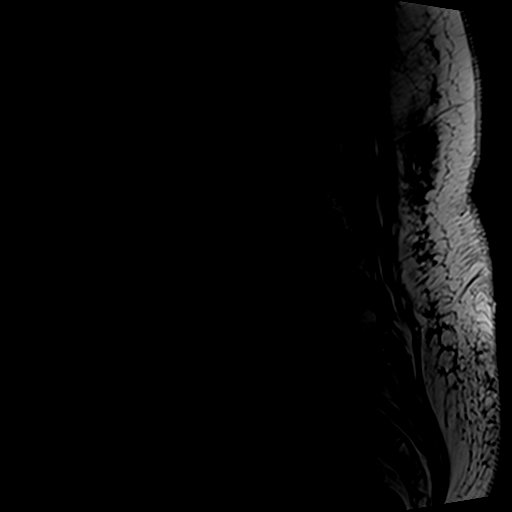
[im 9/15]
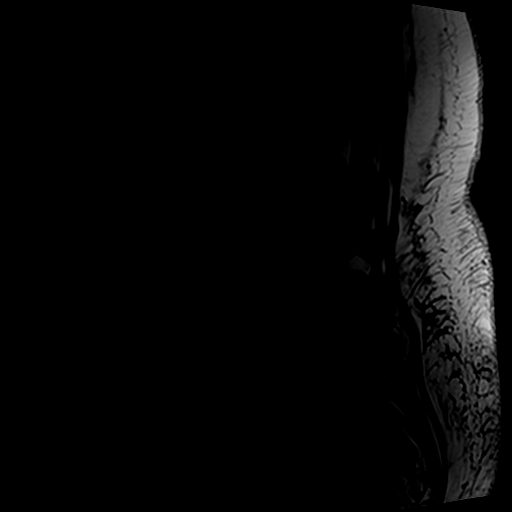
[im 12/15]
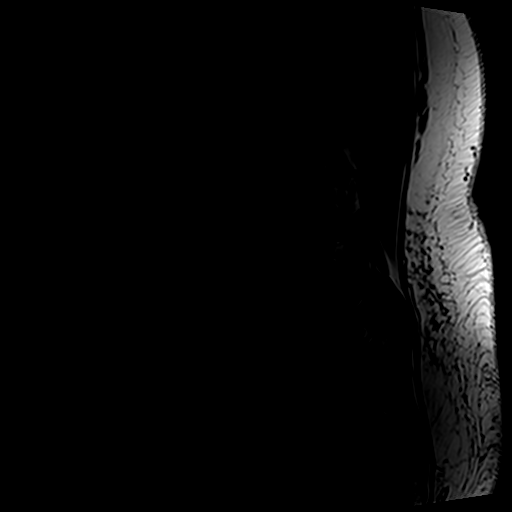
[im 15/15]
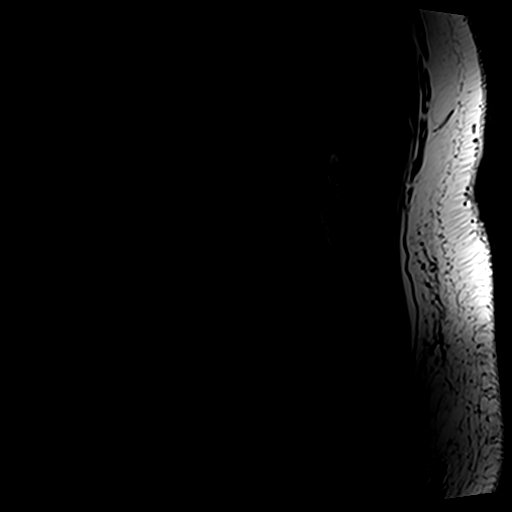

[Series 5: T1 · axial · 4.0mm · 0.35mm/px · z∈[-58,+116]mm · 4 of 37 slices shown (2 of 2)]
[im 1/37]
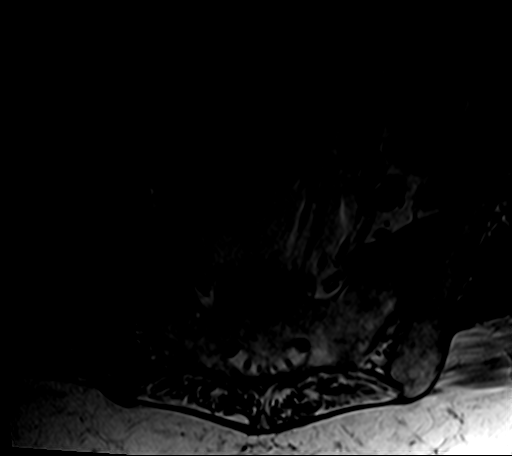
[im 6/37]
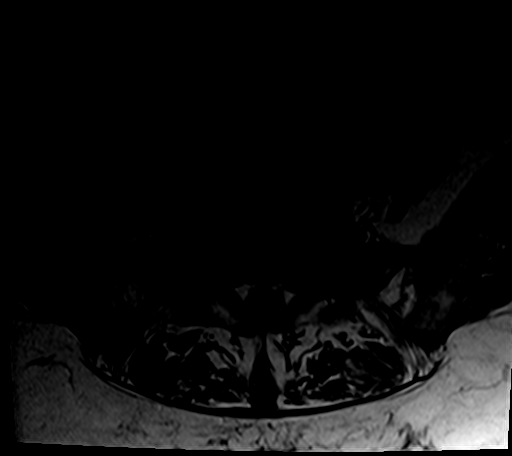
[im 19/37]
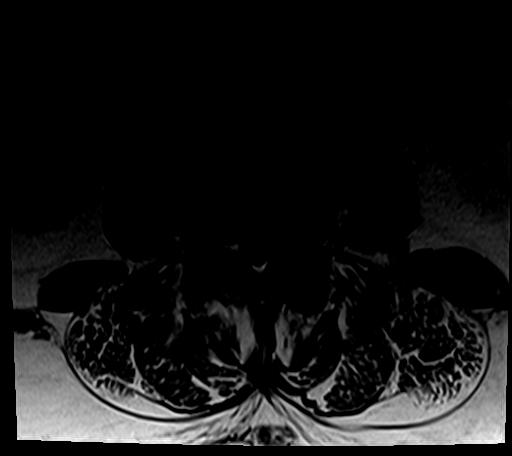
[im 31/37]
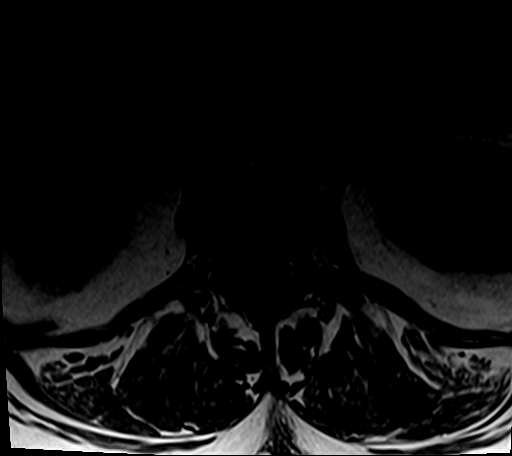

[Series 6: T2 · axial · 4.0mm · 0.70mm/px · z∈[-58,+147]mm · 9 of 37 slices shown]
[im 1/37]
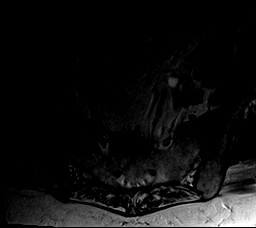
[im 6/37]
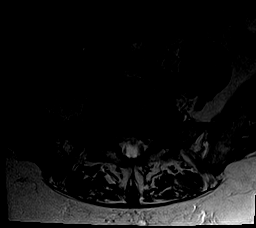
[im 11/37]
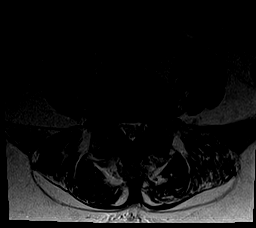
[im 16/37]
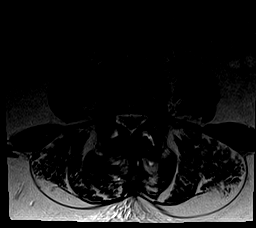
[im 19/37]
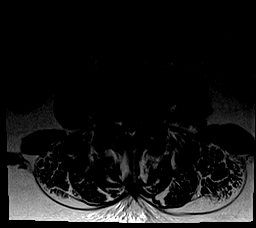
[im 21/37]
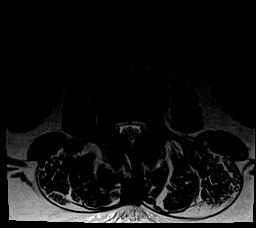
[im 26/37]
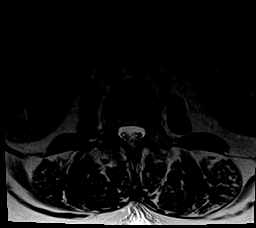
[im 31/37]
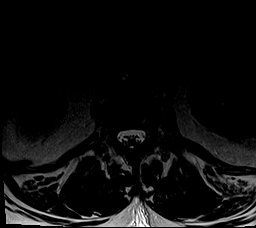
[im 37/37]
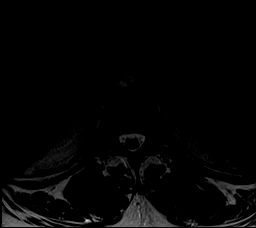

[25 of 48 positions shown; findings below may reference images not displayed]

FINDINGS: Segmentation:  Standard.

Alignment:  Physiologic.

Vertebrae:  No fracture, evidence of discitis, or bone lesion.

Conus medullaris and cauda equina: Conus extends to the L1 level.
Conus and cauda equina appear normal.

Paraspinal and other soft tissues: No acute paraspinal abnormality.

Disc levels:

Disc spaces: Degenerative disc disease with disc height loss at
L5-S1. Disc desiccation at L2-3, L3-4 and L4-5.

T11-12: Mild broad-based disc bulge. Moderate bilateral facet
arthropathy.

T12-L1: No significant disc bulge. No evidence of neural foraminal
stenosis. No central canal stenosis. Mild bilateral facet
arthropathy.

L1-L2: Minimal broad-based disc bulge. Moderate bilateral facet
arthropathy. No evidence of neural foraminal stenosis. No central
canal stenosis.

L2-L3: Broad-based disc bulge. Moderate bilateral facet arthropathy.
Mild spinal stenosis. Mild bilateral foraminal narrowing.

L3-L4: Broad-based disc bulge. Severe bilateral facet arthropathy
with facet effusions. Moderate-severe spinal stenosis. Bilateral
lateral recess stenosis. Moderate bilateral foraminal stenosis.

L4-L5: Broad-based disc bulge. Severe bilateral facet arthropathy
with ligamentum flavum infolding. Severe spinal stenosis and
bilateral lateral recess stenosis. Moderate bilateral foraminal
stenosis.

L5-S1: Broad-based disc osteophyte complex. Mild bilateral facet
arthropathy. Bilateral lateral recess stenosis. Severe right
foraminal stenosis. No left foraminal stenosis.
IMPRESSION: 1. At L3-4 there is a broad-based disc bulge. Severe bilateral facet
arthropathy with facet effusions. Moderate-severe spinal stenosis.
Bilateral lateral recess stenosis. Moderate bilateral foraminal
stenosis.

2. At L4-5 there is a broad-based disc bulge. Severe bilateral facet
arthropathy with ligamentum flavum infolding. Severe spinal stenosis
and bilateral lateral recess stenosis. Moderate bilateral foraminal
stenosis.

3. At L5-S1 there is a broad-based disc osteophyte complex. Mild
bilateral facet arthropathy. Bilateral lateral recess stenosis.
Severe right foraminal stenosis. No left foraminal stenosis.

## 2020-02-18 ENCOUNTER — Ambulatory Visit: Payer: Medicare PPO | Admitting: Family Medicine

## 2020-03-14 ENCOUNTER — Other Ambulatory Visit: Payer: Self-pay | Admitting: Family Medicine

## 2020-05-15 ENCOUNTER — Other Ambulatory Visit: Payer: Self-pay | Admitting: Family Medicine

## 2020-05-16 DIAGNOSIS — M48061 Spinal stenosis, lumbar region without neurogenic claudication: Secondary | ICD-10-CM | POA: Diagnosis not present

## 2020-05-24 ENCOUNTER — Ambulatory Visit: Payer: Medicare PPO | Admitting: Family Medicine

## 2020-05-31 ENCOUNTER — Encounter: Payer: Self-pay | Admitting: Family Medicine

## 2020-05-31 ENCOUNTER — Ambulatory Visit: Payer: Medicare PPO | Admitting: Family Medicine

## 2020-05-31 ENCOUNTER — Other Ambulatory Visit: Payer: Self-pay

## 2020-05-31 VITALS — BP 144/86 | HR 73 | Temp 98.2°F | Ht 67.0 in | Wt 259.2 lb

## 2020-05-31 DIAGNOSIS — I10 Essential (primary) hypertension: Secondary | ICD-10-CM

## 2020-05-31 DIAGNOSIS — E1165 Type 2 diabetes mellitus with hyperglycemia: Secondary | ICD-10-CM | POA: Diagnosis not present

## 2020-05-31 DIAGNOSIS — Z23 Encounter for immunization: Secondary | ICD-10-CM | POA: Diagnosis not present

## 2020-05-31 MED ORDER — CARVEDILOL 12.5 MG PO TABS
12.5000 mg | ORAL_TABLET | Freq: Two times a day (BID) | ORAL | 3 refills | Status: DC
Start: 2020-05-31 — End: 2020-11-01

## 2020-05-31 NOTE — Patient Instructions (Signed)
Give [place] 2-3 business days to get the results of your labs back.   Keep the diet clean and stay active.  Call your eye doctor for an appointment!  Let us know if you need anything.

## 2020-05-31 NOTE — Progress Notes (Signed)
Subjective:   Chief Complaint  Patient presents with   Follow-up    Sharon Yu is a 69 y.o. female here for follow-up of diabetes.   Sharon Yu does not check her sugars routinely.  Patient does not require insulin.   Medications include: metformin 500 mg/d Diet is OK.  Exercise: stretching  Hypertension Patient presents for hypertension follow up. She does monitor home blood pressures. Blood pressures ranging on average from 140's/80's. She is compliant with medications- Entresto, Coreg 6.25 mg/d. Patient has these side effects of medication: none Diet and exercise as above.   Past Medical History:  Diagnosis Date   Arthritis    Back pain    buldging disc   Chronic back pain    Hyperlipidemia    takes Crestor daily   Hypertension    takes Maxzide and Metoprolol daily along with Ramipril   Hypopotassemia    takes Potassium pill daily   Joint pain    Joint swelling    Nonischemic cardiomyopathy (HCC)    a. 07/2017: EF reduced to 25-30%, cath showing normal cors.    Shortness of breath    with exertion     Related testing: Retinal exam: Due Pneumovax: done  Objective:  BP (!) 144/86 (BP Location: Left Arm, Patient Position: Sitting, Cuff Size: Normal)    Pulse 73    Temp 98.2 F (36.8 C) (Oral)    Ht 5\' 7"  (1.702 m)    Wt 259 lb 4 oz (117.6 kg)    SpO2 97%    BMI 40.60 kg/m  General:  Well developed, well nourished, in no apparent distress Lungs:  CTAB, no access msc use Cardio:  RRR, no bruits, 1+ pitting b/l LE edema tapering at distal 1/3 of tibia Psych: Age appropriate judgment and insight  Assessment:   Type 2 diabetes mellitus with hyperglycemia, without long-term current use of insulin (HCC) - Plan: Hemoglobin A1c  Essential hypertension   Plan:   1. Stable pending A1c. She is diet controlled. Counseled on diet and exercise. She needs to reach out to her ophtho team. 2.  Increase Coreg from 6.25 mg to 12.5 mg twice daily.  Continue to  monitor blood pressures at home. F/u in 1 mo to recheck this. The patient voiced understanding and agreement to the plan.  [place], DO 05/31/20 11:10 AM

## 2020-06-01 LAB — HEMOGLOBIN A1C
Hgb A1c MFr Bld: 6 % of total Hgb — ABNORMAL HIGH (ref <5.7)
Mean Plasma Glucose: 126 (calc)
eAG (mmol/L): 7 (calc)

## 2020-06-13 DIAGNOSIS — M48061 Spinal stenosis, lumbar region without neurogenic claudication: Secondary | ICD-10-CM | POA: Diagnosis not present

## 2020-06-19 ENCOUNTER — Other Ambulatory Visit: Payer: Self-pay

## 2020-06-19 ENCOUNTER — Ambulatory Visit: Payer: Medicare PPO | Admitting: Internal Medicine

## 2020-06-19 ENCOUNTER — Encounter: Payer: Self-pay | Admitting: Internal Medicine

## 2020-06-19 VITALS — BP 157/89 | HR 84 | Ht 67.0 in | Wt 258.0 lb

## 2020-06-19 DIAGNOSIS — I712 Thoracic aortic aneurysm, without rupture: Secondary | ICD-10-CM

## 2020-06-19 DIAGNOSIS — I428 Other cardiomyopathies: Secondary | ICD-10-CM | POA: Diagnosis not present

## 2020-06-19 DIAGNOSIS — I10 Essential (primary) hypertension: Secondary | ICD-10-CM

## 2020-06-19 DIAGNOSIS — I5042 Chronic combined systolic (congestive) and diastolic (congestive) heart failure: Secondary | ICD-10-CM | POA: Diagnosis not present

## 2020-06-19 DIAGNOSIS — I7121 Aneurysm of the ascending aorta, without rupture: Secondary | ICD-10-CM

## 2020-06-19 MED ORDER — HYDRALAZINE HCL 25 MG PO TABS
25.0000 mg | ORAL_TABLET | Freq: Two times a day (BID) | ORAL | 3 refills | Status: DC
Start: 2020-06-19 — End: 2021-06-15

## 2020-06-19 NOTE — Patient Instructions (Signed)
Medication Instructions:  START hydralazine to 25mg  twice daily  CONTINUE all other current medications  *If you need a refill on your cardiac medications before your next appointment, please call your pharmacy*  Testing/Procedures: Limited Echocardiogram in January 2022 @ [address] Floor   Follow-Up: At February 2022, you and your health needs are our priority.  As part of our continuing mission to provide you with exceptional heart care, we have created designated Provider Care Teams.  These Care Teams include your primary Cardiologist (physician) and Advanced Practice Providers (APPs -  Physician Assistants and Nurse Practitioners) who all work together to provide you with the care you need, when you need it.  We recommend signing up for the patient portal called "MyChart".  Sign up information is provided on this After Visit Summary.  MyChart is used to connect with patients for Virtual Visits (Telemedicine).  Patients are able to view lab/test results, encounter notes, upcoming appointments, etc.  Non-urgent messages can be sent to your provider as well.   To learn more about what you can do with MyChart, go to [employer].    Your next appointment:   3 month(s) - after echo  The format for your next appointment:   In Person  Provider:   You may see [email or web address], MD or one of the following Advanced Practice Providers on your designated Care Team:    Chrystie Nose, PA-C  Azalee Course, PA-C or   Micah Flesher, Judy Pimple    Other Instructions

## 2020-06-19 NOTE — Progress Notes (Signed)
OFFICE NOTE  Chief Complaint:  Follow-up heart failure  Primary Care Physician: [name], DO  HPI:  Sharon Yu is a 69 y.o. female with a past medial history significant for hypertension, dyslipidemia, recent hospitalization for heart failure.  She was found to have a nonischemic cardiomyopathy with normal coronary arteries on cardiac catheterization in November 2018.  LVEF on echo was 25-30%.  She was started on carvedilol and losartan.  After discharge blood pressure remained elevated and her losartan was switched to olmesartan.  Carvedilol was then increased.  She reports she is doing well on the medications.  Initially blood pressure was elevated today however repeat came down to 124/78.  Weight is within a few pounds where it had been previously.  She denies any new or worsening edema.  She denies any chest pain.  She reports that she is going to start to exercise more with silver sneakers.  She attends the [employer] in [facility].  She was also found to have a 4.3 cm ascending aortic aneurysm.  This will need to be reimaged in approximately 1 year.  04/08/2018  Sharon Yu returns today for heart failure follow-up.  We recently performed a repeat echocardiogram, she indicates improvement in LVEF from 25 to 30% up to 40 to 45%.  She is tolerating the addition of carvedilol and is on losartan.  She also takes low-dose aspirin, atorvastatin and furosemide 40 mg daily.  She would does report symptomatic improvement and currently endorses NYHA class II symptoms.  Echo shows stability of her 4.3 cm ascending aortic aneurysm and we will recommend following this annually.  We may consider a CT scan of the aorta during the next study to more fully visualize the aortic arch and proximal descending aorta.  07/09/2018  Sharon Yu is seen today for follow-up heart failure.  Recently her LVEF improved from 25 to 30% up to 40 to 45%.  She is on moderate dose Entresto.  She endorses no  more than NYHA class II symptoms.  She was noted to have a 4.3 cm ascending aortic aneurysm which will need routine follow-up.  Her last echo was in July 2019.  She is had about 6 pound weight loss this year.  She is on moderate dose Lasix.  She seems to be tolerating medications and notes her blood pressure at home is around the 120s over low 70s.  I think there is room to increase her Entresto further.  She says it cost her about $80 for a 83-month supply which she said is affordable at this time and she is not receiving patient assistance because she did not qualify financially.  04/28/2019  Sharon Yu seen today for heart failure follow-up.  Overall she says she is feeling well.  She had a recent echo which showed a small interval improvement in LV function up to 45 to 50%.  She has had escalating doses of Entresto now on max dose 97/103 mg twice daily.  She is also on carvedilol 6.25 mg twice daily.  Overall she endorses NYHA class I-II symptoms.  EKG today shows sinus rhythm at 79.  11/03/2019  Sharon Yu returns today for follow-up.  Sadly she reports that her brother died today.  She says she was with family but because she did not want to miss the appointment came to the office.  She was interested in follow-up of her echo which shows a stable LVEF 45 to 50% but no significant improvement despite maximizing the  dose of her Entresto.  Blood pressure would not likely allow increases in her medication.  Heart rate was elevated today however she was rushed to get here and under stress.  He says in general her blood pressure is in the low 100s and heart rate in the 60s.  06/19/2020  Sharon Yu is seen today in follow-up.  She had an echo earlier this year in January 2021 which showed fairly stable LVEF of 45 to 50%.  This is a nonischemic cardiomyopathy with normal coronaries by cath in November 2018.  She is now on maximum dose Entresto of 97/103 mg twice daily.  She is also recently had an  increase in carvedilol to 12.5 mg twice daily by her PCP for persistently elevated blood pressure.  Today is 157/89 and in general she says it sits around 140/90.  This very well may be a hypertensive cardiomyopathy.  At present she has no more than class II heart failure symptoms.  Weight is still an issue with morbid obesity and a BMI of 40.  She says she does sleep well at night denies snoring but has never had sleep study.  EKG shows sinus rhythm.  Labs from March 2021 showed total cholesterol 111, HDL 43, LDL 45 and triglycerides 109.  PMHx:  Past Medical History:  Diagnosis Date  . Arthritis   . Back pain    buldging disc  . Chronic back pain   . Hyperlipidemia    takes Crestor daily  . Hypertension    takes Maxzide and Metoprolol daily along with Ramipril  . Hypopotassemia    takes Potassium pill daily  . Joint pain   . Joint swelling   . Nonischemic cardiomyopathy (HCC)    a. 07/2017: EF reduced to 25-30%, cath showing normal cors.   . Shortness of breath    with exertion    Past Surgical History:  Procedure Laterality Date  . bilateral knee arthroscopies    . carpel tunnel     bilateral  . CHOLECYSTECTOMY    . colonscopy    . JOINT REPLACEMENT Left   . RIGHT/LEFT HEART CATH AND CORONARY ANGIOGRAPHY N/A 07/17/2017   Procedure: RIGHT/LEFT HEART CATH AND CORONARY ANGIOGRAPHY;  Surgeon: [place], Peter M, MD;  Location: [facility] INVASIVE CV LAB;  Service: Cardiovascular;  Laterality: N/A;  . TOTAL KNEE ARTHROPLASTY Right 02/19/2013   Procedure: TOTAL KNEE ARTHROPLASTY;  Surgeon: Thera Flake., MD;  Location: [facility] OR;  Service: Orthopedics;  Laterality: Right;  right total knee arthroplasty  . TOTAL KNEE REVISION Left 07/02/2013   Procedure: LEFT TOTAL KNEE TIBIA REVISION;  Surgeon: Nestor Lewandowsky, MD;  Location: [facility] OR;  Service: Orthopedics;  Laterality: Left;    FAMHx:  Family History  Problem Relation Age of Onset  . Hypertension Mother   . Arthritis Mother   . Asthma Mother    . Diabetes Mother   . Hyperlipidemia Mother   . Cancer Mother        uterine cancer  . CAD Sister   . Heart failure Sister   . Hyperlipidemia Sister   . Heart disease Sister   . Early death Father   . Asthma Brother   . COPD Brother   . Depression Brother   . AAA (abdominal aortic aneurysm) Daughter   . Hearing loss Daughter   . Early death Daughter 34       Spinal miningitis    SOCHx:   reports that she has never smoked. She has  never used smokeless tobacco. She reports that she does not drink alcohol and does not use drugs.  ALLERGIES:  Allergies  Allergen Reactions  . Other Hives and Swelling    "Grapple" (hybrid apple & grape)    ROS: Pertinent items noted in HPI and remainder of comprehensive ROS otherwise negative.  HOME MEDS: Current Outpatient Medications on File Prior to Visit  Medication Sig Dispense Refill  . alendronate (FOSAMAX) 70 MG tablet Take 1 tablet (70 mg total) by mouth every 7 (seven) days. Saturdays. Take with a full glass of water on an empty stomach. 12 tablet 2  . aspirin 81 MG chewable tablet Chew 81 mg daily by mouth.    Marland Kitchen atorvastatin (LIPITOR) 40 MG tablet Take 1 tablet (40 mg total) by mouth daily. 90 tablet 3  . carvedilol (COREG) 12.5 MG tablet Take 1 tablet (12.5 mg total) by mouth 2 (two) times daily with a meal. 60 tablet 3  . ENTRESTO 97-103 MG Take 1 tablet by mouth twice daily 180 tablet 3  . furosemide (LASIX) 40 MG tablet Take 1 tablet by mouth once daily 90 tablet 0  . meloxicam (MOBIC) 15 MG tablet TAKE 1 TABLET BY MOUTH ONCE DAILY AS NEEDED FOR PAIN 30 tablet 0  . metFORMIN (GLUCOPHAGE) 500 MG tablet Take 1 tablet by mouth once daily with breakfast 90 tablet 0  . Multiple Vitamin (MULITIVITAMIN WITH MINERALS) TABS Take 1 tablet by mouth daily.    . Vitamin D3 (VITAMIN D) 25 MCG tablet Take 1,000 Units by mouth daily.    . vitamin E (VITAMIN E) 400 UNIT capsule Take 400 Units by mouth daily.     No current  facility-administered medications on file prior to visit.    LABS/IMAGING: No results found for this or any previous visit (from the past 48 hour(s)). No results found.  LIPID PANEL:    Component Value Date/Time   CHOL 111 11/24/2019 1132   TRIG 109.0 11/24/2019 1132   HDL 43.90 11/24/2019 1132   CHOLHDL 3 11/24/2019 1132   VLDL 21.8 11/24/2019 1132   LDLCALC 45 11/24/2019 1132     WEIGHTS: Wt Readings from Last 3 Encounters:  06/19/20 258 lb (117 kg)  05/31/20 259 lb 4 oz (117.6 kg)  11/24/19 259 lb (117.5 kg)    VITALS: BP (!) 157/89   Pulse 84   Ht 5\' 7"  (1.702 m)   Wt 258 lb (117 kg)   SpO2 98%   BMI 40.41 kg/m   EXAM: General appearance: alert, no distress and morbidly obese Neck: no carotid bruit, no JVD and thyroid not enlarged, symmetric, no tenderness/mass/nodules Lungs: clear to auscultation bilaterally Heart: regular rate and rhythm, S1, S2 normal, no murmur, click, rub or gallop Abdomen: soft, non-tender; bowel sounds normal; no masses,  no organomegaly Extremities: extremities normal, atraumatic, no cyanosis or edema Pulses: 2+ and symmetric Skin: Skin color, texture, turgor normal. No rashes or lesions Neurologic: Grossly normal Psych: Pleasant  EKG: Normal sinus rhythm 84, possible left atrial enlargement-personally reviewed  ASSESSMENT: 1. Chronic systolic congestive heart failure-NYHA class II symptoms, LVEF 45-50% (10/2019) 2. Nonischemic cardiomyopathy, normal coronary arteries by heart catheterization 07/2017 3. Hypertension 4. Dyslipidemia 5. Morbid obesity 6. 4.3 cm ascending aortic aneurysm - measured 3.8 by echo (09/2018 and normal in 09/2019)  PLAN: 1.   Sharon Yu continues to struggle with high blood pressure.  This may be driving her nonischemic cardiomyopathy.  She is on max dose Entresto and recently had an  increase in her carvedilol.  I would recommend adding hydralazine 25 mg twice daily as I think she is unlikely to tolerate 3  times daily dosing.  She has follow-up with her PCP in a few weeks.  I like to see her back in about 3 months at which time its been about a year since her last echo and will plan to repeat limited echo to reassess LV function.  She was noted on CT to have an ascending aortic aneurysm however this may have been missed measured.  She has had 2 echoes which did not demonstrate it in January 2020 and 2021.  We may wish to consider another CT to assess the entire aorta at some point soon.  Follow-up with me in 3 months.  Chrystie Nose, MD, [facility] FACP  [place]  [facility]  Medical Director of the [facility]  [facility] Diplomate of the American Board of Clinical Lipidology Attending Cardiologist  Direct Dial: [phone]  Fax: [phone]  Website:  [email or web address]   Lisette Abu Elior Robinette 06/19/2020, 2:11 PM

## 2020-06-20 ENCOUNTER — Other Ambulatory Visit: Payer: Self-pay | Admitting: Family Medicine

## 2020-06-30 ENCOUNTER — Ambulatory Visit: Payer: Medicare PPO | Admitting: Family Medicine

## 2020-07-12 ENCOUNTER — Ambulatory Visit: Payer: Medicare PPO | Admitting: Family Medicine

## 2020-07-19 ENCOUNTER — Other Ambulatory Visit: Payer: Self-pay

## 2020-07-19 ENCOUNTER — Encounter: Payer: Self-pay | Admitting: Family Medicine

## 2020-07-19 ENCOUNTER — Ambulatory Visit: Payer: Medicare PPO | Admitting: Family Medicine

## 2020-07-19 VITALS — BP 134/82 | HR 84 | Temp 98.6°F | Ht 66.0 in | Wt 255.0 lb

## 2020-07-19 DIAGNOSIS — G8929 Other chronic pain: Secondary | ICD-10-CM | POA: Diagnosis not present

## 2020-07-19 DIAGNOSIS — I10 Essential (primary) hypertension: Secondary | ICD-10-CM

## 2020-07-19 DIAGNOSIS — M545 Low back pain, unspecified: Secondary | ICD-10-CM | POA: Insufficient documentation

## 2020-07-19 NOTE — Progress Notes (Signed)
Chief Complaint  Patient presents with  . Follow-up    blood pressure    Subjective Sharon Yu is a 69 y.o. female who presents for hypertension follow up. She does not monitor home blood pressures. She is compliant with medications- Coreg 12.5 mg bid, hydralazine 25 mg bid, Entresto 97-103 bid. Patient has these side effects of medication: none She is sometimes adhering to a healthy diet overall. Current exercise: nothing  Patient has a history of chronic low back pain.  She follows with the orthopedic surgery team.  She has been to physical therapy in the past, but is not doing anything from a rehab standpoint currently.  She had an injection in early fall that has started to wear off.  She is taking meloxicam with some relief.  No neurologic signs or symptoms.  No loss of bowel or bladder function.   Past Medical History:  Diagnosis Date  . Arthritis   . Back pain    buldging disc  . Chronic back pain   . Hyperlipidemia    takes Crestor daily  . Hypertension    takes Maxzide and Metoprolol daily along with Ramipril  . Hypopotassemia    takes Potassium pill daily  . Joint pain   . Joint swelling   . Nonischemic cardiomyopathy (HCC)    a. 07/2017: EF reduced to 25-30%, cath showing normal cors.   . Shortness of breath    with exertion    Exam BP 134/82 (BP Location: Left Arm, Patient Position: Sitting, Cuff Size: Normal)   Pulse 84   Temp 98.6 F (37 C) (Oral)   Ht 5\' 6"  (1.676 m)   Wt 255 lb (115.7 kg)   SpO2 98%   BMI 41.16 kg/m  General:  well developed, well nourished, in no apparent distress Heart: RRR, no bruits Lungs: clear to auscultation, no accessory muscle use Psych: well oriented with normal range of affect and appropriate judgment/insight  Essential hypertension  Chronic bilateral low back pain without sciatica  1.  Continue Coreg 12.5 mg twice daily, hydralazine 25 mg twice daily, and Entresto 97-103 mg twice daily.  Monitor blood pressures  at home around 1-2 times per week through the next month.  If still at goal, can stop checking.  Counseled on diet and exercise. 2.  Stretches and exercises provided.  Declines  another referral or medicine. F/u in 5 months for a physical. The patient voiced understanding and agreement to the plan.  [place], DO 07/19/20  11:59 AM

## 2020-07-19 NOTE — Patient Instructions (Addendum)
Keep the diet clean and stay active.  Monitor your blood pressure at home for the next month around once per week. If consistently less than 140/90, OK to stop checking.  Heat (pad or rice pillow in microwave) over affected area, 10-15 minutes twice daily.   Ice/cold pack over area for 10-15 min twice daily.  Let us know if you need anything.  EXERCISES  RANGE OF MOTION (ROM) AND STRETCHING EXERCISES - Low Back Pain Most people with lower back pain will find that their symptoms get worse with excessive bending forward (flexion) or arching at the lower back (extension). The exercises that will help resolve your symptoms will focus on the opposite motion.  If you have pain, numbness or tingling which travels down into your buttocks, leg or foot, the goal of the therapy is for these symptoms to move closer to your back and eventually resolve. Sometimes, these leg symptoms will get better, but your lower back pain may worsen. This is often an indication of progress in your rehabilitation. Be very alert to any changes in your symptoms and the activities in which you participated in the 24 hours prior to the change. Sharing this information with your caregiver will allow him or her to most efficiently treat your condition. These exercises may help you when beginning to rehabilitate your injury. Your symptoms may resolve with or without further involvement from your physician, physical therapist or athletic trainer. While completing these exercises, remember:   Restoring tissue flexibility helps normal motion to return to the joints. This allows healthier, less painful movement and activity.  An effective stretch should be held for at least 30 seconds.  A stretch should never be painful. You should only feel a gentle lengthening or release in the stretched tissue. FLEXION RANGE OF MOTION AND STRETCHING EXERCISES:  STRETCH - Flexion, Single Knee to Chest   Lie on a firm bed or floor with both legs  extended in front of you.  Keeping one leg in contact with the floor, bring your opposite knee to your chest. Hold your leg in place by either grabbing behind your thigh or at your knee.  Pull until you feel a gentle stretch in your low back. Hold 30 seconds.  Slowly release your grasp and repeat the exercise with the opposite side. Repeat 2 times. Complete this exercise 3 times per week.   STRETCH - Flexion, Double Knee to Chest  Lie on a firm bed or floor with both legs extended in front of you.  Keeping one leg in contact with the floor, bring your opposite knee to your chest.  Tense your stomach muscles to support your back and then lift your other knee to your chest. Hold your legs in place by either grabbing behind your thighs or at your knees.  Pull both knees toward your chest until you feel a gentle stretch in your low back. Hold 30 seconds.  Tense your stomach muscles and slowly return one leg at a time to the floor. Repeat 2 times. Complete this exercise 3 times per week.   STRETCH - Low Trunk Rotation  Lie on a firm bed or floor. Keeping your legs in front of you, bend your knees so they are both pointed toward the ceiling and your feet are flat on the floor.  Extend your arms out to the side. This will stabilize your upper body by keeping your shoulders in contact with the floor.  Gently and slowly drop both knees together to one  side until you feel a gentle stretch in your low back. Hold for 30 seconds.  Tense your stomach muscles to support your lower back as you bring your knees back to the starting position. Repeat the exercise to the other side. Repeat 2 times. Complete this exercise at least 3 times per week.   EXTENSION RANGE OF MOTION AND FLEXIBILITY EXERCISES:  STRETCH - Extension, Prone on Elbows   Lie on your stomach on the floor, a bed will be too soft. Place your palms about shoulder width apart and at the height of your head.  Place your elbows under  your shoulders. If this is too painful, stack pillows under your chest.  Allow your body to relax so that your hips drop lower and make contact more completely with the floor.  Hold this position for 30 seconds.  Slowly return to lying flat on the floor. Repeat 2 times. Complete this exercise 3 times per week.   RANGE OF MOTION - Extension, Prone Press Ups  Lie on your stomach on the floor, a bed will be too soft. Place your palms about shoulder width apart and at the height of your head.  Keeping your back as relaxed as possible, slowly straighten your elbows while keeping your hips on the floor. You may adjust the placement of your hands to maximize your comfort. As you gain motion, your hands will come more underneath your shoulders.  Hold this position 30 seconds.  Slowly return to lying flat on the floor. Repeat 2 times. Complete this exercise 3 times per week.   RANGE OF MOTION- Quadruped, Neutral Spine   Assume a hands and knees position on a firm surface. Keep your hands under your shoulders and your knees under your hips. You may place padding under your knees for comfort.  Drop your head and point your tailbone toward the ground below you. This will round out your lower back like an angry cat. Hold this position for 30 seconds.  Slowly lift your head and release your tail bone so that your back sags into a large arch, like an old horse.  Hold this position for 30 seconds.  Repeat this until you feel limber in your low back.  Now, find your "sweet spot." This will be the most comfortable position somewhere between the two previous positions. This is your neutral spine. Once you have found this position, tense your stomach muscles to support your low back.  Hold this position for 30 seconds. Repeat 2 times. Complete this exercise 3 times per week.   STRENGTHENING EXERCISES - Low Back Sprain These exercises may help you when beginning to rehabilitate your injury. These  exercises should be done near your "sweet spot." This is the neutral, low-back arch, somewhere between fully rounded and fully arched, that is your least painful position. When performed in this safe range of motion, these exercises can be used for people who have either a flexion or extension based injury. These exercises may resolve your symptoms with or without further involvement from your physician, physical therapist or athletic trainer. While completing these exercises, remember:   Muscles can gain both the endurance and the strength needed for everyday activities through controlled exercises.  Complete these exercises as instructed by your physician, physical therapist or athletic trainer. Increase the resistance and repetitions only as guided.  You may experience muscle soreness or fatigue, but the pain or discomfort you are trying to eliminate should never worsen during these exercises. If this pain  does worsen, stop and make certain you are following the directions exactly. If the pain is still present after adjustments, discontinue the exercise until you can discuss the trouble with your caregiver.  STRENGTHENING - Deep Abdominals, Pelvic Tilt   Lie on a firm bed or floor. Keeping your legs in front of you, bend your knees so they are both pointed toward the ceiling and your feet are flat on the floor.  Tense your lower abdominal muscles to press your low back into the floor. This motion will rotate your pelvis so that your tail bone is scooping upwards rather than pointing at your feet or into the floor. With a gentle tension and even breathing, hold this position for 3 seconds. Repeat 2 times. Complete this exercise 3 times per week.   STRENGTHENING - Abdominals, Crunches   Lie on a firm bed or floor. Keeping your legs in front of you, bend your knees so they are both pointed toward the ceiling and your feet are flat on the floor. Cross your arms over your chest.  Slightly tip your  chin down without bending your neck.  Tense your abdominals and slowly lift your trunk high enough to just clear your shoulder blades. Lifting higher can put excessive stress on the lower back and does not further strengthen your abdominal muscles.  Control your return to the starting position. Repeat 2 times. Complete this exercise 3 times per week.   STRENGTHENING - Quadruped, Opposite UE/LE Lift   Assume a hands and knees position on a firm surface. Keep your hands under your shoulders and your knees under your hips. You may place padding under your knees for comfort.  Find your neutral spine and gently tense your abdominal muscles so that you can maintain this position. Your shoulders and hips should form a rectangle that is parallel with the floor and is not twisted.  Keeping your trunk steady, lift your right hand no higher than your shoulder and then your left leg no higher than your hip. Make sure you are not holding your breath. Hold this position for 30 seconds.  Continuing to keep your abdominal muscles tense and your back steady, slowly return to your starting position. Repeat with the opposite arm and leg. Repeat 2 times. Complete this exercise 3 times per week.   STRENGTHENING - Abdominals and Quadriceps, Straight Leg Raise   Lie on a firm bed or floor with both legs extended in front of you.  Keeping one leg in contact with the floor, bend the other knee so that your foot can rest flat on the floor.  Find your neutral spine, and tense your abdominal muscles to maintain your spinal position throughout the exercise.  Slowly lift your straight leg off the floor about 6 inches for a count of 3, making sure to not hold your breath.  Still keeping your neutral spine, slowly lower your leg all the way to the floor. Repeat this exercise with each leg 2 times. Complete this exercise 3 times per week.  POSTURE AND BODY MECHANICS CONSIDERATIONS - Low Back Sprain Keeping correct  posture when sitting, standing or completing your activities will reduce the stress put on different body tissues, allowing injured tissues a chance to heal and limiting painful experiences. The following are general guidelines for improved posture.  While reading these guidelines, remember:  The exercises prescribed by your provider will help you have the flexibility and strength to maintain correct postures.  The correct posture provides the best  environment for your joints to work. All of your joints have less wear and tear when properly supported by a spine with good posture. This means you will experience a healthier, less painful body.  Correct posture must be practiced with all of your activities, especially prolonged sitting and standing. Correct posture is as important when doing repetitive low-stress activities (typing) as it is when doing a single heavy-load activity (lifting).  RESTING POSITIONS Consider which positions are most painful for you when choosing a resting position. If you have pain with flexion-based activities (sitting, bending, stooping, squatting), choose a position that allows you to rest in a less flexed posture. You would want to avoid curling into a fetal position on your side. If your pain worsens with extension-based activities (prolonged standing, working overhead), avoid resting in an extended position such as sleeping on your stomach. Most people will find more comfort when they rest with their spine in a more neutral position, neither too rounded nor too arched. Lying on a non-sagging bed on your side with a pillow between your knees, or on your back with a pillow under your knees will often provide some relief. Keep in mind, being in any one position for a prolonged period of time, no matter how correct your posture, can still lead to stiffness.  PROPER SITTING POSTURE In order to minimize stress and discomfort on your spine, you must sit with correct posture.  Sitting with good posture should be effortless for a healthy body. Returning to good posture is a gradual process. Many people can work toward this most comfortably by using various supports until they have the flexibility and strength to maintain this posture on their own. When sitting with proper posture, your ears will fall over your shoulders and your shoulders will fall over your hips. You should use the back of the chair to support your upper back. Your lower back will be in a neutral position, just slightly arched. You may place a small pillow or folded towel at the base of your lower back for  support.  When working at a desk, create an environment that supports good, upright posture. Without extra support, muscles tire, which leads to excessive strain on joints and other tissues. Keep these recommendations in mind:  CHAIR:  A chair should be able to slide under your desk when your back makes contact with the back of the chair. This allows you to work closely.  The chair's height should allow your eyes to be level with the upper part of your monitor and your hands to be slightly lower than your elbows.  BODY POSITION  Your feet should make contact with the floor. If this is not possible, use a foot rest.  Keep your ears over your shoulders. This will reduce stress on your neck and low back.  INCORRECT SITTING POSTURES  If you are feeling tired and unable to assume a healthy sitting posture, do not slouch or slump. This puts excessive strain on your back tissues, causing more damage and pain. Healthier options include:  Using more support, like a lumbar pillow.  Switching tasks to something that requires you to be upright or walking.  Talking a brief walk.  Lying down to rest in a neutral-spine position.  PROLONGED STANDING WHILE SLIGHTLY LEANING FORWARD  When completing a task that requires you to lean forward while standing in one place for a long time, place either foot up on a  stationary 2-4 inch high object to help maintain  the best posture. When both feet are on the ground, the lower back tends to lose its slight inward curve. If this curve flattens (or becomes too large), then the back and your other joints will experience too much stress, tire more quickly, and can cause pain.  CORRECT STANDING POSTURES Proper standing posture should be assumed with all daily activities, even if they only take a few moments, like when brushing your teeth. As in sitting, your ears should fall over your shoulders and your shoulders should fall over your hips. You should keep a slight tension in your abdominal muscles to brace your spine. Your tailbone should point down to the ground, not behind your body, resulting in an over-extended swayback posture.   INCORRECT STANDING POSTURES  Common incorrect standing postures include a forward head, locked knees and/or an excessive swayback. WALKING Walk with an upright posture. Your ears, shoulders and hips should all line-up.  PROLONGED ACTIVITY IN A FLEXED POSITION When completing a task that requires you to bend forward at your waist or lean over a low surface, try to find a way to stabilize 3 out of 4 of your limbs. You can place a hand or elbow on your thigh or rest a knee on the surface you are reaching across. This will provide you more stability, so that your muscles do not tire as quickly. By keeping your knees relaxed, or slightly bent, you will also reduce stress across your lower back. CORRECT LIFTING TECHNIQUES  DO :  Assume a wide stance. This will provide you more stability and the opportunity to get as close as possible to the object which you are lifting.  Tense your abdominals to brace your spine. Bend at the knees and hips. Keeping your back locked in a neutral-spine position, lift using your leg muscles. Lift with your legs, keeping your back straight.  Test the weight of unknown objects before attempting to lift  them.  Try to keep your elbows locked down at your sides in order get the best strength from your shoulders when carrying an object.     Always ask for help when lifting heavy or awkward objects. INCORRECT LIFTING TECHNIQUES DO NOT:   Lock your knees when lifting, even if it is a small object.  Bend and twist. Pivot at your feet or move your feet when needing to change directions.  Assume that you can safely pick up even a paperclip without proper posture.

## 2020-09-12 ENCOUNTER — Other Ambulatory Visit: Payer: Self-pay | Admitting: Family Medicine

## 2020-09-18 ENCOUNTER — Other Ambulatory Visit (HOSPITAL_COMMUNITY): Payer: Medicare PPO

## 2020-09-20 ENCOUNTER — Other Ambulatory Visit: Payer: Self-pay | Admitting: Internal Medicine

## 2020-09-22 ENCOUNTER — Other Ambulatory Visit: Payer: Self-pay | Admitting: Family Medicine

## 2020-09-26 ENCOUNTER — Ambulatory Visit: Payer: Medicare PPO | Admitting: Internal Medicine

## 2020-10-04 ENCOUNTER — Other Ambulatory Visit: Payer: Self-pay

## 2020-10-04 ENCOUNTER — Ambulatory Visit (HOSPITAL_COMMUNITY): Payer: Medicare PPO | Attending: Cardiology

## 2020-10-04 DIAGNOSIS — I428 Other cardiomyopathies: Secondary | ICD-10-CM | POA: Diagnosis not present

## 2020-10-04 LAB — ECHOCARDIOGRAM LIMITED
Area-P 1/2: 3.87 cm2
S' Lateral: 3.7 cm

## 2020-10-04 MED ORDER — PERFLUTREN LIPID MICROSPHERE
1.0000 mL | INTRAVENOUS | Status: AC | PRN
  Administered 2020-10-04: 3 mL via INTRAVENOUS

## 2020-10-21 ENCOUNTER — Other Ambulatory Visit: Payer: Self-pay | Admitting: Family Medicine

## 2020-10-31 DIAGNOSIS — E119 Type 2 diabetes mellitus without complications: Secondary | ICD-10-CM | POA: Diagnosis not present

## 2020-10-31 LAB — HM DIABETES EYE EXAM

## 2020-11-01 ENCOUNTER — Other Ambulatory Visit: Payer: Self-pay | Admitting: Family Medicine

## 2020-11-16 ENCOUNTER — Encounter: Payer: Self-pay | Admitting: Internal Medicine

## 2020-11-16 ENCOUNTER — Other Ambulatory Visit: Payer: Self-pay

## 2020-11-16 ENCOUNTER — Ambulatory Visit (INDEPENDENT_AMBULATORY_CARE_PROVIDER_SITE_OTHER): Payer: Medicare PPO | Admitting: Internal Medicine

## 2020-11-16 VITALS — BP 161/93 | HR 85 | Ht 67.0 in | Wt 257.6 lb

## 2020-11-16 DIAGNOSIS — I428 Other cardiomyopathies: Secondary | ICD-10-CM | POA: Diagnosis not present

## 2020-11-16 DIAGNOSIS — I10 Essential (primary) hypertension: Secondary | ICD-10-CM | POA: Diagnosis not present

## 2020-11-16 DIAGNOSIS — E785 Hyperlipidemia, unspecified: Secondary | ICD-10-CM | POA: Diagnosis not present

## 2020-11-16 DIAGNOSIS — E1165 Type 2 diabetes mellitus with hyperglycemia: Secondary | ICD-10-CM

## 2020-11-16 MED ORDER — EMPAGLIFLOZIN 10 MG PO TABS
10.0000 mg | ORAL_TABLET | Freq: Every day | ORAL | 11 refills | Status: DC
Start: 2020-11-16 — End: 2020-12-13

## 2020-11-16 MED ORDER — CARVEDILOL 25 MG PO TABS: 25.0000 mg | ORAL_TABLET | Freq: Two times a day (BID) | ORAL | 3 refills | Status: DC

## 2020-11-16 NOTE — Patient Instructions (Addendum)
Medication Instructions:  INCREASE carvedilol to 25mg  twice daily START jardiance 10mg  daily -- once approved with insurance  *If you need a refill on your cardiac medications before your next appointment, please call your pharmacy*   Lab Work: BMET - non-fasting lab to be completed 2 weeks after starting Jardiance  A1c & lipid panel - fasting - to be completed about 1 week before your next visit  If you have labs (blood work) drawn today and your tests are completely normal, you will receive your results only by: MyChart Message (if you have MyChart) OR . A paper copy in the mail If you have any lab test that is abnormal or we need to change your treatment, we will call you to review the results.   Testing/Procedures: NONE   Follow-Up: At [facility], you and your health needs are our priority.  As part of our continuing mission to provide you with exceptional heart care, we have created designated Provider Care Teams.  These Care Teams include your primary Cardiologist (physician) and Advanced Practice Providers (APPs -  Physician Assistants and Nurse Practitioners) who all work together to provide you with the care you need, when you need it.  We recommend signing up for the patient portal called "MyChart".  Sign up information is provided on this After Visit Summary.  MyChart is used to connect with patients for Virtual Visits (Telemedicine).  Patients are able to view lab/test results, encounter notes, upcoming appointments, etc.  Non-urgent messages can be sent to your provider as well.   To learn more about what you can do with MyChart, go to [employer] [employer].    Your next appointment:   3 month(s)  The format for your next appointment:   In Person  Provider:   You may see [facility], MD or one of the following Advanced Practice Providers on your designated Care Team:    [email or web address], PA-C  Chrystie Nose, PA-C or   Azalee Course, Micah Flesher    Other  Instructions

## 2020-11-16 NOTE — Progress Notes (Signed)
OFFICE NOTE  Chief Complaint:  Follow-up heart failure  Primary Care Physician: [name], DO  HPI:  Sharon Yu is a 70 y.o. female with a past medial history significant for hypertension, dyslipidemia, recent hospitalization for heart failure.  She was found to have a nonischemic cardiomyopathy with normal coronary arteries on cardiac catheterization in November 2018.  LVEF on echo was 25-30%.  She was started on carvedilol and losartan.  After discharge blood pressure remained elevated and her losartan was switched to olmesartan.  Carvedilol was then increased.  She reports she is doing well on the medications.  Initially blood pressure was elevated today however repeat came down to 124/78.  Weight is within a few pounds where it had been previously.  She denies any new or worsening edema.  She denies any chest pain.  She reports that she is going to start to exercise more with silver sneakers.  She attends the [facility] in [facility].  She was also found to have a 4.3 cm ascending aortic aneurysm.  This will need to be reimaged in approximately 1 year.  04/08/2018  Mrs. Cervantes returns today for heart failure follow-up.  We recently performed a repeat echocardiogram, she indicates improvement in LVEF from 25 to 30% up to 40 to 45%.  She is tolerating the addition of carvedilol and is on losartan.  She also takes low-dose aspirin, atorvastatin and furosemide 40 mg daily.  She would does report symptomatic improvement and currently endorses NYHA class II symptoms.  Echo shows stability of her 4.3 cm ascending aortic aneurysm and we will recommend following this annually.  We may consider a CT scan of the aorta during the next study to more fully visualize the aortic arch and proximal descending aorta.  07/09/2018  Mrs. Barrell is seen today for follow-up heart failure.  Recently her LVEF improved from 25 to 30% up to 40 to 45%.  She is on moderate dose Entresto.  She endorses no  more than NYHA class II symptoms.  She was noted to have a 4.3 cm ascending aortic aneurysm which will need routine follow-up.  Her last echo was in July 2019.  She is had about 6 pound weight loss this year.  She is on moderate dose Lasix.  She seems to be tolerating medications and notes her blood pressure at home is around the 120s over low 70s.  I think there is room to increase her Entresto further.  She says it cost her about $80 for a 68-month supply which she said is affordable at this time and she is not receiving patient assistance because she did not qualify financially.  04/28/2019  Mrs. Aplin seen today for heart failure follow-up.  Overall she says she is feeling well.  She had a recent echo which showed a small interval improvement in LV function up to 45 to 50%.  She has had escalating doses of Entresto now on max dose 97/103 mg twice daily.  She is also on carvedilol 6.25 mg twice daily.  Overall she endorses NYHA class I-II symptoms.  EKG today shows sinus rhythm at 79.  11/03/2019  Mrs. Madani returns today for follow-up.  Sadly she reports that her brother died today.  She says she was with family but because she did not want to miss the appointment came to the office.  She was interested in follow-up of her echo which shows a stable LVEF 45 to 50% but no significant improvement despite maximizing the  dose of her Entresto.  Blood pressure would not likely allow increases in her medication.  Heart rate was elevated today however she was rushed to get here and under stress.  He says in general her blood pressure is in the low 100s and heart rate in the 60s.  06/19/2020  Mrs. Gelardi is seen today in follow-up.  She had an echo earlier this year in January 2021 which showed fairly stable LVEF of 45 to 50%.  This is a nonischemic cardiomyopathy with normal coronaries by cath in November 2018.  She is now on maximum dose Entresto of 97/103 mg twice daily.  She is also recently had an  increase in carvedilol to 12.5 mg twice daily by her PCP for persistently elevated blood pressure.  Today is 157/89 and in general she says it sits around 140/90.  This very well may be a hypertensive cardiomyopathy.  At present she has no more than class II heart failure symptoms.  Weight is still an issue with morbid obesity and a BMI of 40.  She says she does sleep well at night denies snoring but has never had sleep study.  EKG shows sinus rhythm.  Labs from March 2021 showed total cholesterol 111, HDL 43, LDL 45 and triglycerides 109.  11/16/2020  Mrs. Dettmann is seen today for follow-up.  Overall she is doing well.  Blood pressure was again elevated today 161/93.  She says at home it is lower however typically not better than 140 systolic.  We had repeated an echo recently and unfortunately despite maximal treatment with Entresto her LVEF is slightly lower at 40 to 45%.  This may be related to interpretation however I feel that with her blood pressure still poorly controlled that that is contributing to this.  Overall she has likely NYHA class II symptoms.  PMHx:  Past Medical History:  Diagnosis Date  . Arthritis   . Back pain    buldging disc  . Chronic back pain   . Hyperlipidemia    takes Crestor daily  . Hypertension    takes Maxzide and Metoprolol daily along with Ramipril  . Hypopotassemia    takes Potassium pill daily  . Joint pain   . Joint swelling   . Nonischemic cardiomyopathy (HCC)    a. 07/2017: EF reduced to 25-30%, cath showing normal cors.   . Shortness of breath    with exertion    Past Surgical History:  Procedure Laterality Date  . bilateral knee arthroscopies    . carpel tunnel     bilateral  . CHOLECYSTECTOMY    . colonscopy    . JOINT REPLACEMENT Left   . RIGHT/LEFT HEART CATH AND CORONARY ANGIOGRAPHY N/A 07/17/2017   Procedure: RIGHT/LEFT HEART CATH AND CORONARY ANGIOGRAPHY;  Surgeon: Swaziland, Peter M, MD;  Location: [facility] INVASIVE CV LAB;  Service:  Cardiovascular;  Laterality: N/A;  . TOTAL KNEE ARTHROPLASTY Right 02/19/2013   Procedure: TOTAL KNEE ARTHROPLASTY;  Surgeon: Thera Flake., MD;  Location: [facility] OR;  Service: Orthopedics;  Laterality: Right;  right total knee arthroplasty  . TOTAL KNEE REVISION Left 07/02/2013   Procedure: LEFT TOTAL KNEE TIBIA REVISION;  Surgeon: Nestor Lewandowsky, MD;  Location: [facility] OR;  Service: Orthopedics;  Laterality: Left;    FAMHx:  Family History  Problem Relation Age of Onset  . Hypertension Mother   . Arthritis Mother   . Asthma Mother   . Diabetes Mother   . Hyperlipidemia Mother   . Cancer  Mother        uterine cancer  . CAD Sister   . Heart failure Sister   . Hyperlipidemia Sister   . Heart disease Sister   . Early death Father   . Asthma Brother   . COPD Brother   . Depression Brother   . AAA (abdominal aortic aneurysm) Daughter   . Hearing loss Daughter   . Early death Daughter 15       Spinal miningitis    SOCHx:   reports that she has never smoked. She has never used smokeless tobacco. She reports that she does not drink alcohol and does not use drugs.  ALLERGIES:  Allergies  Allergen Reactions  . Other Hives and Swelling    "Grapple" (hybrid apple & grape)    ROS: Pertinent items noted in HPI and remainder of comprehensive ROS otherwise negative.  HOME MEDS: Current Outpatient Medications on File Prior to Visit  Medication Sig Dispense Refill  . alendronate (FOSAMAX) 70 MG tablet Take 1 tablet (70 mg total) by mouth every 7 (seven) days. Saturdays. Take with a full glass of water on an empty stomach. 12 tablet 2  . aspirin 81 MG chewable tablet Chew 81 mg daily by mouth.    Marland Kitchen atorvastatin (LIPITOR) 40 MG tablet Take 1 tablet (40 mg total) by mouth daily. 90 tablet 0  . carvedilol (COREG) 12.5 MG tablet TAKE 1 TABLET BY MOUTH TWICE DAILY WITH A MEAL. 180 tablet 0  . ENTRESTO 97-103 MG Take 1 tablet by mouth twice daily 180 tablet 0  . furosemide (LASIX) 40 MG tablet  Take 1 tablet by mouth once daily 90 tablet 0  . meloxicam (MOBIC) 15 MG tablet Take 1 tablet (15 mg total) by mouth daily as needed for pain. 30 tablet 1  . metFORMIN (GLUCOPHAGE) 500 MG tablet Take 1 tablet by mouth once daily with breakfast 90 tablet 0  . Multiple Vitamin (MULITIVITAMIN WITH MINERALS) TABS Take 1 tablet by mouth daily.    . Potassium (POTASSIMIN PO) Take by mouth.    . Vitamin D3 (VITAMIN D) 25 MCG tablet Take 1,000 Units by mouth daily.    . vitamin E 180 MG (400 UNITS) capsule Take 400 Units by mouth daily.    . hydrALAZINE (APRESOLINE) 25 MG tablet Take 1 tablet (25 mg total) by mouth in the morning and at bedtime. 180 tablet 3   No current facility-administered medications on file prior to visit.    LABS/IMAGING: No results found for this or any previous visit (from the past 48 hour(s)). No results found.  LIPID PANEL:    Component Value Date/Time   CHOL 111 11/24/2019 1132   TRIG 109.0 11/24/2019 1132   HDL 43.90 11/24/2019 1132   CHOLHDL 3 11/24/2019 1132   VLDL 21.8 11/24/2019 1132   LDLCALC 45 11/24/2019 1132     WEIGHTS: Wt Readings from Last 3 Encounters:  11/16/20 257 lb 9.6 oz (116.8 kg)  07/19/20 255 lb (115.7 kg)  06/19/20 258 lb (117 kg)    VITALS: BP (!) 161/93   Pulse 85   Ht 5\' 7"  (1.702 m)   Wt 257 lb 9.6 oz (116.8 kg)   SpO2 100%   BMI 40.35 kg/m   EXAM: General appearance: alert, no distress and morbidly obese Neck: no carotid bruit, no JVD and thyroid not enlarged, symmetric, no tenderness/mass/nodules Lungs: clear to auscultation bilaterally Heart: regular rate and rhythm, S1, S2 normal, no murmur, click, rub or gallop Abdomen:  soft, non-tender; bowel sounds normal; no masses,  no organomegaly Extremities: extremities normal, atraumatic, no cyanosis or edema Pulses: 2+ and symmetric Skin: Skin color, texture, turgor normal. No rashes or lesions Neurologic: Grossly normal Psych: Pleasant  EKG: Normal sinus rhythm 85,  possible left atrial enlargement-personally reviewed  ASSESSMENT: 1. Chronic systolic congestive heart failure-NYHA class II symptoms, LVEF 45-50% (10/2019) 2. Nonischemic cardiomyopathy, normal coronary arteries by heart catheterization 07/2017 3. Hypertension 4. Dyslipidemia 5. Morbid obesity 6. 4.3 cm ascending aortic aneurysm - measured 3.8 by echo (09/2018 and normal in 09/2019) 7. Borderline diabetes-A1c 6.0  PLAN: 1.   Mrs. Krakowski has had some further decline in LVEF down to 40 to 45% despite maximizing her Entresto.  I recommend increasing her carvedilol further to 25 mg twice daily as her blood pressure still elevated heart rate is in the 80s.  She is compliant with her medications.  In addition she is on hydralazine 25 twice daily and Entresto 97/103 mg twice daily.  I would also recommend adding an SGLT2 inhibitor-we will start Jardiance 10 mg daily.  Over this will help with some weight loss and her borderline glucose elevation as well as indication for heart failure.  Repeat a metabolic profile in a couple weeks and then recheck an A1c and fasting lipid profile in about 3 months with follow-up at that time.  Chrystie Nose, MD, [facility], FACP  [place]  [facility]  Medical Director of the [facility]  [facility] Diplomate of the American Board of Clinical Lipidology Attending Cardiologist  Direct Dial: [phone]  Fax: [phone]  Website:  [email or web address] Nicely Atziri Zubiate 11/16/2020, 11:55 AM

## 2020-11-22 ENCOUNTER — Other Ambulatory Visit (HOSPITAL_BASED_OUTPATIENT_CLINIC_OR_DEPARTMENT_OTHER): Payer: Self-pay | Admitting: Family Medicine

## 2020-11-22 ENCOUNTER — Telehealth: Payer: Self-pay

## 2020-11-22 DIAGNOSIS — Z1231 Encounter for screening mammogram for malignant neoplasm of breast: Secondary | ICD-10-CM

## 2020-11-22 NOTE — Telephone Encounter (Signed)
Called the patient to inform she is seeing PCP on 12/18/20 Cardiologist on 02/20/2021 Will reschedule 12/18/09 appt to another day. Rescheduled for 01/10/21.

## 2020-11-22 NOTE — Telephone Encounter (Signed)
Caller states she has appt for 4/18 with Cardiologist. She needs to verify if it is 4/11 or 4/18.

## 2020-12-11 ENCOUNTER — Telehealth (INDEPENDENT_AMBULATORY_CARE_PROVIDER_SITE_OTHER): Payer: Medicare PPO | Admitting: Family Medicine

## 2020-12-11 ENCOUNTER — Encounter: Payer: Self-pay | Admitting: Family Medicine

## 2020-12-11 ENCOUNTER — Other Ambulatory Visit: Payer: Self-pay

## 2020-12-11 ENCOUNTER — Telehealth: Payer: Self-pay

## 2020-12-11 DIAGNOSIS — J40 Bronchitis, not specified as acute or chronic: Secondary | ICD-10-CM | POA: Diagnosis not present

## 2020-12-11 MED ORDER — PREDNISONE 20 MG PO TABS: 40.0000 mg | ORAL_TABLET | Freq: Every day | ORAL | 0 refills | Status: AC

## 2020-12-11 MED ORDER — BENZONATATE 100 MG PO CAPS: 100.0000 mg | ORAL_CAPSULE | Freq: Three times a day (TID) | ORAL | 0 refills | Status: DC | PRN

## 2020-12-11 NOTE — Progress Notes (Signed)
Chief Complaint  Patient presents with  . Cough  . Shortness of Breath    Sharon Yu here for URI complaints. Due to COVID-19 pandemic, we are interacting via telephone. I verified patient's ID using 2 identifiers. Patient agreed to proceed with visit via this method. Patient is at home, I am at office. Patient and I are present for visit.   Duration: 4 days  Associated symptoms: sinus congestion, rhinorrhea, ear fullness, wheezing, shortness of breath and cough Denies: sinus pain, itchy watery eyes, ear pain, ear drainage, sore throat, myalgia and fevers, N/V/D Treatment to date: drinking hot liquids, used a humidifier, dextromethorphan Sick contacts: No  Past Medical History:  Diagnosis Date  . Arthritis   . Back pain    buldging disc  . Chronic back pain   . Hyperlipidemia    takes Crestor daily  . Hypertension    takes Maxzide and Metoprolol daily along with Ramipril  . Hypopotassemia    takes Potassium pill daily  . Joint pain   . Joint swelling   . Nonischemic cardiomyopathy (HCC)    a. 07/2017: EF reduced to 25-30%, cath showing normal cors.   . Shortness of breath    with exertion   Objective No conversational dyspnea Age appropriate judgment and insight Nml affect and mood  Wheezy bronchitis - Plan: predniSONE (DELTASONE) 20 MG tablet, benzonatate (TESSALON) 100 MG capsule  Continue to push fluids, practice good hand hygiene, cover mouth when coughing. F/u prn. If starting to experience fevers, shaking, or shortness of breath, seek immediate care. Total time: 11 min Pt voiced understanding and agreement to the plan.  [name], DO 12/11/20 10:30 AM

## 2020-12-11 NOTE — Telephone Encounter (Signed)
  Nurse Assessment Nurse: Richrd Prime, RN, Clarice Date/Time (Eastern Time): 12/10/2020 9:16:57 AM Confirm and document reason for call. If symptomatic, describe symptoms. ---Caller states she has a cough that is productive and hoarse since Thursday. Reports the phlegm started out green, then yellow and then clear. Denies fever. Does the patient have any new or worsening symptoms? ---Yes Will a triage be completed? ---Yes Related visit to physician within the last 2 weeks? ---No Does the PT have any chronic conditions? (i.e. diabetes, asthma, this includes High risk factors for pregnancy, etc.) ---Yes List chronic conditions. ---DM Is this a behavioral health or substance abuse call? ---No Guidelines Guideline Title Affirmed Question Affirmed Notes Nurse Date/Time (Eastern Time) COVID-19 - Diagnosed or Suspected [1] COVID-19 infection suspected by caller or triager AND [2] mild symptoms (cough, fever, or others) AND [3] negative COVID-19 rapid test Seals, RN, Clarice 12/10/2020 9:20:13 AM PLEASE NOTE: All timestamps contained within this report are represented as [place] Standard Time. CONFIDENTIALTY NOTICE: This fax transmission is intended only for the addressee. It contains information that is legally privileged, confidential or otherwise protected from use or disclosure. If you are not the intended recipient, you are strictly prohibited from reviewing, disclosing, copying using or disseminating any of this information or taking any action in reliance on or regarding this information. If you have received this fax in error, please notify us immediately by telephone so that we can arrange for its return to [place]. Phone: [phone], Toll-Free: [phone], Fax: [phone] Page: 2 of 2 Call Id: [id number] Disp. Time Lamount Cohen Time) Disposition Final User 12/10/2020 9:25:20 AM Call PCP when Office is Open Yes Seals, RN, Nutritional therapist Disagree/Comply Comply Caller Understands  Yes PreDisposition Did not know what to do Care Advice Given Per Guideline CALL PCP WHEN OFFICE IS OPEN: * You need to discuss this with your doctor (or NP/PA) within the next few days. COUGH MEDICINES: * COUGH SYRUP WITH DEXTROMETHORPHAN: An over-the-counter cough syrup can help your cough. The most common cough suppressant in over-the-counter cough medicines is dextromethorphan. COUGH SYRUP WITH DEXTROMETHORPHAN - EXTRA NOTES AND WARNINGS: * Do not try to completely stop coughs that produce mucus and phlegm. * Coughing is helpful. It brings up the mucus from the lungs and helps prevent pneumonia. * WARNING: Do not take dextromethorphan if you are taking venlafaxine (Effexor). HUMIDIFIER: * If the air is dry, use a humidifier in the bedroom. * Dry air makes coughs worse. COUGHING SPELLS: * Drink warm fluids. Inhale warm mist. This can help relax the airway and also loosen up phlegm. AVOID TOBACCO SMOKE: * Avoid tobacco smoke. CALL BACK IF: * Fever over 103 F (39.4 C) * Chest pain or difficulty breathing occurs * You become worse CARE ADVICE given per COVID-19 - DIAGNOSED OR SUSPECTED (Adult) guideline. Referrals REFERRED TO PCP OFFICE  Appt w/ PCP today.

## 2020-12-13 ENCOUNTER — Telehealth: Payer: Self-pay | Admitting: Internal Medicine

## 2020-12-13 NOTE — Telephone Encounter (Signed)
Returned call to patient-she did not start the medication she only read about the medication and side effects.  She states she has a lot going on and does not want to "take a chance".     Discussed medication and why Dr. Rennis Golden started this and thought it would benefit her.  She still does not want to take medication and already told her pharmacy.  Removed from med list and will make MD aware.

## 2020-12-13 NOTE — Telephone Encounter (Signed)
Pt c/o medication issue:  1. Name of Medication: empagliflozin (JARDIANCE) 10 MG TABS tablet  2. How are you currently taking this medication (dosage and times per day)?  Take 1 tablet (10 mg total) by mouth daily before breakfast.  3. Are you having a reaction (difficulty breathing--STAT)? No   4. What is your medication issue? Patient said due to too many side effects that will affect her, she has decided not to take the medication. Has already let the pharmacy know.  Says that she is already going through a lot health wise.

## 2020-12-14 NOTE — Telephone Encounter (Signed)
Thanks for the FYI.  Dr H 

## 2020-12-18 ENCOUNTER — Encounter: Payer: Medicare PPO | Admitting: Family Medicine

## 2020-12-20 ENCOUNTER — Ambulatory Visit (INDEPENDENT_AMBULATORY_CARE_PROVIDER_SITE_OTHER): Payer: Medicare PPO

## 2020-12-20 VITALS — Ht 66.0 in | Wt 257.0 lb

## 2020-12-20 DIAGNOSIS — Z0000 Encounter for general adult medical examination without abnormal findings: Secondary | ICD-10-CM | POA: Diagnosis not present

## 2020-12-20 NOTE — Progress Notes (Signed)
Subjective:   Sharon Yu is a 70 y.o. female who presents for Medicare Annual (Subsequent) preventive examination.  I connected with [name] today by telephone and verified that I am speaking with the correct person using two identifiers. Location patient: home Location provider: work Persons participating in the virtual visit: patient, Engineer, civil (consulting).    I discussed the limitations, risks, security and privacy concerns of performing an evaluation and management service by telephone and the availability of in person appointments. I also discussed with the patient that there may be a patient responsible charge related to this service. The patient expressed understanding and verbally consented to this telephonic visit.    Interactive audio and video telecommunications were attempted between this provider and patient, however failed, due to patient having technical difficulties OR patient did not have access to video capability.  We continued and completed visit with audio only.  Some vital signs may be absent or patient reported.   Time Spent with patient on telephone encounter: 25 minutes   Review of Systems     Cardiac Risk Factors include: advanced age (>75men, >99 women);diabetes mellitus;dyslipidemia;hypertension;obesity (BMI >30kg/m2)     Objective:    Today's Vitals   12/20/20 1021  Weight: 257 lb (116.6 kg)  Height: 5\' 6"  (1.676 m)  PainSc: 7    Body mass index is 41.48 kg/m.  Advanced Directives 12/20/2020 11/22/2019 05/29/2018 07/15/2017 07/02/2013 06/25/2013 02/10/2013  Does Patient Have a Medical Advance Directive? No No No No Patient does not have advance directive;Patient would not like information Patient does not have advance directive;Patient would not like information Patient does not have advance directive;Patient would like information  Would patient like information on creating a medical advance directive? Yes (MAU/Ambulatory/Procedural Areas - Information given) No -  Patient declined No - Patient declined No - Patient declined - - Advance directive packet given  Pre-existing out of facility DNR order (yellow form or pink MOST form) - - - - - - No    Current Medications (verified) Outpatient Encounter Medications as of 12/20/2020  Medication Sig  . alendronate (FOSAMAX) 70 MG tablet Take 1 tablet (70 mg total) by mouth every 7 (seven) days. Saturdays. Take with a full glass of water on an empty stomach.  Marland Kitchen aspirin 81 MG chewable tablet Chew 81 mg daily by mouth.  Marland Kitchen atorvastatin (LIPITOR) 40 MG tablet Take 1 tablet (40 mg total) by mouth daily.  . benzonatate (TESSALON) 100 MG capsule Take 1 capsule (100 mg total) by mouth 3 (three) times daily as needed.  . carvedilol (COREG) 25 MG tablet Take 1 tablet (25 mg total) by mouth 2 (two) times daily with a meal.  . ENTRESTO 97-103 MG Take 1 tablet by mouth twice daily  . furosemide (LASIX) 40 MG tablet Take 1 tablet by mouth once daily  . meloxicam (MOBIC) 15 MG tablet Take 1 tablet (15 mg total) by mouth daily as needed for pain.  . metFORMIN (GLUCOPHAGE) 500 MG tablet Take 1 tablet by mouth once daily with breakfast  . Multiple Vitamin (MULITIVITAMIN WITH MINERALS) TABS Take 1 tablet by mouth daily.  . Potassium (POTASSIMIN PO) Take by mouth.  . Vitamin D3 (VITAMIN D) 25 MCG tablet Take 1,000 Units by mouth daily.  . vitamin E 180 MG (400 UNITS) capsule Take 400 Units by mouth daily.  . hydrALAZINE (APRESOLINE) 25 MG tablet Take 1 tablet (25 mg total) by mouth in the morning and at bedtime.   No facility-administered encounter medications on  file as of 12/20/2020.    Allergies (verified) Other   History: Past Medical History:  Diagnosis Date  . Arthritis   . Back pain    buldging disc  . Chronic back pain   . Hyperlipidemia    takes Crestor daily  . Hypertension    takes Maxzide and Metoprolol daily along with Ramipril  . Hypopotassemia    takes Potassium pill daily  . Joint pain   . Joint  swelling   . Nonischemic cardiomyopathy (HCC)    a. 07/2017: EF reduced to 25-30%, cath showing normal cors.   . Shortness of breath    with exertion   Past Surgical History:  Procedure Laterality Date  . bilateral knee arthroscopies    . carpel tunnel     bilateral  . CHOLECYSTECTOMY    . colonscopy    . JOINT REPLACEMENT Left   . RIGHT/LEFT HEART CATH AND CORONARY ANGIOGRAPHY N/A 07/17/2017   Procedure: RIGHT/LEFT HEART CATH AND CORONARY ANGIOGRAPHY;  Surgeon: Swaziland, Peter M, MD;  Location[facility] INVASIVE CV LAB;  Service: Cardiovascular;  Laterality: N/A;  . TOTAL KNEE ARTHROPLASTY Right 02/19/2013   Procedure: TOTAL KNEE ARTHROPLASTY;  Surgeon: Thera Flake., MD;  Location: [facility] OR;  Service: Orthopedics;  Laterality: Right;  right total knee arthroplasty  . TOTAL KNEE REVISION Left 07/02/2013   Procedure: LEFT TOTAL KNEE TIBIA REVISION;  Surgeon: Nestor Lewandowsky, MD;  Location: [facility] OR;  Service: Orthopedics;  Laterality: Left;   Family History  Problem Relation Age of Onset  . Hypertension Mother   . Arthritis Mother   . Asthma Mother   . Diabetes Mother   . Hyperlipidemia Mother   . Cancer Mother        uterine cancer  . CAD Sister   . Heart failure Sister   . Hyperlipidemia Sister   . Heart disease Sister   . Early death Father   . Asthma Brother   . COPD Brother   . Depression Brother   . AAA (abdominal aortic aneurysm) Daughter   . Hearing loss Daughter   . Early death Daughter 38       Spinal miningitis   Social History   Socioeconomic History  . Marital status: Widowed    Spouse name: Not on file  . Number of children: Not on file  . Years of education: Not on file  . Highest education level: Not on file  Occupational History  . Occupation: retired  Tobacco Use  . Smoking status: Never Smoker  . Smokeless tobacco: Never Used  Substance and Sexual Activity  . Alcohol use: No  . Drug use: No  . Sexual activity: Yes    Birth control/protection:  Post-menopausal  Other Topics Concern  . Not on file  Social History Narrative  . Not on file   Social Determinants of Health   Financial Resource Strain: Low Risk   . Difficulty of Paying Living Expenses: Not very hard  Food Insecurity: No Food Insecurity  . Worried About Programme researcher, broadcasting/film/video in the Last Year: Never true  . Ran Out of Food in the Last Year: Never true  Transportation Needs: No Transportation Needs  . Lack of Transportation (Medical): No  . Lack of Transportation (Non-Medical): No  Physical Activity: Sufficiently Active  . Days of Exercise per Week: 3 days  . Minutes of Exercise per Session: 60 min  Stress: No Stress Concern Present  . Feeling of Stress : Not at all  Social Connections: Moderately Integrated  . Frequency of Communication with Friends and Family: More than three times a week  . Frequency of Social Gatherings with Friends and Family: More than three times a week  . Attends Religious Services: More than 4 times per year  . Active Member of Clubs or Organizations: Yes  . Attends Banker Meetings: 1 to 4 times per year  . Marital Status: Widowed    Tobacco Counseling Counseling given: Not Answered   Clinical Intake:  Pre-visit preparation completed: Yes  Pain : 0-10 Pain Score: 7  Pain Type: Chronic pain Pain Location: Back Pain Orientation: Lower Pain Onset: More than a month ago Pain Frequency: Constant     Nutritional Status: BMI > 30  Obese Nutritional Risks: None Diabetes: Yes CBG done?: No Did pt. bring in CBG monitor from home?: No (phone visit)  How often do you need to have someone help you when you read instructions, pamphlets, or other written materials from your doctor or pharmacy?: 1 - Never  Diabetes:  Is the patient diabetic?  Yes  If diabetic, was a CBG obtained today?  No  Did the patient bring in their glucometer from home?  No  How often do you monitor your CBG's? never.   Financial Strains and  Diabetes Management:  Are you having any financial strains with the device, your supplies or your medication? No .  Does the patient want to be seen by Chronic Care Management for management of their diabetes?  No  Would the patient like to be referred to a Nutritionist or for Diabetic Management?  No   Diabetic Exams:  Diabetic Eye Exam: Completed 10/2020. Patient has results & will bring them to her appt with PCP in May.  Diabetic Foot Exam:  Pt has been advised about the importance in completing this exam. To be completed by PCP.     Interpreter Needed?: No  Information entered by :: Thomasenia Sales LPN   Activities of Daily Living In your present state of health, do you have any difficulty performing the following activities: 12/20/2020  Hearing? N  Vision? N  Difficulty concentrating or making decisions? N  Walking or climbing stairs? N  Dressing or bathing? N  Doing errands, shopping? N  Preparing Food and eating ? N  Using the Toilet? N  In the past six months, have you accidently leaked urine? N  Do you have problems with loss of bowel control? N  Managing your Medications? N  Managing your Finances? N  Housekeeping or managing your Housekeeping? N  Some recent data might be hidden    Patient Care Team: [name], DO as PCP - General (Family Medicine) Rennis Golden Lisette Abu, MD as PCP - Cardiology (Cardiology)  Indicate any recent Medical Services you may have received from other than [facility] providers in the past year (date may be approximate).     Assessment:   This is a routine wellness examination for [facility].  Hearing/Vision screen  Hearing Screening   125Hz  250Hz  500Hz  1000Hz  2000Hz  3000Hz  4000Hz  6000Hz  8000Hz   Right ear:           Left ear:           Comments: No issues  Vision Screening Comments: Wears glasses Last eye exam-10/2020-Dr.Atkins   Dietary issues and exercise activities discussed: Current Exercise Habits: Home exercise routine, Type  of exercise: strength training/weights;Other - see comments (goes to the gym), Time (Minutes): 60, Frequency (Times/Week): 3, Weekly Exercise (  Minutes/Week): 180, Intensity: Mild, Exercise limited by: None identified  Goals    . Increase physical activity    . Patient Stated     Drink more water       Depression Screen PHQ 2/9 Scores 12/20/2020 12/11/2020 11/22/2019  PHQ - 2 Score 1 0 0    Fall Risk Fall Risk  12/20/2020 11/22/2019  Falls in the past year? 0 0  Number falls in past yr: 0 0  Injury with Fall? 0 0  Follow up Falls prevention discussed Education provided;Falls prevention discussed    FALL RISK PREVENTION PERTAINING TO THE HOME:  Any stairs in or around the home? Yes  If so, are there any without handrails? No  Home free of loose throw rugs in walkways, pet beds, electrical cords, etc? Yes  Adequate lighting in your home to reduce risk of falls? Yes   ASSISTIVE DEVICES UTILIZED TO PREVENT FALLS:  Life alert? No  Use of a cane, walker or w/c? Yes  Grab bars in the bathroom? Yes  Shower chair or bench in shower? No  Elevated toilet seat or a handicapped toilet? No   TIMED UP AND GO:  Was the test performed? No . Phone visit   Cognitive Function:Normal cognitive status assessed by  this Nurse Health Advisor. No abnormalities found.          Immunizations Immunization History  Administered Date(s) Administered  . Fluad Quad(high Dose 65+) 05/31/2020  . Influenza-Unspecified 05/13/2019  . PFIZER(Purple Top)SARS-COV-2 Vaccination 10/07/2019, 10/28/2019, 06/02/2020  . Pneumococcal Conjugate-13 04/27/2018  . Pneumococcal Polysaccharide-23 06/09/2014, 10/15/2018  . Tdap 10/15/2018  . Zoster Recombinat (Shingrix) 06/09/2017, 06/09/2018    TDAP status: Up to date  Flu Vaccine status: Up to date  Pneumococcal vaccine status: Up to date  Covid-19 vaccine status: Completed vaccines  Qualifies for Shingles Vaccine? No   Zostavax completed No   Shingrix  Completed?: Yes  Screening Tests Health Maintenance  Topic Date Due  . OPHTHALMOLOGY EXAM  01/01/2019  . FOOT EXAM  11/23/2020  . HEMOGLOBIN A1C  11/28/2020  . INFLUENZA VACCINE  04/02/2021  . MAMMOGRAM  12/26/2021  . COLONOSCOPY (Pts 45-2146yrs Insurance coverage will need to be confirmed)  11/24/2023  . TETANUS/TDAP  10/15/2028  . DEXA SCAN  Completed  . COVID-19 Vaccine  Completed  . Hepatitis C Screening  Completed  . HPV VACCINES  Aged Out  . PNA vac Low Risk Adult  Discontinued    Health Maintenance  Health Maintenance Due  Topic Date Due  . OPHTHALMOLOGY EXAM  01/01/2019  . FOOT EXAM  11/23/2020  . HEMOGLOBIN A1C  11/28/2020    Colorectal cancer screening: Type of screening: Colonoscopy. Completed 11/23/2013. Repeat every 10 years  Mammogram status: Scheduled for 01/10/2021  Bone Density status: Completed 11/09/2018. Results reflect: Bone density results: NORMAL. Repeat every 2-3 years.  Lung Cancer Screening: (Low Dose CT Chest recommended if Age 22-80 years, 30 pack-year currently smoking OR have quit w/in 15years.) does not qualify.    Additional Screening:  Hepatitis C Screening:Completed 10/15/2018  Vision Screening: Recommended annual ophthalmology exams for early detection of glaucoma and other disorders of the eye. Is the patient up to date with their annual eye exam?  Yes  Who is the provider or what is the name of the office in which the patient attends annual eye exams? Dr. Vickey SagesAtkins   Dental Screening: Recommended annual dental exams for proper oral hygiene  Community Resource Referral / Chronic Care Management: CRR required  this visit?  Yes For assistance buying OTC meds  CCM required this visit?  No      Plan:     I have personally reviewed and noted the following in the patient's chart:   . Medical and social history . Use of alcohol, tobacco or illicit drugs  . Current medications and supplements . Functional ability and  status . Nutritional status . Physical activity . Advanced directives . List of other physicians . Hospitalizations, surgeries, and ER visits in previous 12 months . Vitals . Screenings to include cognitive, depression, and falls . Referrals and appointments  In addition, I have reviewed and discussed with patient certain preventive protocols, quality metrics, and best practice recommendations. A written personalized care plan for preventive services as well as general preventive health recommendations were provided to patient.   Due to this being a telephonic visit, the after visit summary with patients personalized plan was offered to patient via mail or my-chart.  Per request, patient was mailed a copy of AVS.   Roanna Raider, LPN   [id number]  Nurse Health Advisor  Nurse Notes: None

## 2020-12-20 NOTE — Patient Instructions (Signed)
Sharon Yu , Thank you for taking time to completeyour Medicare Wellness Visit. I appreciate your ongoing commitment to your health goals. Please review the following plan we discussed and let me know if I can assist you in the future.   Screening recommendations/referrals: Colonoscopy: Completed 11/23/2013-Due 11/24/2023 Mammogram: Scheduled for 01/10/2021 Bone Density: Completed 11/09/2018-Discuss with Dr. Carmelia Roller for next due date. Recommended yearly ophthalmology/optometry visit for glaucoma screening and checkup Recommended yearly dental visit for hygiene and checkup  Vaccinations: Influenza vaccine: Up to date Pneumococcal vaccine: Completed vaccines Tdap vaccine: Up to date-Due 10/15/2028 Shingles vaccine: Completed vaccines   Covid-19:Completed vaccines  Advanced directives: Information mailed today  Conditions/risks identified: See problem list  Next appointment: Follow up in one year for your annual wellness visit 12/27/2021 @ 10:20   Preventive Care 68 Years and Older, Female Preventive care refers to lifestyle choices and visits with your health care provider that can promote health and wellness. What does preventive care include?  A yearly physical exam. This is also called an annual well check.  Dental exams once or twice a year.  Routine eye exams. Ask your health care provider how often you should have your eyes checked.  Personal lifestyle choices, including:  Daily care of your teeth and gums.  Regular physical activity.  Eating a healthy diet.  Avoiding tobacco and drug use.  Limiting alcohol use.  Practicing safe sex.  Taking low-dose aspirin every day.  Taking vitamin and mineral supplements as recommended by your health care provider. What happens during an annual well check? The services and screenings done by your health care provider during your annual well check will depend on your age, overall health, lifestyle risk factors, and family history  of disease. Counseling  Your health care provider may ask you questions about your:  Alcohol use.  Tobacco use.  Drug use.  Emotional well-being.  Home and relationship well-being.  Sexual activity.  Eating habits.  History of falls.  Memory and ability to understand (cognition).  Work and work Astronomer.  Reproductive health. Screening  You may have the following tests or measurements:  Height, weight, and BMI.  Blood pressure.  Lipid and cholesterol levels. These may be checked every 5 years, or more frequently if you are over 22 years old.  Skin check.  Lung cancer screening. You may have this screening every year starting at age 68 if you have a 30-pack-year history of smoking and currently smoke or have quit within the past 15 years.  Fecal occult blood test (FOBT) of the stool. You may have this test every year starting at age 68.  Flexible sigmoidoscopy or colonoscopy. You may have a sigmoidoscopy every 5 years or a colonoscopy every 10 years starting at age 68.  Hepatitis C blood test.  Hepatitis B blood test.  Sexually transmitted disease (STD) testing.  Diabetes screening. This is done by checking your blood sugar (glucose) after you have not eaten for a while (fasting). You may have this done every 1-3 years.  Bone density scan. This is done to screen for osteoporosis. You may have this done starting at age 68.  Mammogram. This may be done every 1-2 years. Talk to your health care provider about how often you should have regular mammograms. Talk with your health care provider about your test results, treatment options, and if necessary, the need for more tests. Vaccines  Your health care provider may recommend certain vaccines, such as:  Influenza vaccine. This is recommended every year.  Tetanus, diphtheria, and acellular pertussis (Tdap, Td) vaccine. You may need a Td booster every 10 years.  Zoster vaccine. You may need this after age  68.  Pneumococcal 13-valent conjugate (PCV13) vaccine. One dose is recommended after age 68.  Pneumococcal polysaccharide (PPSV23) vaccine. One dose is recommended after age 68. Talk to your health care provider about which screenings and vaccines you need and how often you need them. This information is not intended to replace advice given to you by your health care provider. Make sure you discuss any questions you have with your health care provider. Document Released: 09/15/2015 Document Revised: 05/08/2016 Document Reviewed: 06/20/2015 Elsevier Interactive Patient Education  2017 [employer] Prevention in the Home Falls can cause injuries. They can happen to people of all ages. There are many things you can do to make your home safe and to help prevent falls. What can I do on the outside of my home?  Regularly fix the edges of walkways and driveways and fix any cracks.  Remove anything that might make you trip as you walk through a door, such as a raised step or threshold.  Trim any bushes or trees on the path to your home.  Use bright outdoor lighting.  Clear any walking paths of anything that might make someone trip, such as rocks or tools.  Regularly check to see if handrails are loose or broken. Make sure that both sides of any steps have handrails.  Any raised decks and porches should have guardrails on the edges.  Have any leaves, snow, or ice cleared regularly.  Use sand or salt on walking paths during winter.  Clean up any spills in your garage right away. This includes oil or grease spills. What can I do in the bathroom?  Use night lights.  Install grab bars by the toilet and in the tub and shower. Do not use towel bars as grab bars.  Use non-skid mats or decals in the tub or shower.  If you need to sit down in the shower, use a plastic, non-slip stool.  Keep the floor dry. Clean up any water that spills on the floor as soon as it happens.  Remove  soap buildup in the tub or shower regularly.  Attach bath mats securely with double-sided non-slip rug tape.  Do not have throw rugs and other things on the floor that can make you trip. What can I do in the bedroom?  Use night lights.  Make sure that you have a light by your bed that is easy to reach.  Do not use any sheets or blankets that are too big for your bed. They should not hang down onto the floor.  Have a firm chair that has side arms. You can use this for support while you get dressed.  Do not have throw rugs and other things on the floor that can make you trip. What can I do in the kitchen?  Clean up any spills right away.  Avoid walking on wet floors.  Keep items that you use a lot in easy-to-reach places.  If you need to reach something above you, use a strong step stool that has a grab bar.  Keep electrical cords out of the way.  Do not use floor polish or wax that makes floors slippery. If you must use wax, use non-skid floor wax.  Do not have throw rugs and other things on the floor that can make you trip. What can I do  with my stairs?  Do not leave any items on the stairs.  Make sure that there are handrails on both sides of the stairs and use them. Fix handrails that are broken or loose. Make sure that handrails are as long as the stairways.  Check any carpeting to make sure that it is firmly attached to the stairs. Fix any carpet that is loose or worn.  Avoid having throw rugs at the top or bottom of the stairs. If you do have throw rugs, attach them to the floor with carpet tape.  Make sure that you have a light switch at the top of the stairs and the bottom of the stairs. If you do not have them, ask someone to add them for you. What else can I do to help prevent falls?  Wear shoes that:  Do not have high heels.  Have rubber bottoms.  Are comfortable and fit you well.  Are closed at the toe. Do not wear sandals.  If you use a  stepladder:  Make sure that it is fully opened. Do not climb a closed stepladder.  Make sure that both sides of the stepladder are locked into place.  Ask someone to hold it for you, if possible.  Clearly mark and make sure that you can see:  Any grab bars or handrails.  First and last steps.  Where the edge of each step is.  Use tools that help you move around (mobility aids) if they are needed. These include:  Canes.  Walkers.  Scooters.  Crutches.  Turn on the lights when you go into a dark area. Replace any light bulbs as soon as they burn out.  Set up your furniture so you have a clear path. Avoid moving your furniture around.  If any of your floors are uneven, fix them.  If there are any pets around you, be aware of where they are.  Review your medicines with your doctor. Some medicines can make you feel dizzy. This can increase your chance of falling. Ask your doctor what other things that you can do to help prevent falls. This information is not intended to replace advice given to you by your health care provider. Make sure you discuss any questions you have with your health care provider. Document Released: 06/15/2009 Document Revised: 01/25/2016 Document Reviewed: 09/23/2014 Elsevier Interactive Patient Education  2017 [employer].

## 2020-12-26 ENCOUNTER — Other Ambulatory Visit: Payer: Self-pay | Admitting: Internal Medicine

## 2020-12-26 ENCOUNTER — Other Ambulatory Visit: Payer: Self-pay | Admitting: Family Medicine

## 2020-12-28 ENCOUNTER — Telehealth: Payer: Self-pay | Admitting: *Deleted

## 2020-12-28 NOTE — Telephone Encounter (Signed)
   Telephone encounter was:  Unsuccessful.  12/28/2020 Name: Laikyn Gewirtz MRN: [id number] DOB: 11/22/1950  Unsuccessful outbound call made today to assist with:  buying over the counter meds  Outreach Attempt:  2nd Attempt  Mailbox is full  Alois Cliche -[facility] Guide , Embedded Care Coordination [facility], Care Management  [phone] [address] [place] , [place] [address] Email : [email or web address]

## 2021-01-03 ENCOUNTER — Telehealth: Payer: Self-pay | Admitting: *Deleted

## 2021-01-03 NOTE — Telephone Encounter (Signed)
   Telephone encounter was:  Unsuccessful.  01/03/2021 Name: Ricci Paff MRN: [id number] DOB: [birth year]  Unsuccessful outbound call made today to assist with:  otc meds  Outreach Attempt:  2nd Attempt  No voicemail left ,  Alois Cliche -[facility] Guide , Embedded Care Coordination [facility], Care Management  [phone] [address] [place] , [place] [address] Email : [email or web address]

## 2021-01-03 NOTE — Telephone Encounter (Signed)
   Telephone encounter was:  Unsuccessful.  01/03/2021 Name: Sharon Yu MRN: [id number] DOB: 12-Mar-1951  Unsuccessful outbound call made today to assist with:  OTC meds   Outreach Attempt:  2nd Attempt  No answer voicemail full  Alois Cliche -[facility] Guide , Embedded Care Coordination [facility], Care Management  [phone] [address] [place] , [place] [address] Email : [email or web address]

## 2021-01-04 ENCOUNTER — Telehealth: Payer: Self-pay | Admitting: *Deleted

## 2021-01-04 NOTE — Telephone Encounter (Signed)
   Telephone encounter was:  Unsuccessful.  01/04/2021 Name: Sharon Yu MRN: [id number] DOB: 02/26/1951  Unsuccessful outbound call made today to assist with:  OTC medicines   Outreach Attempt:  3rd Attempt.  Referral closed unable to contact patient.  Mailbox full   Alois Cliche -[facility] Guide , Embedded Care Coordination [facility], Care Management  [phone] [address] [place] , [place] [address] Email : [email or web address]

## 2021-01-08 ENCOUNTER — Encounter (HOSPITAL_BASED_OUTPATIENT_CLINIC_OR_DEPARTMENT_OTHER): Payer: Self-pay

## 2021-01-08 ENCOUNTER — Ambulatory Visit (HOSPITAL_BASED_OUTPATIENT_CLINIC_OR_DEPARTMENT_OTHER)
Admission: RE | Admit: 2021-01-08 | Discharge: 2021-01-08 | Disposition: A | Discharge status: Home / Self Care | Payer: Medicare PPO | Source: Ambulatory Visit | Attending: Family Medicine | Admitting: Family Medicine

## 2021-01-08 ENCOUNTER — Other Ambulatory Visit: Payer: Self-pay

## 2021-01-08 DIAGNOSIS — Z1231 Encounter for screening mammogram for malignant neoplasm of breast: Secondary | ICD-10-CM | POA: Insufficient documentation

## 2021-01-10 ENCOUNTER — Encounter: Payer: Medicare PPO | Admitting: Family Medicine

## 2021-01-10 ENCOUNTER — Ambulatory Visit (HOSPITAL_BASED_OUTPATIENT_CLINIC_OR_DEPARTMENT_OTHER): Payer: Medicare PPO

## 2021-01-24 ENCOUNTER — Ambulatory Visit (INDEPENDENT_AMBULATORY_CARE_PROVIDER_SITE_OTHER): Payer: Medicare PPO | Admitting: Family Medicine

## 2021-01-24 ENCOUNTER — Other Ambulatory Visit: Payer: Self-pay

## 2021-01-24 ENCOUNTER — Other Ambulatory Visit: Payer: Self-pay | Admitting: Family Medicine

## 2021-01-24 ENCOUNTER — Encounter: Payer: Self-pay | Admitting: Family Medicine

## 2021-01-24 VITALS — BP 130/78 | HR 80 | Temp 98.5°F | Ht 66.0 in | Wt 260.1 lb

## 2021-01-24 DIAGNOSIS — E1165 Type 2 diabetes mellitus with hyperglycemia: Secondary | ICD-10-CM

## 2021-01-24 DIAGNOSIS — E785 Hyperlipidemia, unspecified: Secondary | ICD-10-CM

## 2021-01-24 DIAGNOSIS — Z0000 Encounter for general adult medical examination without abnormal findings: Secondary | ICD-10-CM

## 2021-01-24 DIAGNOSIS — I712 Thoracic aortic aneurysm, without rupture: Secondary | ICD-10-CM

## 2021-01-24 DIAGNOSIS — I7121 Aneurysm of the ascending aorta, without rupture: Secondary | ICD-10-CM

## 2021-01-24 LAB — COMPREHENSIVE METABOLIC PANEL
ALT: 15 U/L (ref 0–35)
AST: 13 U/L (ref 0–37)
Albumin: 4 g/dL (ref 3.5–5.2)
Alkaline Phosphatase: 79 U/L (ref 39–117)
BUN: 17 mg/dL (ref 6–23)
CO2: 28 mEq/L (ref 19–32)
Calcium: 8.7 mg/dL (ref 8.4–10.5)
Chloride: 102 mEq/L (ref 96–112)
Creatinine, Ser: 0.84 mg/dL (ref 0.40–1.20)
GFR: 71.61 mL/min (ref >60.00)
Glucose, Bld: 118 mg/dL — ABNORMAL HIGH (ref 70–99)
Potassium: 3.6 mEq/L (ref 3.5–5.1)
Sodium: 140 mEq/L (ref 135–145)
Total Bilirubin: 0.3 mg/dL (ref 0.2–1.2)
Total Protein: 7 g/dL (ref 6.0–8.3)

## 2021-01-24 LAB — LIPID PANEL
Cholesterol: 203 mg/dL — ABNORMAL HIGH (ref 0–200)
HDL: 42.3 mg/dL (ref >39.00)
LDL Cholesterol: 126 mg/dL — ABNORMAL HIGH (ref 0–99)
NonHDL: 160.3
Total CHOL/HDL Ratio: 5
Triglycerides: 171 mg/dL — ABNORMAL HIGH (ref 0.0–149.0)
VLDL: 34.2 mg/dL (ref 0.0–40.0)

## 2021-01-24 LAB — CBC
HCT: 38.7 % (ref 36.0–46.0)
Hemoglobin: 12.8 g/dL (ref 12.0–15.0)
MCHC: 33.2 g/dL (ref 30.0–36.0)
MCV: 87.7 fl (ref 78.0–100.0)
Platelets: 193 10*3/uL (ref 150.0–400.0)
RBC: 4.41 Mil/uL (ref 3.87–5.11)
RDW: 14.8 % (ref 11.5–15.5)
WBC: 7.1 10*3/uL (ref 4.0–10.5)

## 2021-01-24 LAB — MICROALBUMIN / CREATININE URINE RATIO
Creatinine,U: 166.7 mg/dL
Microalb Creat Ratio: 21.3 mg/g (ref 0.0–30.0)
Microalb, Ur: 35.6 mg/dL — ABNORMAL HIGH (ref 0.0–1.9)

## 2021-01-24 LAB — HEMOGLOBIN A1C: Hgb A1c MFr Bld: 6.2 % (ref 4.6–6.5)

## 2021-01-24 NOTE — Patient Instructions (Signed)
Give us 2-3 business days to get the results of your labs back.   Keep the diet clean and stay active.  Let us know if you need anything. 

## 2021-01-24 NOTE — Progress Notes (Signed)
Chief Complaint  Patient presents with  . Annual Exam     Well Woman Sharon Yu is here for a complete physical.   Her last physical was >1 year ago.  Current diet: in general, a "not good" diet. Current exercise: walking, cycling, some weights. Weight is stable and she denies daytime fatigue. Seatbelt? Yes  Health Maintenance Colonoscopy- Done Shingrix- Yes DEXA- Yes Mammogram- Yes Tetanus- Yes Pneumonia- Yes Hep C screen- Yes  Past Medical History:  Diagnosis Date  . Arthritis   . Back pain    buldging disc  . Chronic back pain   . Hyperlipidemia    takes Crestor daily  . Hypertension    takes Maxzide and Metoprolol daily along with Ramipril  . Hypopotassemia    takes Potassium pill daily  . Joint pain   . Joint swelling   . Nonischemic cardiomyopathy (HCC)    a. 07/2017: EF reduced to 25-30%, cath showing normal cors.   . Shortness of breath    with exertion     Past Surgical History:  Procedure Laterality Date  . bilateral knee arthroscopies    . carpel tunnel     bilateral  . CHOLECYSTECTOMY    . colonscopy    . JOINT REPLACEMENT Left   . RIGHT/LEFT HEART CATH AND CORONARY ANGIOGRAPHY N/A 07/17/2017   Procedure: RIGHT/LEFT HEART CATH AND CORONARY ANGIOGRAPHY;  Surgeon: Swaziland, Peter M, MD;  Location: [facility] INVASIVE CV LAB;  Service: Cardiovascular;  Laterality: N/A;  . TOTAL KNEE ARTHROPLASTY Right 02/19/2013   Procedure: TOTAL KNEE ARTHROPLASTY;  Surgeon: Thera Flake., MD;  [facility]: [facility] OR;  Service: Orthopedics;  Laterality: Right;  right total knee arthroplasty  . TOTAL KNEE REVISION Left 07/02/2013   Procedure: LEFT TOTAL KNEE TIBIA REVISION;  Surgeon: Nestor Lewandowsky, MD;  Location: [facility] OR;  Service: Orthopedics;  Laterality: Left;    Medications  Current Outpatient Medications on File Prior to Visit  Medication Sig Dispense Refill  . alendronate (FOSAMAX) 70 MG tablet Take 1 tablet (70 mg total) by mouth every 7 (seven) days. Saturdays. Take  with a full glass of water on an empty stomach. 12 tablet 2  . aspirin 81 MG chewable tablet Chew 81 mg daily by mouth.    Marland Kitchen atorvastatin (LIPITOR) 40 MG tablet Take 1 tablet (40 mg total) by mouth daily. 90 tablet 0  . carvedilol (COREG) 25 MG tablet Take 1 tablet (25 mg total) by mouth 2 (two) times daily with a meal. 180 tablet 3  . ENTRESTO 97-103 MG Take 1 tablet by mouth twice daily 180 tablet 2  . furosemide (LASIX) 40 MG tablet Take 1 tablet by mouth once daily 90 tablet 0  . meloxicam (MOBIC) 15 MG tablet TAKE 1 TABLET BY MOUTH ONCE DAILY AS NEEDED FOR PAIN 30 tablet 0  . metFORMIN (GLUCOPHAGE) 500 MG tablet Take 1 tablet by mouth once daily with breakfast 90 tablet 0  . Multiple Vitamin (MULITIVITAMIN WITH MINERALS) TABS Take 1 tablet by mouth daily.    . Potassium (POTASSIMIN PO) Take by mouth.    . Vitamin D3 (VITAMIN D) 25 MCG tablet Take 1,000 Units by mouth daily.    . vitamin E 180 MG (400 UNITS) capsule Take 400 Units by mouth daily.    . hydrALAZINE (APRESOLINE) 25 MG tablet Take 1 tablet (25 mg total) by mouth in the morning and at bedtime. 180 tablet 3    Allergies Allergies  Allergen Reactions  .  Other Hives and Swelling    "Grapple" (hybrid apple & grape)    Review of Systems: Constitutional:  no fevers Eye:  no recent significant change in vision Ears:  No changes in hearing Nose/Mouth/Throat:  no complaints of nasal congestion, no sore throat Cardiovascular: no chest pain Respiratory:  No shortness of breath Gastrointestinal:  No change in bowel habits GU:  Female: negative for dysuria Integumentary:  no abnormal skin lesions reported Neurologic:  no headaches Endocrine:  denies unexplained weight changes  Exam BP 130/78 (BP Location: Left Arm, Patient Position: Sitting, Cuff Size: Large)   Pulse 80   Temp 98.5 F (36.9 C) (Oral)   Ht 5\' 6"  (1.676 m)   Wt 260 lb 2 oz (118 kg)   SpO2 99%   BMI 41.99 kg/m  General:  well developed, well nourished, in  no apparent distress Skin:  no significant moles, warts, or growths; benign foot exam b/l Head:  no masses, lesions, or tenderness Eyes:  pupils equal and round, sclera anicteric without injection Ears:  canals without lesions, TMs shiny without retraction, no obvious effusion, no erythema Nose:  nares patent, septum midline, mucosa normal, and no drainage or sinus tenderness Throat/Pharynx:  lips and gingiva without lesion; tongue and uvula midline; non-inflamed pharynx; no exudates or postnasal drainage Neck: neck supple without adenopathy, thyromegaly, or masses Lungs:  clear to auscultation, breath sounds equal bilaterally, no respiratory distress Cardio:  regular rate and rhythm, no bruits or LE edema Abdomen:  abdomen soft, nontender; bowel sounds normal; no masses or organomegaly Genital: Deferred Neuro:  gait normal; deep tendon reflexes normal and symmetric; sensation intact to pinprick on both feet Psych: well oriented with normal range of affect and appropriate judgment/insight  Assessment and Plan  Well adult exam  Ascending aortic aneurysm (HCC), Chronic  Morbid (severe) obesity due to excess calories (HCC), Chronic  Type 2 diabetes mellitus with hyperglycemia, without long-term current use of insulin (HCC) - Plan: CBC, Comprehensive metabolic panel, Lipid panel, Hemoglobin A1c, Microalbumin / creatinine urine ratio   Well 70 y.o. female. Counseled on diet and exercise. Other orders as above. Follow up in 6 mo for DM visit or prn. The patient voiced understanding and agreement to the plan.  [address] [place], DO 01/24/21 9:09 AM

## 2021-02-13 ENCOUNTER — Other Ambulatory Visit: Payer: Self-pay | Admitting: Family Medicine

## 2021-02-20 ENCOUNTER — Other Ambulatory Visit: Payer: Self-pay

## 2021-02-20 ENCOUNTER — Encounter: Payer: Self-pay | Admitting: Internal Medicine

## 2021-02-20 ENCOUNTER — Ambulatory Visit (INDEPENDENT_AMBULATORY_CARE_PROVIDER_SITE_OTHER): Payer: Medicare PPO | Admitting: Internal Medicine

## 2021-02-20 VITALS — BP 154/86 | HR 76 | Ht 67.0 in | Wt 260.8 lb

## 2021-02-20 DIAGNOSIS — E785 Hyperlipidemia, unspecified: Secondary | ICD-10-CM | POA: Diagnosis not present

## 2021-02-20 DIAGNOSIS — E1165 Type 2 diabetes mellitus with hyperglycemia: Secondary | ICD-10-CM

## 2021-02-20 DIAGNOSIS — I428 Other cardiomyopathies: Secondary | ICD-10-CM

## 2021-02-20 DIAGNOSIS — I5042 Chronic combined systolic (congestive) and diastolic (congestive) heart failure: Secondary | ICD-10-CM | POA: Diagnosis not present

## 2021-02-20 DIAGNOSIS — I10 Essential (primary) hypertension: Secondary | ICD-10-CM

## 2021-02-20 NOTE — Progress Notes (Signed)
OFFICE NOTE  Chief Complaint:  Follow-up heart failure  Primary Care Physician: [name], DO  HPI:  Sharon Yu is a 70 y.o. female with a past medial history significant for hypertension, dyslipidemia, recent hospitalization for heart failure.  She was found to have a nonischemic cardiomyopathy with normal coronary arteries on cardiac catheterization in November 2018.  LVEF on echo was 25-30%.  She was started on carvedilol and losartan.  After discharge blood pressure remained elevated and her losartan was switched to olmesartan.  Carvedilol was then increased.  She reports she is doing well on the medications.  Initially blood pressure was elevated today however repeat came down to 124/78.  Weight is within a few pounds where it had been previously.  She denies any new or worsening edema.  She denies any chest pain.  She reports that she is going to start to exercise more with silver sneakers.  She attends the [employer] in [facility].  She was also found to have a 4.3 cm ascending aortic aneurysm.  This will need to be reimaged in approximately 1 year.  04/08/2018  Sharon Yu returns today for heart failure follow-up.  We recently performed a repeat echocardiogram, she indicates improvement in LVEF from 25 to 30% up to 40 to 45%.  She is tolerating the addition of carvedilol and is on losartan.  She also takes low-dose aspirin, atorvastatin and furosemide 40 mg daily.  She would does report symptomatic improvement and currently endorses NYHA class II symptoms.  Echo shows stability of her 4.3 cm ascending aortic aneurysm and we will recommend following this annually.  We may consider a CT scan of the aorta during the next study to more fully visualize the aortic arch and proximal descending aorta.  07/09/2018  Sharon Yu is seen today for follow-up heart failure.  Recently her LVEF improved from 25 to 30% up to 40 to 45%.  She is on moderate dose Entresto.  She endorses no  more than NYHA class II symptoms.  She was noted to have a 4.3 cm ascending aortic aneurysm which will need routine follow-up.  Her last echo was in July 2019.  She is had about 6 pound weight loss this year.  She is on moderate dose Lasix.  She seems to be tolerating medications and notes her blood pressure at home is around the 120s over low 70s.  I think there is room to increase her Entresto further.  She says it cost her about $80 for a 39-month supply which she said is affordable at this time and she is not receiving patient assistance because she did not qualify financially.  04/28/2019  Sharon Yu seen today for heart failure follow-up.  Overall she says she is feeling well.  She had a recent echo which showed a small interval improvement in LV function up to 45 to 50%.  She has had escalating doses of Entresto now on max dose 97/103 mg twice daily.  She is also on carvedilol 6.25 mg twice daily.  Overall she endorses NYHA class I-II symptoms.  EKG today shows sinus rhythm at 79.  11/03/2019  Sharon Yu returns today for follow-up.  Sadly she reports that her brother died today.  She says she was with family but because she did not want to miss the appointment came to the office.  She was interested in follow-up of her echo which shows a stable LVEF 45 to 50% but no significant improvement despite maximizing the  dose of her Entresto.  Blood pressure would not likely allow increases in her medication.  Heart rate was elevated today however she was rushed to get here and under stress.  He says in general her blood pressure is in the low 100s and heart rate in the 60s.  06/19/2020  Sharon Yu is seen today in follow-up.  She had an echo earlier this year in January 2021 which showed fairly stable LVEF of 45 to 50%.  This is a nonischemic cardiomyopathy with normal coronaries by cath in November 2018.  She is now on maximum dose Entresto of 97/103 mg twice daily.  She is also recently had an  increase in carvedilol to 12.5 mg twice daily by her PCP for persistently elevated blood pressure.  Today is 157/89 and in general she says it sits around 140/90.  This very well may be a hypertensive cardiomyopathy.  At present she has no more than class II heart failure symptoms.  Weight is still an issue with morbid obesity and a BMI of 40.  She says she does sleep well at night denies snoring but has never had sleep study.  EKG shows sinus rhythm.  Labs from March 2021 showed total cholesterol 111, HDL 43, LDL 45 and triglycerides 109.  11/16/2020  Sharon Yu is seen today for follow-up.  Overall she is doing well.  Blood pressure was again elevated today 161/93.  She says at home it is lower however typically not better than 140 systolic.  We had repeated an echo recently and unfortunately despite maximal treatment with Entresto her LVEF is slightly lower at 40 to 45%.  This may be related to interpretation however I feel that with her blood pressure still poorly controlled that that is contributing to this.  Overall she has likely NYHA class II symptoms.  02/20/2021  Sharon Yu is seen today in follow-up of her heart failure.  Unfortunately she never started the Jardiance because she has concerns about side effects and was not able to afford the medication.  It also appears that she was noncompliant with her statin.  Her cholesterol has more than doubled with total cholesterol now 203 up from 111 and LDL cholesterol 126 up from 45.  She initially said that she was not compliant with her diet and had missed a few doses of her medicine and then subsequently said she stopped it completely and was trying over-the-counter red yeast rice.  She has now gone back to 40 mg daily atorvastatin.  PMHx:  Past Medical History:  Diagnosis Date   Arthritis    Back pain    buldging disc   Chronic back pain    Hyperlipidemia    takes Crestor daily   Hypertension    takes Maxzide and Metoprolol daily along  with Ramipril   Hypopotassemia    takes Potassium pill daily   Joint pain    Joint swelling    Nonischemic cardiomyopathy (HCC)    a. 07/2017: EF reduced to 25-30%, cath showing normal cors.    Shortness of breath    with exertion    Past Surgical History:  Procedure Laterality Date   bilateral knee arthroscopies     carpel tunnel     bilateral   CHOLECYSTECTOMY     colonscopy     JOINT REPLACEMENT Left    RIGHT/LEFT HEART CATH AND CORONARY ANGIOGRAPHY N/A 07/17/2017   Procedure: RIGHT/LEFT HEART CATH AND CORONARY ANGIOGRAPHY;  Surgeon: Swaziland, Peter M, MD;  Location: [facility]  INVASIVE CV LAB;  Service: Cardiovascular;  Laterality: N/A;   TOTAL KNEE ARTHROPLASTY Right 02/19/2013   Procedure: TOTAL KNEE ARTHROPLASTY;  Surgeon: Thera Flake., MD;  Location: [facility] OR;  Service: Orthopedics;  Laterality: Right;  right total knee arthroplasty   TOTAL KNEE REVISION Left 07/02/2013   Procedure: LEFT TOTAL KNEE TIBIA REVISION;  Surgeon: Nestor Lewandowsky, MD;  Location: [facility] OR;  Service: Orthopedics;  Laterality: Left;    FAMHx:  Family History  Problem Relation Age of Onset   Hypertension Mother    Arthritis Mother    Asthma Mother    Diabetes Mother    Hyperlipidemia Mother    Cancer Mother        uterine cancer   CAD Sister    Heart failure Sister    Hyperlipidemia Sister    Heart disease Sister    Early death Father    Asthma Brother    COPD Brother    Depression Brother    AAA (abdominal aortic aneurysm) Daughter    Hearing loss Daughter    Early death Daughter 33       Spinal miningitis    SOCHx:   reports that she has never smoked. She has never used smokeless tobacco. She reports that she does not drink alcohol and does not use drugs.  ALLERGIES:  Allergies  Allergen Reactions   Other Hives and Swelling    "Grapple" (hybrid apple & grape)    ROS: Pertinent items noted in HPI and remainder of comprehensive ROS otherwise negative.  HOME MEDS: Current Outpatient  Medications on File Prior to Visit  Medication Sig Dispense Refill   alendronate (FOSAMAX) 70 MG tablet Take 1 tablet (70 mg total) by mouth every 7 (seven) days. Saturdays. Take with a full glass of water on an empty stomach. 12 tablet 2   aspirin 81 MG chewable tablet Chew 81 mg daily by mouth.     atorvastatin (LIPITOR) 40 MG tablet Take 1 tablet (40 mg total) by mouth daily. 90 tablet 0   carvedilol (COREG) 25 MG tablet Take 1 tablet (25 mg total) by mouth 2 (two) times daily with a meal. 180 tablet 3   ENTRESTO 97-103 MG Take 1 tablet by mouth twice daily 180 tablet 2   furosemide (LASIX) 40 MG tablet Take 1 tablet by mouth once daily 90 tablet 0   hydrALAZINE (APRESOLINE) 25 MG tablet Take 1 tablet (25 mg total) by mouth in the morning and at bedtime. 180 tablet 3   meloxicam (MOBIC) 15 MG tablet TAKE 1 TABLET BY MOUTH ONCE DAILY AS NEEDED FOR PAIN 30 tablet 0   metFORMIN (GLUCOPHAGE) 500 MG tablet Take 1 tablet by mouth once daily with breakfast 90 tablet 0   Multiple Vitamin (MULITIVITAMIN WITH MINERALS) TABS Take 1 tablet by mouth daily.     Potassium (POTASSIMIN PO) Take by mouth.     Vitamin D3 (VITAMIN D) 25 MCG tablet Take 1,000 Units by mouth daily.     vitamin E 180 MG (400 UNITS) capsule Take 400 Units by mouth daily.     No current facility-administered medications on file prior to visit.    LABS/IMAGING: No results found for this or any previous visit (from the past 48 hour(s)). No results found.  LIPID PANEL:    Component Value Date/Time   CHOL 203 (H) 01/24/2021 0910   TRIG 171.0 (H) 01/24/2021 0910   HDL 42.30 01/24/2021 0910   CHOLHDL 5 01/24/2021 0910  VLDL 34.2 01/24/2021 0910   LDLCALC 126 (H) 01/24/2021 0910     WEIGHTS: Wt Readings from Last 3 Encounters:  02/20/21 260 lb 12.8 oz (118.3 kg)  01/24/21 260 lb 2 oz (118 kg)  12/20/20 257 lb (116.6 kg)    VITALS: BP (!) 154/86 (BP Location: Left Arm, Patient Position: Sitting, Cuff Size: Normal)    Pulse 76   Ht 5\' 7"  (1.702 m)   Wt 260 lb 12.8 oz (118.3 kg)   SpO2 97%   BMI 40.85 kg/m   EXAM: General appearance: alert, no distress and morbidly obese Neck: no carotid bruit, no JVD and thyroid not enlarged, symmetric, no tenderness/mass/nodules Lungs: clear to auscultation bilaterally Heart: regular rate and rhythm, S1, S2 normal, no murmur, click, rub or gallop Abdomen: soft, non-tender; bowel sounds normal; no masses,  no organomegaly Extremities: extremities normal, atraumatic, no cyanosis or edema Pulses: 2+ and symmetric Skin: Skin color, texture, turgor normal. No rashes or lesions Neurologic: Grossly normal Psych: Pleasant  EKG: Normal sinus rhythm at 76-personally reviewed  ASSESSMENT: Chronic systolic congestive heart failure-NYHA class II symptoms, LVEF 40-45% (10/2020) Nonischemic cardiomyopathy, normal coronary arteries by heart catheterization 07/2017 Hypertension Dyslipidemia Morbid obesity 4.3 cm ascending aortic aneurysm - measured 3.8 by echo (09/2018 and normal in 09/2019) Borderline diabetes-A1c 6.0  PLAN: 1.   Sharon Yu has had stable if not slightly worse LVEF based on her last echo.  She is on max dose Entresto as well as carvedilol.  I had recommended an SGLT2 inhibitor however she would not interested in it.  We discussed also the possibility of adding Aldactone however she would not like any additional medications at this time.  She was noncompliant with her atorvastatin but says she is restarting it and her PCP is already ordered a follow-up lipid.  We will plan a repeat echo in February 2023 and follow-up with me at that time  March 2023, MD, [facility], FACP  [place]  [facility]  Medical Director of the Advanced Lipid Disorders &  Cardiovascular Risk Reduction Clinic Diplomate of the American Board of Clinical Lipidology Attending Cardiologist  Direct Dial: [phone]  Fax: [phone]  Website:  [email or web address] 02/20/2021, 11:33 AM

## 2021-02-20 NOTE — Patient Instructions (Signed)
Medication Instructions:  No Changes In Medications at this time.  *If you need a refill on your cardiac medications before your next appointment, please call your pharmacy*  Testing/Procedures: Your physician has requested that you have an echocardiogram IN FEBRUARY 2022. Echocardiography is a painless test that uses sound waves to create images of your heart. It provides your doctor with information about the size and shape of your heart and how well your heart's chambers and valves are working. You may receive an ultrasound enhancing agent through an IV if needed to better visualize your heart during the echo.This procedure takes approximately one hour. There are no restrictions for this procedure. This will take place at the [address].   Follow-Up: At [facility], you and your health needs are our priority.  As part of our continuing mission to provide you with exceptional heart care, we have created designated Provider Care Teams.  These Care Teams include your primary Cardiologist (physician) and Advanced Practice Providers (APPs -  Physician Assistants and Nurse Practitioners) who all work together to provide you with the care you need, when you need it.  We recommend signing up for the patient portal called "MyChart".  Sign up information is provided on this After Visit Summary.  MyChart is used to connect with patients for Virtual Visits (Telemedicine).  Patients are able to view lab/test results, encounter notes, upcoming appointments, etc.  Non-urgent messages can be sent to your provider as well.   To learn more about what you can do with MyChart, go to [email or web address].    Your next appointment:   8-9 MONTHS- RIGHT AFTER ECHOCARDIOGRAM   The format for your next appointment:   In Person  Provider:   K. [place] Hilty, MD

## 2021-03-02 ENCOUNTER — Other Ambulatory Visit: Payer: Self-pay | Admitting: Family Medicine

## 2021-03-09 ENCOUNTER — Other Ambulatory Visit: Payer: Self-pay

## 2021-03-09 ENCOUNTER — Other Ambulatory Visit (INDEPENDENT_AMBULATORY_CARE_PROVIDER_SITE_OTHER): Payer: Medicare PPO

## 2021-03-09 DIAGNOSIS — E785 Hyperlipidemia, unspecified: Secondary | ICD-10-CM

## 2021-03-09 LAB — LIPID PANEL
Cholesterol: 126 mg/dL (ref 0–200)
HDL: 42.7 mg/dL (ref >39.00)
LDL Cholesterol: 56 mg/dL (ref 0–99)
NonHDL: 83.51
Total CHOL/HDL Ratio: 3
Triglycerides: 140 mg/dL (ref 0.0–149.0)
VLDL: 28 mg/dL (ref 0.0–40.0)

## 2021-04-25 ENCOUNTER — Other Ambulatory Visit: Payer: Self-pay | Admitting: Family Medicine

## 2021-06-15 ENCOUNTER — Other Ambulatory Visit: Payer: Self-pay | Admitting: Internal Medicine

## 2021-07-02 ENCOUNTER — Other Ambulatory Visit: Payer: Self-pay | Admitting: Family Medicine

## 2021-07-30 ENCOUNTER — Ambulatory Visit: Payer: Medicare PPO | Admitting: Family Medicine

## 2021-08-01 ENCOUNTER — Ambulatory Visit: Payer: Medicare PPO | Admitting: Family Medicine

## 2021-08-03 ENCOUNTER — Ambulatory Visit: Payer: Medicare PPO | Admitting: Family Medicine

## 2021-08-03 ENCOUNTER — Encounter: Payer: Self-pay | Admitting: Family Medicine

## 2021-08-03 VITALS — BP 142/82 | HR 86 | Temp 98.1°F | Ht 66.0 in | Wt 256.0 lb

## 2021-08-03 DIAGNOSIS — M810 Age-related osteoporosis without current pathological fracture: Secondary | ICD-10-CM | POA: Diagnosis not present

## 2021-08-03 DIAGNOSIS — E1165 Type 2 diabetes mellitus with hyperglycemia: Secondary | ICD-10-CM

## 2021-08-03 MED ORDER — ATORVASTATIN CALCIUM 40 MG PO TABS: 40.0000 mg | ORAL_TABLET | Freq: Every day | ORAL | 3 refills | Status: DC

## 2021-08-03 MED ORDER — ALENDRONATE SODIUM 70 MG PO TABS: 70.0000 mg | ORAL_TABLET | ORAL | 2 refills | Status: DC

## 2021-08-03 MED ORDER — FLUTICASONE PROPIONATE 50 MCG/ACT NA SUSP: 2.0000 | Freq: Every day | NASAL | 2 refills | Status: DC

## 2021-08-03 NOTE — Patient Instructions (Signed)
Give [place] 2-3 business days to get the results of your labs back.   Keep the diet clean and stay active.  Take your Fosamax weekly.   Go back the Flonase daily.   Let us know if you need anything.

## 2021-08-03 NOTE — Progress Notes (Signed)
Subjective:   Chief Complaint  Patient presents with   Follow-up    6 month    Sharon Yu is a 70 y.o. female here for follow-up of diabetes.   Sharon Yu does not routinely check her sugars.  Patient does not require insulin.   Medications include: Metformin 500 mg/d Diet is OK.  Exercise: lifting, cycling No Cp or SOB.  Osteoporosis Rx'd Fosamax 70 mg/d. Has not been taking. Diet/exercise as above. Is taking Ca and Vit D.   Past Medical History:  Diagnosis Date   Arthritis    Back pain    buldging disc   Chronic back pain    Hyperlipidemia    takes Crestor daily   Hypertension    takes Maxzide and Metoprolol daily along with Ramipril   Hypopotassemia    takes Potassium pill daily   Joint pain    Joint swelling    Nonischemic cardiomyopathy (HCC)    a. 07/2017: EF reduced to 25-30%, cath showing normal cors.    Shortness of breath    with exertion     Related testing: Retinal exam: Done Pneumovax: not done  Objective:  BP (!) 142/82   Pulse 86   Temp 98.1 F (36.7 C) (Oral)   Ht 5\' 6"  (1.676 m)   Wt 256 lb (116.1 kg)   SpO2 98%   BMI 41.32 kg/m  General:  Well developed, well nourished, in no apparent distress Skin:  Warm, no pallor or diaphoresis Head:  Normocephalic, atraumatic Eyes:  Pupils equal and round, sclera anicteric without injection  Lungs:  CTAB, no access msc use Cardio:  RRR, no bruits, no LE edema Psych: Age appropriate judgment and insight  Assessment:   Type 2 diabetes mellitus with hyperglycemia, without long-term current use of insulin (HCC)  Age-related osteoporosis without current pathological fracture   Plan:   Chronic, stable. Cont Metformin 500 mg/d. Counseled on diet and exercise. Chronic, stable. Cont Vit D and Ca. Restart Fosamax 70 mg/week.  F/u in 6 mo for CPE or prn. The patient voiced understanding and agreement to the plan.  [place], DO 08/03/21 1:36 PM

## 2021-08-03 NOTE — Addendum Note (Signed)
Addended by: Rosita Kea on: 08/03/2021 03:15 PM   Modules accepted: Orders

## 2021-08-04 LAB — LIPID PANEL
Cholesterol: 120 mg/dL (ref <200)
HDL: 43 mg/dL — ABNORMAL LOW (ref >=50)
LDL Cholesterol (Calc): 58 mg/dL (calc)
Non-HDL Cholesterol (Calc): 77 mg/dL (calc) (ref <130)
Total CHOL/HDL Ratio: 2.8 (calc) (ref <5.0)
Triglycerides: 100 mg/dL (ref <150)

## 2021-08-04 LAB — COMPREHENSIVE METABOLIC PANEL
AG Ratio: 1.3 (calc) (ref 1.0–2.5)
ALT: 12 U/L (ref 6–29)
AST: 12 U/L (ref 10–35)
Albumin: 3.9 g/dL (ref 3.6–5.1)
Alkaline phosphatase (APISO): 95 U/L (ref 37–153)
BUN: 18 mg/dL (ref 7–25)
CO2: 27 mmol/L (ref 20–32)
Calcium: 8.8 mg/dL (ref 8.6–10.4)
Chloride: 104 mmol/L (ref 98–110)
Creat: 0.94 mg/dL (ref 0.50–1.05)
Globulin: 2.9 g/dL (calc) (ref 1.9–3.7)
Glucose, Bld: 92 mg/dL (ref 65–99)
Potassium: 3.8 mmol/L (ref 3.5–5.3)
Sodium: 144 mmol/L (ref 135–146)
Total Bilirubin: 0.4 mg/dL (ref 0.2–1.2)
Total Protein: 6.8 g/dL (ref 6.1–8.1)

## 2021-08-04 LAB — VITAMIN D 25 HYDROXY (VIT D DEFICIENCY, FRACTURES): Vit D, 25-Hydroxy: 43 ng/mL (ref 30–100)

## 2021-08-04 LAB — HEMOGLOBIN A1C
Hgb A1c MFr Bld: 6 % of total Hgb — ABNORMAL HIGH (ref <5.7)
Mean Plasma Glucose: 126 mg/dL
eAG (mmol/L): 7 mmol/L

## 2021-09-17 ENCOUNTER — Other Ambulatory Visit: Payer: Self-pay | Admitting: Family Medicine

## 2021-09-17 ENCOUNTER — Other Ambulatory Visit: Payer: Self-pay | Admitting: Internal Medicine

## 2021-10-06 ENCOUNTER — Other Ambulatory Visit: Payer: Self-pay | Admitting: Family Medicine

## 2021-10-15 ENCOUNTER — Ambulatory Visit (HOSPITAL_COMMUNITY): Payer: Medicare PPO | Attending: Internal Medicine

## 2021-10-15 ENCOUNTER — Encounter (INDEPENDENT_AMBULATORY_CARE_PROVIDER_SITE_OTHER): Payer: Self-pay

## 2021-10-15 ENCOUNTER — Other Ambulatory Visit: Payer: Self-pay

## 2021-10-15 DIAGNOSIS — I428 Other cardiomyopathies: Secondary | ICD-10-CM | POA: Diagnosis not present

## 2021-10-15 LAB — ECHOCARDIOGRAM COMPLETE
Area-P 1/2: 5.2 cm2
P 1/2 time: 621 msec
S' Lateral: 3.5 cm

## 2021-10-15 MED ORDER — PERFLUTREN LIPID MICROSPHERE
1.0000 mL | INTRAVENOUS | Status: AC | PRN
  Administered 2021-10-15: 3 mL via INTRAVENOUS

## 2021-10-24 ENCOUNTER — Other Ambulatory Visit: Payer: Self-pay | Admitting: Family Medicine

## 2021-12-25 ENCOUNTER — Other Ambulatory Visit (HOSPITAL_BASED_OUTPATIENT_CLINIC_OR_DEPARTMENT_OTHER): Payer: Self-pay | Admitting: Family Medicine

## 2021-12-25 ENCOUNTER — Other Ambulatory Visit: Payer: Self-pay | Admitting: Internal Medicine

## 2021-12-25 ENCOUNTER — Other Ambulatory Visit: Payer: Self-pay | Admitting: Family Medicine

## 2021-12-25 DIAGNOSIS — Z1239 Encounter for other screening for malignant neoplasm of breast: Secondary | ICD-10-CM

## 2021-12-27 ENCOUNTER — Ambulatory Visit (INDEPENDENT_AMBULATORY_CARE_PROVIDER_SITE_OTHER): Payer: Medicare PPO

## 2021-12-27 DIAGNOSIS — Z780 Asymptomatic menopausal state: Secondary | ICD-10-CM | POA: Diagnosis not present

## 2021-12-27 DIAGNOSIS — Z0000 Encounter for general adult medical examination without abnormal findings: Secondary | ICD-10-CM

## 2021-12-27 NOTE — Progress Notes (Signed)
? ?Subjective:  ? Sharon Yu is a 71 y.o. female who presents for Medicare Annual (Subsequent) preventive examination. ? ?I connected with  Ignacia Felling on 12/27/21 by a audio enabled telemedicine application and verified that I am speaking with the correct person using two identifiers. ? ?Patient Location: Home ? ?Provider Location: Office/Clinic ? ?I discussed the limitations of evaluation and management by telemedicine. The patient expressed understanding and agreed to proceed.  ? ?Review of Systems    ? ?Cardiac Risk Factors include: advanced age (>9men, >88 women);diabetes mellitus;dyslipidemia;hypertension ? ?   ?Objective:  ?  ?Today's Vitals  ? 12/27/21 1026  ?PainSc: 4   ? ?There is no height or weight on file to calculate BMI. ? ? ?  12/27/2021  ? 10:24 AM 12/20/2020  ? 10:30 AM 11/22/2019  ? 11:41 AM 05/29/2018  ?  5:24 PM 07/15/2017  ? 12:57 AM 07/02/2013  ?  9:44 AM 06/25/2013  ? 10:12 AM  ?Advanced Directives  ?Does Patient Have a Medical Advance Directive? Yes No No No No Patient does not have advance directive;Patient would not like information Patient does not have advance directive;Patient would not like information  ?Type of Estate agent of [place];Out of facility DNR (pink MOST or yellow form);Living will        ?Copy of Healthcare Power of Attorney in Chart? Yes - validated most recent copy scanned in chart (See row information)        ?Would patient like information on creating a medical advance directive?  Yes (MAU/Ambulatory/Procedural Areas - Information given) No - Patient declined No - Patient declined No - Patient declined    ? ? ?Current Medications (verified) ?Outpatient Encounter Medications as of 12/27/2021  ?Medication Sig  ? alendronate (FOSAMAX) 70 MG tablet Take 1 tablet (70 mg total) by mouth every 7 (seven) days. Saturdays. Take with a full glass of water on an empty stomach.  ? aspirin 81 MG chewable tablet Chew 81 mg daily by mouth.  ?  atorvastatin (LIPITOR) 40 MG tablet Take 1 tablet (40 mg total) by mouth daily.  ? carvedilol (COREG) 25 MG tablet TAKE 1 TABLET BY MOUTH TWICE DAILY WITH A MEAL  ? ENTRESTO 97-103 MG Take 1 tablet by mouth twice daily  ? fluticasone (FLONASE) 50 MCG/ACT nasal spray Place 2 sprays into both nostrils daily.  ? furosemide (LASIX) 40 MG tablet Take 1 tablet by mouth once daily  ? hydrALAZINE (APRESOLINE) 25 MG tablet TAKE 1 TABLET BY MOUTH IN THE MORNING AND AT BEDTIME  ? meloxicam (MOBIC) 15 MG tablet TAKE 1 TABLET BY MOUTH ONCE DAILY AS NEEDED FOR PAIN  ? metFORMIN (GLUCOPHAGE) 500 MG tablet Take 1 tablet by mouth once daily with breakfast  ? Multiple Vitamin (MULITIVITAMIN WITH MINERALS) TABS Take 1 tablet by mouth daily.  ? Potassium (POTASSIMIN PO) Take by mouth.  ? Vitamin D3 (VITAMIN D) 25 MCG tablet Take 1,000 Units by mouth daily.  ? vitamin E 180 MG (400 UNITS) capsule Take 400 Units by mouth daily.  ? ?No facility-administered encounter medications on file as of 12/27/2021.  ? ? ?Allergies (verified) ?Other  ? ?History: ?Past Medical History:  ?Diagnosis Date  ? Arthritis   ? Back pain   ? buldging disc  ? Chronic back pain   ? Hyperlipidemia   ? takes Crestor daily  ? Hypertension   ? takes Maxzide and Metoprolol daily along with Ramipril  ? Hypopotassemia   ? takes Potassium  pill daily  ? Joint pain   ? Joint swelling   ? Nonischemic cardiomyopathy (HCC)   ? a. 07/2017: EF reduced to 25-30%, cath showing normal cors.   ? Shortness of breath   ? with exertion  ? ?Past Surgical History:  ?Procedure Laterality Date  ? bilateral knee arthroscopies    ? carpel tunnel    ? bilateral  ? CHOLECYSTECTOMY    ? colonscopy    ? JOINT REPLACEMENT Left   ? RIGHT/LEFT HEART CATH AND CORONARY ANGIOGRAPHY N/A 07/17/2017  ? Procedure: RIGHT/LEFT HEART CATH AND CORONARY ANGIOGRAPHY;  Surgeon: Swaz[place], Peter M, MD;  Location: [facility] INVASIVE CV LAB;  Service: Cardiovascular;  Laterality: N/A;  ? TOTAL KNEE ARTHROPLASTY Right  02/19/2013  ? Procedure: TOTAL KNEE ARTHROPLASTY;  Surgeon: Thera Flake., MD;  Location: [facility] OR;  Service: Orthopedics;  Laterality: Right;  right total knee arthroplasty  ? TOTAL KNEE REVISION Left 07/02/2013  ? Procedure: LEFT TOTAL KNEE TIBIA REVISION;  Surgeon: Nestor Lewandowsky, MD;  Location: [facility] OR;  Service: Orthopedics;  Laterality: Left;  ? ?Family History  ?Problem Relation Age of Onset  ? Hypertension Mother   ? Arthritis Mother   ? Asthma Mother   ? Diabetes Mother   ? Hyperlipidemia Mother   ? Cancer Mother   ?     uterine cancer  ? CAD Sister   ? Heart failure Sister   ? Hyperlipidemia Sister   ? Heart disease Sister   ? Early death Father   ? Asthma Brother   ? COPD Brother   ? Depression Brother   ? AAA (abdominal aortic aneurysm) Daughter   ? Hearing loss Daughter   ? Early death Daughter 29  ?     Spinal miningitis  ? ?Social History  ? ?Socioeconomic History  ? Marital status: Widowed  ?  Spouse name: Not on file  ? Number of children: Not on file  ? Years of education: Not on file  ? Highest education level: Not on file  ?Occupational History  ? Occupation: retired  ?Tobacco Use  ? Smoking status: Never  ? Smokeless tobacco: Never  ?Substance and Sexual Activity  ? Alcohol use: No  ? Drug use: No  ? Sexual activity: Yes  ?  Birth control/protection: Post-menopausal  ?Other Topics Concern  ? Not on file  ?Social History Narrative  ? Not on file  ? ?Social Determinants of Health  ? ?Financial Resource Strain: Low Risk   ? Difficulty of Paying Living Expenses: Not hard at all  ?Food Insecurity: No Food Insecurity  ? Worried About Programme researcher, broadcasting/film/video in the Last Year: Never true  ? Ran Out of Food in the Last Year: Never true  ?Transportation Needs: No Transportation Needs  ? Lack of Transportation (Medical): No  ? Lack of Transportation (Non-Medical): No  ?Physical Activity: Not on file  ?Stress: No Stress Concern Present  ? Feeling of Stress : Not at all  ?Social Connections: Moderately Integrated  ?  Frequency of Communication with Friends and Family: More than three times a week  ? Frequency of Social Gatherings with Friends and Family: More than three times a week  ? Attends Religious Services: More than 4 times per year  ? Active Member of Clubs or Organizations: Yes  ? Attends Banker Meetings: More than 4 times per year  ? Marital Status: Widowed  ? ? ?Tobacco Counseling ?Counseling given: Not Answered ? ? ?Clinical Intake: ? ?  Pre-visit preparation completed: Yes ? ?Pain : 0-10 ?Pain Score: 4  ?Pain Type: Chronic pain ?Pain Location: Back ?Pain Orientation: Lower ?Pain Descriptors / Indicators: Aching, Dull, Sore ?Pain Onset: More than a month ago ?Pain Frequency: Occasional ? ?  ? ?Nutritional Risks: None ?Diabetes: Yes ?CBG done?: No ?Did pt. bring in CBG monitor from home?: No ? ?How often do you need to have someone help you when you read instructions, pamphlets, or other written materials from your doctor or pharmacy?: 1 - Never ? ?Diabetic?Yes ?Nutrition Risk Assessment: ? ?Has the patient had any N/V/D within the last 2 months?  No  ?Does the patient have any non-healing wounds?  No  ?Has the patient had any unintentional weight loss or weight gain?  No  ? ?Diabetes: ? ?Is the patient diabetic?  Yes  ?If diabetic, was a CBG obtained today?  No  ?Did the patient bring in their glucometer from home?  No  ?How often do you monitor your CBG's? N/A doesn't check it.  ? ?Financial Strains and Diabetes Management: ? ?Are you having any financial strains with the device, your supplies or your medication? No .  ?Does the patient want to be seen by Chronic Care Management for management of their diabetes?  No  ?Would the patient like to be referred to a Nutritionist or for Diabetic Management?  No  ? ?Diabetic Exams: ? ?Diabetic Eye Exam: Overdue for diabetic eye exam. Pt has been advised about the importance in completing this exam. Patient advised to call and schedule an eye exam. ?Diabetic Foot  Exam: Completed 01/24/21   ? ?Interpreter Needed?: No ? ?Information entered by :: Johnanthony Wilden ? ? ?Activities of Daily Living ? ?  12/27/2021  ? 10:29 AM  ?In your present state of health, do you have any difficult

## 2021-12-27 NOTE — Patient Instructions (Signed)
Sharon Yu , ?Thank you for taking time to come for your Medicare Wellness Visit. I appreciate your ongoing commitment to your health goals. Please review the following plan we discussed and let me know if I can assist you in the future.  ? ?Screening recommendations/referrals: ?Colonoscopy: 11/23/13 due 11/24/23 ?Mammogram: 01/08/21 due 01/08/22 ?Bone Density: ordered 12/27/21 ?Recommended yearly ophthalmology/optometry visit for glaucoma screening and checkup ?Recommended yearly dental visit for hygiene and checkup ? ?Vaccinations: ?Influenza vaccine: up to date ?Pneumococcal vaccine: up to date ?Tdap vaccine: up to date ?Shingles vaccine: up to date   ?Covid-19:completed ? ?Advanced directives: yes, on file ? ?Conditions/risks identified: see problem list  ? ?Next appointment: Follow up in one year for your annual wellness visit  ? ? ?Preventive Care 104 Years and Older, Female ?Preventive care refers to lifestyle choices and visits with your health care provider that can promote health and wellness. ?What does preventive care include? ?A yearly physical exam. This is also called an annual well check. ?Dental exams once or twice a year. ?Routine eye exams. Ask your health care provider how often you should have your eyes checked. ?Personal lifestyle choices, including: ?Daily care of your teeth and gums. ?Regular physical activity. ?Eating a healthy diet. ?Avoiding tobacco and drug use. ?Limiting alcohol use. ?Practicing safe sex. ?Taking low-dose aspirin every day. ?Taking vitamin and mineral supplements as recommended by your health care provider. ?What happens during an annual well check? ?The services and screenings done by your health care provider during your annual well check will depend on your age, overall health, lifestyle risk factors, and family history of disease. ?Counseling  ?Your health care provider may ask you questions about your: ?Alcohol use. ?Tobacco use. ?Drug use. ?Emotional well-being. ?Home and  relationship well-being. ?Sexual activity. ?Eating habits. ?History of falls. ?Memory and ability to understand (cognition). ?Work and work Astronomer. ?Reproductive health. ?Screening  ?You may have the following tests or measurements: ?Height, weight, and BMI. ?Blood pressure. ?Lipid and cholesterol levels. These may be checked every 5 years, or more frequently if you are over 8 years old. ?Skin check. ?Lung cancer screening. You may have this screening every year starting at age 3 if you have a 30-pack-year history of smoking and currently smoke or have quit within the past 15 years. ?Fecal occult blood test (FOBT) of the stool. You may have this test every year starting at age 51. ?Flexible sigmoidoscopy or colonoscopy. You may have a sigmoidoscopy every 5 years or a colonoscopy every 10 years starting at age 96. ?Hepatitis C blood test. ?Hepatitis B blood test. ?Sexually transmitted disease (STD) testing. ?Diabetes screening. This is done by checking your blood sugar (glucose) after you have not eaten for a while (fasting). You may have this done every 1-3 years. ?Bone density scan. This is done to screen for osteoporosis. You may have this done starting at age 69. ?Mammogram. This may be done every 1-2 years. Talk to your health care provider about how often you should have regular mammograms. ?Talk with your health care provider about your test results, treatment options, and if necessary, the need for more tests. ?Vaccines  ?Your health care provider may recommend certain vaccines, such as: ?Influenza vaccine. This is recommended every year. ?Tetanus, diphtheria, and acellular pertussis (Tdap, Td) vaccine. You may need a Td booster every 10 years. ?Zoster vaccine. You may need this after age 42. ?Pneumococcal 13-valent conjugate (PCV13) vaccine. One dose is recommended after age 58. ?Pneumococcal polysaccharide (PPSV23) vaccine. One  dose is recommended after age 62. ?Talk to your health care provider  about which screenings and vaccines you need and how often you need them. ?This information is not intended to replace advice given to you by your health care provider. Make sure you discuss any questions you have with your health care provider. ?Document Released: 09/15/2015 Document Revised: 05/08/2016 Document Reviewed: 06/20/2015 ?Elsevier Interactive Patient Education ? 2017 [place]. ? ?Fall Prevention in the Home ?Falls can cause injuries. They can happen to people of all ages. There are many things you can do to make your home safe and to help prevent falls. ?What can I do on the outside of my home? ?Regularly fix the edges of walkways and driveways and fix any cracks. ?Remove anything that might make you trip as you walk through a door, such as a raised step or threshold. ?Trim any bushes or trees on the path to your home. ?Use bright outdoor lighting. ?Clear any walking paths of anything that might make someone trip, such as rocks or tools. ?Regularly check to see if handrails are loose or broken. Make sure that both sides of any steps have handrails. ?Any raised decks and porches should have guardrails on the edges. ?Have any leaves, snow, or ice cleared regularly. ?Use sand or salt on walking paths during winter. ?Clean up any spills in your garage right away. This includes oil or grease spills. ?What can I do in the bathroom? ?Use night lights. ?Install grab bars by the toilet and in the tub and shower. Do not use towel bars as grab bars. ?Use non-skid mats or decals in the tub or shower. ?If you need to sit down in the shower, use a plastic, non-slip stool. ?Keep the floor dry. Clean up any water that spills on the floor as soon as it happens. ?Remove soap buildup in the tub or shower regularly. ?Attach bath mats securely with double-sided non-slip rug tape. ?Do not have throw rugs and other things on the floor that can make you trip. ?What can I do in the bedroom? ?Use night lights. ?Make sure  that you have a light by your bed that is easy to reach. ?Do not use any sheets or blankets that are too big for your bed. They should not hang down onto the floor. ?Have a firm chair that has side arms. You can use this for support while you get dressed. ?Do not have throw rugs and other things on the floor that can make you trip. ?What can I do in the kitchen? ?Clean up any spills right away. ?Avoid walking on wet floors. ?Keep items that you use a lot in easy-to-reach places. ?If you need to reach something above you, use a strong step stool that has a grab bar. ?Keep electrical cords out of the way. ?Do not use floor polish or wax that makes floors slippery. If you must use wax, use non-skid floor wax. ?Do not have throw rugs and other things on the floor that can make you trip. ?What can I do with my stairs? ?Do not leave any items on the stairs. ?Make sure that there are handrails on both sides of the stairs and use them. Fix handrails that are broken or loose. Make sure that handrails are as long as the stairways. ?Check any carpeting to make sure that it is firmly attached to the stairs. Fix any carpet that is loose or worn. ?Avoid having throw rugs at the top or bottom of the  stairs. If you do have throw rugs, attach them to the floor with carpet tape. ?Make sure that you have a light switch at the top of the stairs and the bottom of the stairs. If you do not have them, ask someone to add them for you. ?What else can I do to help prevent falls? ?Wear shoes that: ?Do not have high heels. ?Have rubber bottoms. ?Are comfortable and fit you well. ?Are closed at the toe. Do not wear sandals. ?If you use a stepladder: ?Make sure that it is fully opened. Do not climb a closed stepladder. ?Make sure that both sides of the stepladder are locked into place. ?Ask someone to hold it for you, if possible. ?Clearly mark and make sure that you can see: ?Any grab bars or handrails. ?First and last steps. ?Where the edge of  each step is. ?Use tools that help you move around (mobility aids) if they are needed. These include: ?Canes. ?Walkers. ?Scooters. ?Crutches. ?Turn on the lights when you go into a dark area. Replace any l

## 2022-01-03 DIAGNOSIS — H52223 Regular astigmatism, bilateral: Secondary | ICD-10-CM | POA: Diagnosis not present

## 2022-01-03 DIAGNOSIS — E119 Type 2 diabetes mellitus without complications: Secondary | ICD-10-CM | POA: Diagnosis not present

## 2022-01-03 DIAGNOSIS — H53042 Amblyopia suspect, left eye: Secondary | ICD-10-CM | POA: Diagnosis not present

## 2022-01-03 DIAGNOSIS — H43811 Vitreous degeneration, right eye: Secondary | ICD-10-CM | POA: Diagnosis not present

## 2022-01-03 DIAGNOSIS — H25813 Combined forms of age-related cataract, bilateral: Secondary | ICD-10-CM | POA: Diagnosis not present

## 2022-01-03 DIAGNOSIS — H524 Presbyopia: Secondary | ICD-10-CM | POA: Diagnosis not present

## 2022-01-03 LAB — HM DIABETES EYE EXAM

## 2022-01-09 ENCOUNTER — Ambulatory Visit (HOSPITAL_BASED_OUTPATIENT_CLINIC_OR_DEPARTMENT_OTHER): Payer: Medicare PPO

## 2022-01-14 ENCOUNTER — Ambulatory Visit (HOSPITAL_BASED_OUTPATIENT_CLINIC_OR_DEPARTMENT_OTHER)
Admission: RE | Admit: 2022-01-14 | Discharge: 2022-01-14 | Disposition: A | Discharge status: Home / Self Care | Payer: Medicare PPO | Source: Ambulatory Visit | Attending: Family Medicine | Admitting: Family Medicine

## 2022-01-14 ENCOUNTER — Encounter (HOSPITAL_BASED_OUTPATIENT_CLINIC_OR_DEPARTMENT_OTHER): Payer: Self-pay

## 2022-01-14 DIAGNOSIS — Z780 Asymptomatic menopausal state: Secondary | ICD-10-CM | POA: Diagnosis not present

## 2022-01-14 DIAGNOSIS — Z1231 Encounter for screening mammogram for malignant neoplasm of breast: Secondary | ICD-10-CM | POA: Insufficient documentation

## 2022-01-14 DIAGNOSIS — Z1239 Encounter for other screening for malignant neoplasm of breast: Secondary | ICD-10-CM | POA: Insufficient documentation

## 2022-01-15 ENCOUNTER — Encounter: Payer: Self-pay | Admitting: Internal Medicine

## 2022-01-15 ENCOUNTER — Ambulatory Visit (INDEPENDENT_AMBULATORY_CARE_PROVIDER_SITE_OTHER): Payer: Medicare PPO | Admitting: Internal Medicine

## 2022-01-15 VITALS — BP 127/81 | HR 74 | Ht 65.0 in | Wt 252.4 lb

## 2022-01-15 DIAGNOSIS — I428 Other cardiomyopathies: Secondary | ICD-10-CM | POA: Diagnosis not present

## 2022-01-15 DIAGNOSIS — E1165 Type 2 diabetes mellitus with hyperglycemia: Secondary | ICD-10-CM

## 2022-01-15 DIAGNOSIS — I5042 Chronic combined systolic (congestive) and diastolic (congestive) heart failure: Secondary | ICD-10-CM

## 2022-01-15 DIAGNOSIS — I7121 Aneurysm of the ascending aorta, without rupture: Secondary | ICD-10-CM

## 2022-01-15 DIAGNOSIS — Z79899 Other long term (current) drug therapy: Secondary | ICD-10-CM | POA: Diagnosis not present

## 2022-01-15 MED ORDER — EMPAGLIFLOZIN 10 MG PO TABS: 10.0000 mg | ORAL_TABLET | Freq: Every day | ORAL | 3 refills | Status: DC

## 2022-01-15 NOTE — Progress Notes (Signed)
? ?OFFICE NOTE ? ?Chief Complaint:  ?Follow-up heart failure ? ?Primary Care Physician: ?[name], DO ? ?HPI:  ?Sharon Yu is a 71 y.o. female with a past medial history significant for hypertension, dyslipidemia, recent hospitalization for heart failure.  She was found to have a nonischemic cardiomyopathy with normal coronary arteries on cardiac catheterization in November 2018.  LVEF on echo was 25-30%.  She was started on carvedilol and losartan.  After discharge blood pressure remained elevated and her losartan was switched to olmesartan.  Carvedilol was then increased.  She reports she is doing well on the medications.  Initially blood pressure was elevated today however repeat came down to 124/78.  Weight is within a few pounds where it had been previously.  She denies any new or worsening edema.  She denies any chest pain.  She reports that she is going to start to exercise more with silver sneakers.  She attends the [employer] in [facility].  She was also found to have a 4.3 cm ascending aortic aneurysm.  This will need to be reimaged in approximately 1 year. ? ?04/08/2018 ? ?Sharon Yu returns today for heart failure follow-up.  We recently performed a repeat echocardiogram, she indicates improvement in LVEF from 25 to 30% up to 40 to 45%.  She is tolerating the addition of carvedilol and is on losartan.  She also takes low-dose aspirin, atorvastatin and furosemide 40 mg daily.  She would does report symptomatic improvement and currently endorses NYHA class II symptoms.  Echo shows stability of her 4.3 cm ascending aortic aneurysm and we will recommend following this annually.  We may consider a CT scan of the aorta during the next study to more fully visualize the aortic arch and proximal descending aorta. ? ?07/09/2018 ? ?Sharon Yu is seen today for follow-up heart failure.  Recently her LVEF improved from 25 to 30% up to 40 to 45%.  She is on moderate dose Entresto.  She endorses no  more than NYHA class II symptoms.  She was noted to have a 4.3 cm ascending aortic aneurysm which will need routine follow-up.  Her last echo was in July 2019.  She is had about 6 pound weight loss this year.  She is on moderate dose Lasix.  She seems to be tolerating medications and notes her blood pressure at home is around the 120s over low 70s.  I think there is room to increase her Entresto further.  She says it cost her about $80 for a 34-month supply which she said is affordable at this time and she is not receiving patient assistance because she did not qualify financially. ? ?04/28/2019 ? ?Sharon Yu seen today for heart failure follow-up.  Overall she says she is feeling well.  She had a recent echo which showed a small interval improvement in LV function up to 45 to 50%.  She has had escalating doses of Entresto now on max dose 97/103 mg twice daily.  She is also on carvedilol 6.25 mg twice daily.  Overall she endorses NYHA class I-II symptoms.  EKG today shows sinus rhythm at 79. ? ?11/03/2019 ? ?Sharon Yu returns today for follow-up.  Sadly she reports that her brother died today.  She says she was with family but because she did not want to miss the appointment came to the office.  She was interested in follow-up of her echo which shows a stable LVEF 45 to 50% but no significant improvement despite maximizing the  dose of her Entresto.  Blood pressure would not likely allow increases in her medication.  Heart rate was elevated today however she was rushed to get here and under stress.  He says in general her blood pressure is in the low 100s and heart rate in the 60s. ? ?06/19/2020 ? ?Sharon Yu is seen today in follow-up.  She had an echo earlier this year in January 2021 which showed fairly stable LVEF of 45 to 50%.  This is a nonischemic cardiomyopathy with normal coronaries by cath in November 2018.  She is now on maximum dose Entresto of 97/103 mg twice daily.  She is also recently had an  increase in carvedilol to 12.5 mg twice daily by her PCP for persistently elevated blood pressure.  Today is 157/89 and in general she says it sits around 140/90.  This very well may be a hypertensive cardiomyopathy.  At present she has no more than class II heart failure symptoms.  Weight is still an issue with morbid obesity and a BMI of 40.  She says she does sleep well at night denies snoring but has never had sleep study.  EKG shows sinus rhythm.  Labs from March 2021 showed total cholesterol 111, HDL 43, LDL 45 and triglycerides 109. ? ?11/16/2020 ? ?Sharon Yu is seen today for follow-up.  Overall she is doing well.  Blood pressure was again elevated today 161/93.  She says at home it is lower however typically not better than 140 systolic.  We had repeated an echo recently and unfortunately despite maximal treatment with Entresto her LVEF is slightly lower at 40 to 45%.  This may be related to interpretation however I feel that with her blood pressure still poorly controlled that that is contributing to this.  Overall she has likely NYHA class II symptoms. ? ?02/20/2021 ? ?Sharon Yu is seen today in follow-up of her heart failure.  Unfortunately she never started the Jardiance because she has concerns about side effects and was not able to afford the medication.  It also appears that she was noncompliant with her statin.  Her cholesterol has more than doubled with total cholesterol now 203 up from 111 and LDL cholesterol 126 up from 45.  She initially said that she was not compliant with her diet and had missed a few doses of her medicine and then subsequently said she stopped it completely and was trying over-the-counter red yeast rice.  She has now gone back to 40 mg daily atorvastatin. ? ?01/15/2022 ? ?Sharon Yu returns today for follow-up.  She had a repeat echo in February which shows a stable LVEF 40 to 45% and mildly dilated aorta at 40 mm.  This is consistent with earlier measurements.  She is  maximized on Entresto and a higher dose of carvedilol.  We have previously discussed an SGLT2 inhibitor and I prescribed it for her but she never took the medicine.  She was concerned about possible side effects and adding another medicine.  She had also stopped her atorvastatin in favor of trying herbal supplements.  This was not effective and she did actually resume that.  Cholesterol in December showed the LDL was back down to 58.  Recent A1c was 6.0. ? ?PMHx:  ?Past Medical History:  ?Diagnosis Date  ? Arthritis   ? Back pain   ? buldging disc  ? Chronic back pain   ? Hyperlipidemia   ? takes Crestor daily  ? Hypertension   ? takes Maxzide and  Metoprolol daily along with Ramipril  ? Hypopotassemia   ? takes Potassium pill daily  ? Joint pain   ? Joint swelling   ? Nonischemic cardiomyopathy (HCC)   ? a. 07/2017: EF reduced to 25-30%, cath showing normal cors.   ? Shortness of breath   ? with exertion  ? ? ?Past Surgical History:  ?Procedure Laterality Date  ? bilateral knee arthroscopies    ? carpel tunnel    ? bilateral  ? CHOLECYSTECTOMY    ? colonscopy    ? JOINT REPLACEMENT Left   ? RIGHT/LEFT HEART CATH AND CORONARY ANGIOGRAPHY N/A 07/17/2017  ? Procedure: RIGHT/LEFT HEART CATH AND CORONARY ANGIOGRAPHY;  Surgeon: Swazil[place], Peter M, MD;  Location: [facility] INVASIVE CV LAB;  Service: Cardiovascular;  Laterality: N/A;  ? TOTAL KNEE ARTHROPLASTY Right 02/19/2013  ? Procedure: TOTAL KNEE ARTHROPLASTY;  Surgeon: Thera Flake., MD;  Location: [facility] OR;  Service: Orthopedics;  Laterality: Right;  right total knee arthroplasty  ? TOTAL KNEE REVISION Left 07/02/2013  ? Procedure: LEFT TOTAL KNEE TIBIA REVISION;  Surgeon: Nestor Lewandowsky, MD;  Location: [facility] OR;  Service: Orthopedics;  Laterality: Left;  ? ? ?FAMHx:  ?Family History  ?Problem Relation Age of Onset  ? Hypertension Mother   ? Arthritis Mother   ? Asthma Mother   ? Diabetes Mother   ? Hyperlipidemia Mother   ? Cancer Mother   ?     uterine cancer  ? CAD Sister   ?  Heart failure Sister   ? Hyperlipidemia Sister   ? Heart disease Sister   ? Early death Father   ? Asthma Brother   ? COPD Brother   ? Depression Brother   ? AAA (abdominal aortic aneurysm) Daughter   ? Hearing lo

## 2022-01-15 NOTE — Patient Instructions (Signed)
Medication Instructions:  ?START jardiance 10mg  daily ? ?*If you need a refill on your cardiac medications before your next appointment, please call your pharmacy* ? ? ?Lab Work: ?Non-Fasting BMET at PCP office in a few weeks ? ?If you have labs (blood work) drawn today and your tests are completely normal, you will receive your results only by: ?MyChart Message (if you have MyChart) OR ?A paper copy in the mail ?If you have any lab test that is abnormal or we need to change your treatment, we will call you to review the results. ? ? ?Testing/Procedures: ?NONE ? ? ?Follow-Up: ?At [facility], you and your health needs are our priority.  As part of our continuing mission to provide you with exceptional heart care, we have created designated Provider Care Teams.  These Care Teams include your primary Cardiologist (physician) and Advanced Practice Providers (APPs -  Physician Assistants and Nurse Practitioners) who all work together to provide you with the care you need, when you need it. ? ?We recommend signing up for the patient portal called "MyChart".  Sign up information is provided on this After Visit Summary.  MyChart is used to connect with patients for Virtual Visits (Telemedicine).  Patients are able to view lab/test results, encounter notes, upcoming appointments, etc.  Non-urgent messages can be sent to your provider as well.   ?To learn more about what you can do with MyChart, go to [email or web address].   ? ?Your next appointment:   ?6 month(s) ? ?The format for your next appointment:   ?In Person ? ?Provider:   ?Pixie Casino, MD { ?

## 2022-02-04 ENCOUNTER — Encounter: Payer: Self-pay | Admitting: Family Medicine

## 2022-02-04 ENCOUNTER — Ambulatory Visit (INDEPENDENT_AMBULATORY_CARE_PROVIDER_SITE_OTHER): Payer: Medicare PPO | Admitting: Family Medicine

## 2022-02-04 ENCOUNTER — Other Ambulatory Visit: Payer: Self-pay | Admitting: Family Medicine

## 2022-02-04 VITALS — BP 140/80 | HR 81 | Temp 98.2°F | Ht 65.0 in | Wt 251.2 lb

## 2022-02-04 DIAGNOSIS — Z0000 Encounter for general adult medical examination without abnormal findings: Secondary | ICD-10-CM | POA: Diagnosis not present

## 2022-02-04 DIAGNOSIS — E1165 Type 2 diabetes mellitus with hyperglycemia: Secondary | ICD-10-CM

## 2022-02-04 DIAGNOSIS — M810 Age-related osteoporosis without current pathological fracture: Secondary | ICD-10-CM | POA: Diagnosis not present

## 2022-02-04 DIAGNOSIS — E876 Hypokalemia: Secondary | ICD-10-CM

## 2022-02-04 LAB — LIPID PANEL
Cholesterol: 109 mg/dL (ref 0–200)
HDL: 42.4 mg/dL (ref >39.00)
LDL Cholesterol: 49 mg/dL (ref 0–99)
NonHDL: 66.97
Total CHOL/HDL Ratio: 3
Triglycerides: 88 mg/dL (ref 0.0–149.0)
VLDL: 17.6 mg/dL (ref 0.0–40.0)

## 2022-02-04 LAB — CBC
HCT: 37.2 % (ref 36.0–46.0)
Hemoglobin: 12.2 g/dL (ref 12.0–15.0)
MCHC: 32.8 g/dL (ref 30.0–36.0)
MCV: 88 fl (ref 78.0–100.0)
Platelets: 192 10*3/uL (ref 150.0–400.0)
RBC: 4.23 Mil/uL (ref 3.87–5.11)
RDW: 14.3 % (ref 11.5–15.5)
WBC: 8 10*3/uL (ref 4.0–10.5)

## 2022-02-04 LAB — COMPREHENSIVE METABOLIC PANEL
ALT: 13 U/L (ref 0–35)
AST: 10 U/L (ref 0–37)
Albumin: 3.9 g/dL (ref 3.5–5.2)
Alkaline Phosphatase: 87 U/L (ref 39–117)
BUN: 18 mg/dL (ref 6–23)
CO2: 26 mEq/L (ref 19–32)
Calcium: 9 mg/dL (ref 8.4–10.5)
Chloride: 104 mEq/L (ref 96–112)
Creatinine, Ser: 0.92 mg/dL (ref 0.40–1.20)
GFR: 63.74 mL/min (ref >60.00)
Glucose, Bld: 108 mg/dL — ABNORMAL HIGH (ref 70–99)
Potassium: 3.4 mEq/L — ABNORMAL LOW (ref 3.5–5.1)
Sodium: 141 mEq/L (ref 135–145)
Total Bilirubin: 0.5 mg/dL (ref 0.2–1.2)
Total Protein: 7 g/dL (ref 6.0–8.3)

## 2022-02-04 LAB — MICROALBUMIN / CREATININE URINE RATIO
Creatinine,U: 130.9 mg/dL
Microalb Creat Ratio: 16.9 mg/g (ref 0.0–30.0)
Microalb, Ur: 22.1 mg/dL — ABNORMAL HIGH (ref 0.0–1.9)

## 2022-02-04 LAB — VITAMIN D 25 HYDROXY (VIT D DEFICIENCY, FRACTURES): VITD: 34.23 ng/mL (ref 30.00–100.00)

## 2022-02-04 LAB — HEMOGLOBIN A1C: Hgb A1c MFr Bld: 6.2 % (ref 4.6–6.5)

## 2022-02-04 NOTE — Patient Instructions (Addendum)
Give us 2-3 business days to get the results of your labs back.   Keep the diet clean and stay active.  Heat (pad or rice pillow in microwave) over affected area, 10-15 minutes twice daily.   Ice/cold pack over area for 10-15 min twice daily.  OK to take Tylenol 1000 mg (2 extra strength tabs) or 975 mg (3 regular strength tabs) every 6 hours as needed.  Let us know if you need anything.  EXERCISES  RANGE OF MOTION (ROM) AND STRETCHING EXERCISES - Low Back Pain Most people with lower back pain will find that their symptoms get worse with excessive bending forward (flexion) or arching at the lower back (extension). The exercises that will help resolve your symptoms will focus on the opposite motion.  If you have pain, numbness or tingling which travels down into your buttocks, leg or foot, the goal of the therapy is for these symptoms to move closer to your back and eventually resolve. Sometimes, these leg symptoms will get better, but your lower back pain may worsen. This is often an indication of progress in your rehabilitation. Be very alert to any changes in your symptoms and the activities in which you participated in the 24 hours prior to the change. Sharing this information with your caregiver will allow him or her to most efficiently treat your condition. These exercises may help you when beginning to rehabilitate your injury. Your symptoms may resolve with or without further involvement from your physician, physical therapist or athletic trainer. While completing these exercises, remember:  Restoring tissue flexibility helps normal motion to return to the joints. This allows healthier, less painful movement and activity. An effective stretch should be held for at least 30 seconds. A stretch should never be painful. You should only feel a gentle lengthening or release in the stretched tissue. FLEXION RANGE OF MOTION AND STRETCHING EXERCISES:  STRETCH - Flexion, Single Knee to Chest  Lie  on a firm bed or floor with both legs extended in front of you. Keeping one leg in contact with the floor, bring your opposite knee to your chest. Hold your leg in place by either grabbing behind your thigh or at your knee. Pull until you feel a gentle stretch in your low back. Hold 30 seconds. Slowly release your grasp and repeat the exercise with the opposite side. Repeat 2 times. Complete this exercise 3 times per week.   STRETCH - Flexion, Double Knee to Chest Lie on a firm bed or floor with both legs extended in front of you. Keeping one leg in contact with the floor, bring your opposite knee to your chest. Tense your stomach muscles to support your back and then lift your other knee to your chest. Hold your legs in place by either grabbing behind your thighs or at your knees. Pull both knees toward your chest until you feel a gentle stretch in your low back. Hold 30 seconds. Tense your stomach muscles and slowly return one leg at a time to the floor. Repeat 2 times. Complete this exercise 3 times per week.   STRETCH - Low Trunk Rotation Lie on a firm bed or floor. Keeping your legs in front of you, bend your knees so they are both pointed toward the ceiling and your feet are flat on the floor. Extend your arms out to the side. This will stabilize your upper body by keeping your shoulders in contact with the floor. Gently and slowly drop both knees together to one side   until you feel a gentle stretch in your low back. Hold for 30 seconds. Tense your stomach muscles to support your lower back as you bring your knees back to the starting position. Repeat the exercise to the other side. Repeat 2 times. Complete this exercise at least 3 times per week.   EXTENSION RANGE OF MOTION AND FLEXIBILITY EXERCISES:  STRETCH - Extension, Prone on Elbows  Lie on your stomach on the floor, a bed will be too soft. Place your palms about shoulder width apart and at the height of your head. Place your  elbows under your shoulders. If this is too painful, stack pillows under your chest. Allow your body to relax so that your hips drop lower and make contact more completely with the floor. Hold this position for 30 seconds. Slowly return to lying flat on the floor. Repeat 2 times. Complete this exercise 3 times per week.   RANGE OF MOTION - Extension, Prone Press Ups Lie on your stomach on the floor, a bed will be too soft. Place your palms about shoulder width apart and at the height of your head. Keeping your back as relaxed as possible, slowly straighten your elbows while keeping your hips on the floor. You may adjust the placement of your hands to maximize your comfort. As you gain motion, your hands will come more underneath your shoulders. Hold this position 30 seconds. Slowly return to lying flat on the floor. Repeat 2 times. Complete this exercise 3 times per week.   RANGE OF MOTION- Quadruped, Neutral Spine  Assume a hands and knees position on a firm surface. Keep your hands under your shoulders and your knees under your hips. You may place padding under your knees for comfort. Drop your head and point your tailbone toward the ground below you. This will round out your lower back like an angry cat. Hold this position for 30 seconds. Slowly lift your head and release your tail bone so that your back sags into a large arch, like an old horse. Hold this position for 30 seconds. Repeat this until you feel limber in your low back. Now, find your "sweet spot." This will be the most comfortable position somewhere between the two previous positions. This is your neutral spine. Once you have found this position, tense your stomach muscles to support your low back. Hold this position for 30 seconds. Repeat 2 times. Complete this exercise 3 times per week.   STRENGTHENING EXERCISES - Low Back Sprain These exercises may help you when beginning to rehabilitate your injury. These exercises should  be done near your "sweet spot." This is the neutral, low-back arch, somewhere between fully rounded and fully arched, that is your least painful position. When performed in this safe range of motion, these exercises can be used for people who have either a flexion or extension based injury. These exercises may resolve your symptoms with or without further involvement from your physician, physical therapist or athletic trainer. While completing these exercises, remember:  Muscles can gain both the endurance and the strength needed for everyday activities through controlled exercises. Complete these exercises as instructed by your physician, physical therapist or athletic trainer. Increase the resistance and repetitions only as guided. You may experience muscle soreness or fatigue, but the pain or discomfort you are trying to eliminate should never worsen during these exercises. If this pain does worsen, stop and make certain you are following the directions exactly. If the pain is still present after adjustments, discontinue   the exercise until you can discuss the trouble with your caregiver.  STRENGTHENING - Deep Abdominals, Pelvic Tilt  Lie on a firm bed or floor. Keeping your legs in front of you, bend your knees so they are both pointed toward the ceiling and your feet are flat on the floor. Tense your lower abdominal muscles to press your low back into the floor. This motion will rotate your pelvis so that your tail bone is scooping upwards rather than pointing at your feet or into the floor. With a gentle tension and even breathing, hold this position for 3 seconds. Repeat 2 times. Complete this exercise 3 times per week.   STRENGTHENING - Abdominals, Crunches  Lie on a firm bed or floor. Keeping your legs in front of you, bend your knees so they are both pointed toward the ceiling and your feet are flat on the floor. Cross your arms over your chest. Slightly tip your chin down without bending your  neck. Tense your abdominals and slowly lift your trunk high enough to just clear your shoulder blades. Lifting higher can put excessive stress on the lower back and does not further strengthen your abdominal muscles. Control your return to the starting position. Repeat 2 times. Complete this exercise 3 times per week.   STRENGTHENING - Quadruped, Opposite UE/LE Lift  Assume a hands and knees position on a firm surface. Keep your hands under your shoulders and your knees under your hips. You may place padding under your knees for comfort. Find your neutral spine and gently tense your abdominal muscles so that you can maintain this position. Your shoulders and hips should form a rectangle that is parallel with the floor and is not twisted. Keeping your trunk steady, lift your right hand no higher than your shoulder and then your left leg no higher than your hip. Make sure you are not holding your breath. Hold this position for 30 seconds. Continuing to keep your abdominal muscles tense and your back steady, slowly return to your starting position. Repeat with the opposite arm and leg. Repeat 2 times. Complete this exercise 3 times per week.   STRENGTHENING - Abdominals and Quadriceps, Straight Leg Raise  Lie on a firm bed or floor with both legs extended in front of you. Keeping one leg in contact with the floor, bend the other knee so that your foot can rest flat on the floor. Find your neutral spine, and tense your abdominal muscles to maintain your spinal position throughout the exercise. Slowly lift your straight leg off the floor about 6 inches for a count of 3, making sure to not hold your breath. Still keeping your neutral spine, slowly lower your leg all the way to the floor. Repeat this exercise with each leg 2 times. Complete this exercise 3 times per week.  POSTURE AND BODY MECHANICS CONSIDERATIONS - Low Back Sprain Keeping correct posture when sitting, standing or completing your  activities will reduce the stress put on different body tissues, allowing injured tissues a chance to heal and limiting painful experiences. The following are general guidelines for improved posture.  While reading these guidelines, remember: The exercises prescribed by your provider will help you have the flexibility and strength to maintain correct postures. The correct posture provides the best environment for your joints to work. All of your joints have less wear and tear when properly supported by a spine with good posture. This means you will experience a healthier, less painful body. Correct posture must be   practiced with all of your activities, especially prolonged sitting and standing. Correct posture is as important when doing repetitive low-stress activities (typing) as it is when doing a single heavy-load activity (lifting).  RESTING POSITIONS Consider which positions are most painful for you when choosing a resting position. If you have pain with flexion-based activities (sitting, bending, stooping, squatting), choose a position that allows you to rest in a less flexed posture. You would want to avoid curling into a fetal position on your side. If your pain worsens with extension-based activities (prolonged standing, working overhead), avoid resting in an extended position such as sleeping on your stomach. Most people will find more comfort when they rest with their spine in a more neutral position, neither too rounded nor too arched. Lying on a non-sagging bed on your side with a pillow between your knees, or on your back with a pillow under your knees will often provide some relief. Keep in mind, being in any one position for a prolonged period of time, no matter how correct your posture, can still lead to stiffness.  PROPER SITTING POSTURE In order to minimize stress and discomfort on your spine, you must sit with correct posture. Sitting with good posture should be effortless for a healthy  body. Returning to good posture is a gradual process. Many people can work toward this most comfortably by using various supports until they have the flexibility and strength to maintain this posture on their own. When sitting with proper posture, your ears will fall over your shoulders and your shoulders will fall over your hips. You should use the back of the chair to support your upper back. Your lower back will be in a neutral position, just slightly arched. You may place a small pillow or folded towel at the base of your lower back for  support.  When working at a desk, create an environment that supports good, upright posture. Without extra support, muscles tire, which leads to excessive strain on joints and other tissues. Keep these recommendations in mind:  CHAIR: A chair should be able to slide under your desk when your back makes contact with the back of the chair. This allows you to work closely. The chair's height should allow your eyes to be level with the upper part of your monitor and your hands to be slightly lower than your elbows.  BODY POSITION Your feet should make contact with the floor. If this is not possible, use a foot rest. Keep your ears over your shoulders. This will reduce stress on your neck and low back.  INCORRECT SITTING POSTURES  If you are feeling tired and unable to assume a healthy sitting posture, do not slouch or slump. This puts excessive strain on your back tissues, causing more damage and pain. Healthier options include: Using more support, like a lumbar pillow. Switching tasks to something that requires you to be upright or walking. Talking a brief walk. Lying down to rest in a neutral-spine position.  PROLONGED STANDING WHILE SLIGHTLY LEANING FORWARD  When completing a task that requires you to lean forward while standing in one place for a long time, place either foot up on a stationary 2-4 inch high object to help maintain the best posture. When both  feet are on the ground, the lower back tends to lose its slight inward curve. If this curve flattens (or becomes too large), then the back and your other joints will experience too much stress, tire more quickly, and can cause pain.    CORRECT STANDING POSTURES Proper standing posture should be assumed with all daily activities, even if they only take a few moments, like when brushing your teeth. As in sitting, your ears should fall over your shoulders and your shoulders should fall over your hips. You should keep a slight tension in your abdominal muscles to brace your spine. Your tailbone should point down to the ground, not behind your body, resulting in an over-extended swayback posture.   INCORRECT STANDING POSTURES  Common incorrect standing postures include a forward head, locked knees and/or an excessive swayback. WALKING Walk with an upright posture. Your ears, shoulders and hips should all line-up.  PROLONGED ACTIVITY IN A FLEXED POSITION When completing a task that requires you to bend forward at your waist or lean over a low surface, try to find a way to stabilize 3 out of 4 of your limbs. You can place a hand or elbow on your thigh or rest a knee on the surface you are reaching across. This will provide you more stability, so that your muscles do not tire as quickly. By keeping your knees relaxed, or slightly bent, you will also reduce stress across your lower back. CORRECT LIFTING TECHNIQUES  DO : Assume a wide stance. This will provide you more stability and the opportunity to get as close as possible to the object which you are lifting. Tense your abdominals to brace your spine. Bend at the knees and hips. Keeping your back locked in a neutral-spine position, lift using your leg muscles. Lift with your legs, keeping your back straight. Test the weight of unknown objects before attempting to lift them. Try to keep your elbows locked down at your sides in order get the best strength  from your shoulders when carrying an object.   Always ask for help when lifting heavy or awkward objects. INCORRECT LIFTING TECHNIQUES DO NOT:  Lock your knees when lifting, even if it is a small object. Bend and twist. Pivot at your feet or move your feet when needing to change directions. Assume that you can safely pick up even a paperclip without proper posture. 

## 2022-02-04 NOTE — Progress Notes (Signed)
Chief Complaint  Patient presents with   Annual Exam     Well Woman Sharon Yu is here for a complete physical.   Her last physical was >1 year ago.  Current diet: in general, a "healthy" diet. Current exercise: cycling, some wt lifting. Weight is slightly decreasing intentionally and she denies daytime fatigue. Seatbelt? Yes Advanced directive? Yes, on file  Health Maintenance Colonoscopy- Yes Shingrix- Yes DEXA- Yes Mammogram- Yes Tetanus- Yes Pneumonia- Yes Hep C screen- Yes  Past Medical History:  Diagnosis Date   Arthritis    Back pain    buldging disc   Chronic back pain    Hyperlipidemia    takes Crestor daily   Hypertension    takes Maxzide and Metoprolol daily along with Ramipril   Hypopotassemia    takes Potassium pill daily   Joint pain    Joint swelling    Nonischemic cardiomyopathy ([place])    a. 07/2017: EF reduced to 25-30%, cath showing normal cors.    Shortness of breath    with exertion     Past Surgical History:  Procedure Laterality Date   bilateral knee arthroscopies     carpel tunnel     bilateral   CHOLECYSTECTOMY     colonscopy     JOINT REPLACEMENT Left    RIGHT/LEFT HEART CATH AND CORONARY ANGIOGRAPHY N/A 07/17/2017   Procedure: RIGHT/LEFT HEART CATH AND CORONARY ANGIOGRAPHY;  Surgeon: Martinique, Peter M, MD;  Location: [facility];  Service: Cardiovascular;  Laterality: N/A;   TOTAL KNEE ARTHROPLASTY Right 02/19/2013   Procedure: TOTAL KNEE ARTHROPLASTY;  Surgeon: Yvette Rack., MD;  Location: [facility];  Service: Orthopedics;  Laterality: Right;  right total knee arthroplasty   TOTAL KNEE REVISION Left 07/02/2013   Procedure: LEFT TOTAL KNEE TIBIA REVISION;  Surgeon: Kerin Salen, MD;  Location: [facility];  Service: Orthopedics;  Laterality: Left;    Medications  Current Outpatient Medications on File Prior to Visit  Medication Sig Dispense Refill   alendronate (FOSAMAX) 70 MG tablet Take 1 tablet (70 mg total) by mouth every  7 (seven) days. Saturdays. Take with a full glass of water on an empty stomach. 12 tablet 2   aspirin 81 MG chewable tablet Chew 81 mg daily by mouth.     atorvastatin (LIPITOR) 40 MG tablet Take 1 tablet (40 mg total) by mouth daily. 90 tablet 3   carvedilol (COREG) 25 MG tablet TAKE 1 TABLET BY MOUTH TWICE DAILY WITH A MEAL 180 tablet 0   empagliflozin (JARDIANCE) 10 MG TABS tablet Take 1 tablet (10 mg total) by mouth daily. 90 tablet 3   ENTRESTO 97-103 MG Take 1 tablet by mouth twice daily 180 tablet 0   fluticasone (FLONASE) 50 MCG/ACT nasal spray Place 2 sprays into both nostrils daily. 16 g 2   furosemide (LASIX) 40 MG tablet Take 1 tablet by mouth once daily 90 tablet 0   hydrALAZINE (APRESOLINE) 25 MG tablet TAKE 1 TABLET BY MOUTH IN THE MORNING AND AT BEDTIME 180 tablet 0   meloxicam (MOBIC) 15 MG tablet TAKE 1 TABLET BY MOUTH ONCE DAILY AS NEEDED FOR PAIN 30 tablet 0   metFORMIN (GLUCOPHAGE) 500 MG tablet Take 1 tablet by mouth once daily with breakfast 90 tablet 0   Multiple Vitamin (MULITIVITAMIN WITH MINERALS) TABS Take 1 tablet by mouth daily.     Potassium (POTASSIMIN PO) Take by mouth.     Vitamin D3 (VITAMIN D) 25 MCG  tablet Take 1,000 Units by mouth daily.     vitamin E 180 MG (400 UNITS) capsule Take 400 Units by mouth daily.      Allergies Allergies  Allergen Reactions   Other Hives and Swelling    "Grapple" (hybrid apple & grape)    Review of Systems: Constitutional:  no fevers Eye:  no recent significant change in vision Ears:  No changes in hearing Nose/Mouth/Throat:  no complaints of nasal congestion, no sore throat Cardiovascular: no chest pain Respiratory:  No shortness of breath Gastrointestinal:  No change in bowel habits GU:  Female: negative for dysuria Integumentary:  no abnormal skin lesions reported Neurologic:  no headaches Endocrine:  denies unexplained weight changes  Exam BP 140/80   Pulse 81   Temp 98.2 F (36.8 C) (Oral)   Ht 5\' 5"   (1.651 m)   Wt 251 lb 4 oz (114 kg)   SpO2 99%   BMI 41.81 kg/m  General:  well developed, well nourished, in no apparent distress Skin:  no significant moles, warts, or growths Head:  no masses, lesions, or tenderness Eyes:  pupils equal and round, sclera anicteric without injection Ears:  canals without lesions, TMs shiny without retraction, no obvious effusion, no erythema Nose:  nares patent, septum midline, mucosa normal, and no drainage or sinus tenderness Throat/Pharynx:  lips and gingiva without lesion; tongue and uvula midline; non-inflamed pharynx; no exudates or postnasal drainage Neck: neck supple without adenopathy, thyromegaly, or masses Lungs:  clear to auscultation, breath sounds equal bilaterally, no respiratory distress Cardio:  regular rate and rhythm, no bruits or LE edema Abdomen:  abdomen soft, nontender; bowel sounds normal; no masses or organomegaly Genital: Deferred Neuro:sensation intact to pinprick on both feet;  gait cautious, aid of cane; deep tendon reflexes normal and symmetric, neg straight leg MSK: No ttp over thigh or LBP, poor hamstring ROM Psych: well oriented with normal range of affect and appropriate judgment/insight  Assessment and Plan  Well adult exam  Type 2 diabetes mellitus with hyperglycemia, without long-term current use of insulin (HCC) - Plan: CBC, Comprehensive metabolic panel, Microalbumin / creatinine urine ratio, Lipid panel, Hemoglobin A1c  Age-related osteoporosis without current pathological fracture - Plan: VITAMIN D 25 Hydroxy (Vit-D Deficiency, Fractures)   Well 71 y.o. female. Counseled on diet and exercise. Other orders as above. Nml foot exam today.  Follow up in 6 mo or prn. The patient voiced understanding and agreement to the plan.  [place], DO 02/04/22 9:47 AM

## 2022-02-05 ENCOUNTER — Other Ambulatory Visit: Payer: Self-pay | Admitting: Family Medicine

## 2022-02-08 ENCOUNTER — Other Ambulatory Visit (INDEPENDENT_AMBULATORY_CARE_PROVIDER_SITE_OTHER): Payer: Medicare PPO

## 2022-02-08 DIAGNOSIS — E876 Hypokalemia: Secondary | ICD-10-CM | POA: Diagnosis not present

## 2022-02-08 NOTE — Addendum Note (Signed)
Addended by: Manuela Schwartz on: 02/08/2022 02:45 PM   Modules accepted: Orders

## 2022-02-09 LAB — BASIC METABOLIC PANEL
BUN: 22 mg/dL (ref 7–25)
CO2: 21 mmol/L (ref 20–32)
Calcium: 8.5 mg/dL — ABNORMAL LOW (ref 8.6–10.4)
Chloride: 108 mmol/L (ref 98–110)
Creat: 0.94 mg/dL (ref 0.50–1.05)
Glucose, Bld: 84 mg/dL (ref 65–99)
Potassium: 3.6 mmol/L (ref 3.5–5.3)
Sodium: 141 mmol/L (ref 135–146)

## 2022-02-25 DIAGNOSIS — M48062 Spinal stenosis, lumbar region with neurogenic claudication: Secondary | ICD-10-CM | POA: Diagnosis not present

## 2022-02-27 ENCOUNTER — Other Ambulatory Visit: Payer: Self-pay | Admitting: Orthopedic Surgery

## 2022-02-27 DIAGNOSIS — M545 Low back pain, unspecified: Secondary | ICD-10-CM

## 2022-03-14 ENCOUNTER — Ambulatory Visit
Admission: RE | Admit: 2022-03-14 | Discharge: 2022-03-14 | Disposition: A | Discharge status: Home / Self Care | Payer: Medicare PPO | Source: Ambulatory Visit | Attending: Orthopedic Surgery | Admitting: Orthopedic Surgery

## 2022-03-14 DIAGNOSIS — M545 Low back pain, unspecified: Secondary | ICD-10-CM | POA: Diagnosis not present

## 2022-03-14 DIAGNOSIS — M48061 Spinal stenosis, lumbar region without neurogenic claudication: Secondary | ICD-10-CM | POA: Diagnosis not present

## 2022-03-14 DIAGNOSIS — R200 Anesthesia of skin: Secondary | ICD-10-CM | POA: Diagnosis not present

## 2022-03-18 DIAGNOSIS — M5416 Radiculopathy, lumbar region: Secondary | ICD-10-CM | POA: Diagnosis not present

## 2022-03-24 ENCOUNTER — Other Ambulatory Visit: Payer: Self-pay | Admitting: Internal Medicine

## 2022-03-24 ENCOUNTER — Other Ambulatory Visit: Payer: Self-pay | Admitting: Family Medicine

## 2022-03-26 DIAGNOSIS — M48062 Spinal stenosis, lumbar region with neurogenic claudication: Secondary | ICD-10-CM | POA: Diagnosis not present

## 2022-04-17 DIAGNOSIS — M48062 Spinal stenosis, lumbar region with neurogenic claudication: Secondary | ICD-10-CM | POA: Diagnosis not present

## 2022-05-01 ENCOUNTER — Other Ambulatory Visit: Payer: Self-pay | Admitting: Family Medicine

## 2022-06-23 ENCOUNTER — Other Ambulatory Visit: Payer: Self-pay | Admitting: Internal Medicine

## 2022-06-23 ENCOUNTER — Other Ambulatory Visit: Payer: Self-pay | Admitting: Family Medicine

## 2022-07-11 ENCOUNTER — Ambulatory Visit: Payer: Medicare PPO | Attending: Internal Medicine | Admitting: Internal Medicine

## 2022-07-11 ENCOUNTER — Encounter: Payer: Self-pay | Admitting: Internal Medicine

## 2022-07-11 VITALS — BP 150/90 | HR 80 | Ht 65.0 in | Wt 247.0 lb

## 2022-07-11 DIAGNOSIS — E785 Hyperlipidemia, unspecified: Secondary | ICD-10-CM

## 2022-07-11 DIAGNOSIS — I7121 Aneurysm of the ascending aorta, without rupture: Secondary | ICD-10-CM

## 2022-07-11 DIAGNOSIS — E1165 Type 2 diabetes mellitus with hyperglycemia: Secondary | ICD-10-CM | POA: Diagnosis not present

## 2022-07-11 DIAGNOSIS — I5042 Chronic combined systolic (congestive) and diastolic (congestive) heart failure: Secondary | ICD-10-CM | POA: Diagnosis not present

## 2022-07-11 DIAGNOSIS — I428 Other cardiomyopathies: Secondary | ICD-10-CM

## 2022-07-11 DIAGNOSIS — I10 Essential (primary) hypertension: Secondary | ICD-10-CM | POA: Diagnosis not present

## 2022-07-11 MED ORDER — SPIRONOLACTONE 25 MG PO TABS: 25.0000 mg | ORAL_TABLET | Freq: Every day | ORAL | 3 refills | Status: DC

## 2022-07-11 NOTE — Patient Instructions (Signed)
Medication Instructions:  START spironolactone 25mg  daily   *If you need a refill on your cardiac medications before your next appointment, please call your pharmacy*   Lab Work: Non-Fasting BMET in 2 weeks   If you have labs (blood work) drawn today and your tests are completely normal, you will receive your results only by: MyChart Message (if you have MyChart) OR A paper copy in the mail If you have any lab test that is abnormal or we need to change your treatment, we will call you to review the results.    Follow-Up: At [facility], you and your health needs are our priority.  As part of our continuing mission to provide you with exceptional heart care, we have created designated Provider Care Teams.  These Care Teams include your primary Cardiologist (physician) and Advanced Practice Providers (APPs -  Physician Assistants and Nurse Practitioners) who all work together to provide you with the care you need, when you need it.  We recommend signing up for the patient portal called "MyChart".  Sign up information is provided on this After Visit Summary.  MyChart is used to connect with patients for Virtual Visits (Telemedicine).  Patients are able to view lab/test results, encounter notes, upcoming appointments, etc.  Non-urgent messages can be sent to your provider as well.   To learn more about what you can do with MyChart, go to [facility].    Your next appointment:    6 months with [email or web address]

## 2022-07-11 NOTE — Progress Notes (Signed)
OFFICE NOTE  Chief Complaint:  Follow-up heart failure  Primary Care Physician: [name], DO  HPI:  Sharon Yu is a 71 y.o. female with a past medial history significant for hypertension, dyslipidemia, recent hospitalization for heart failure.  She was found to have a nonischemic cardiomyopathy with normal coronary arteries on cardiac catheterization in November 2018.  LVEF on echo was 25-30%.  She was started on carvedilol and losartan.  After discharge blood pressure remained elevated and her losartan was switched to olmesartan.  Carvedilol was then increased.  She reports she is doing well on the medications.  Initially blood pressure was elevated today however repeat came down to 124/78.  Weight is within a few pounds where it had been previously.  She denies any new or worsening edema.  She denies any chest pain.  She reports that she is going to start to exercise more with silver sneakers.  She attends the [facility] in [facility].  She was also found to have a 4.3 cm ascending aortic aneurysm.  This will need to be reimaged in approximately 1 year.  04/08/2018  Sharon Yu returns today for heart failure follow-up.  We recently performed a repeat echocardiogram, she indicates improvement in LVEF from 25 to 30% up to 40 to 45%.  She is tolerating the addition of carvedilol and is on losartan.  She also takes low-dose aspirin, atorvastatin and furosemide 40 mg daily.  She would does report symptomatic improvement and currently endorses NYHA class II symptoms.  Echo shows stability of her 4.3 cm ascending aortic aneurysm and we will recommend following this annually.  We may consider a CT scan of the aorta during the next study to more fully visualize the aortic arch and proximal descending aorta.  07/09/2018  Sharon Yu is seen today for follow-up heart failure.  Recently her LVEF improved from 25 to 30% up to 40 to 45%.  She is on moderate dose Entresto.  She endorses no  more than NYHA class II symptoms.  She was noted to have a 4.3 cm ascending aortic aneurysm which will need routine follow-up.  Her last echo was in July 2019.  She is had about 6 pound weight loss this year.  She is on moderate dose Lasix.  She seems to be tolerating medications and notes her blood pressure at home is around the 120s over low 70s.  I think there is room to increase her Entresto further.  She says it cost her about $80 for a 54-month supply which she said is affordable at this time and she is not receiving patient assistance because she did not qualify financially.  04/28/2019  Sharon Yu seen today for heart failure follow-up.  Overall she says she is feeling well.  She had a recent echo which showed a small interval improvement in LV function up to 45 to 50%.  She has had escalating doses of Entresto now on max dose 97/103 mg twice daily.  She is also on carvedilol 6.25 mg twice daily.  Overall she endorses NYHA class I-II symptoms.  EKG today shows sinus rhythm at 79.  11/03/2019  Sharon Yu returns today for follow-up.  Sadly she reports that her brother died today.  She says she was with family but because she did not want to miss the appointment came to the office.  She was interested in follow-up of her echo which shows a stable LVEF 45 to 50% but no significant improvement despite maximizing  the dose of her Entresto.  Blood pressure would not likely allow increases in her medication.  Heart rate was elevated today however she was rushed to get here and under stress.  He says in general her blood pressure is in the low 100s and heart rate in the 60s.  06/19/2020  Sharon Yu is seen today in follow-up.  She had an echo earlier this year in January 2021 which showed fairly stable LVEF of 45 to 50%.  This is a nonischemic cardiomyopathy with normal coronaries by cath in November 2018.  She is now on maximum dose Entresto of 97/103 mg twice daily.  She is also recently had an  increase in carvedilol to 12.5 mg twice daily by her PCP for persistently elevated blood pressure.  Today is 157/89 and in general she says it sits around 140/90.  This very well may be a hypertensive cardiomyopathy.  At present she has no more than class II heart failure symptoms.  Weight is still an issue with morbid obesity and a BMI of 40.  She says she does sleep well at night denies snoring but has never had sleep study.  EKG shows sinus rhythm.  Labs from March 2021 showed total cholesterol 111, HDL 43, LDL 45 and triglycerides 109.  11/16/2020  Sharon Yu is seen today for follow-up.  Overall she is doing well.  Blood pressure was again elevated today 161/93.  She says at home it is lower however typically not better than 140 systolic.  We had repeated an echo recently and unfortunately despite maximal treatment with Entresto her LVEF is slightly lower at 40 to 45%.  This may be related to interpretation however I feel that with her blood pressure still poorly controlled that that is contributing to this.  Overall she has likely NYHA class II symptoms.  02/20/2021  Sharon Yu is seen today in follow-up of her heart failure.  Unfortunately she never started the Jardiance because she has concerns about side effects and was not able to afford the medication.  It also appears that she was noncompliant with her statin.  Her cholesterol has more than doubled with total cholesterol now 203 up from 111 and LDL cholesterol 126 up from 45.  She initially said that she was not compliant with her diet and had missed a few doses of her medicine and then subsequently said she stopped it completely and was trying over-the-counter red yeast rice.  She has now gone back to 40 mg daily atorvastatin.  01/15/2022  Sharon Yu returns today for follow-up.  She had a repeat echo in February which shows a stable LVEF 40 to 45% and mildly dilated aorta at 40 mm.  This is consistent with earlier measurements.  She is  maximized on Entresto and a higher dose of carvedilol.  We have previously discussed an SGLT2 inhibitor and I prescribed it for her but she never took the medicine.  She was concerned about possible side effects and adding another medicine.  She had also stopped her atorvastatin in favor of trying herbal supplements.  This was not effective and she did actually resume that.  Cholesterol in December showed the LDL was back down to 58.  Recent A1c was 6.0.  07/11/2022  Sharon Yu is seen today in follow-up.  Unfortunately her brother passed away recently.  She has been under quite a bit of stress.  Her blood pressure was elevated today although has been elevated somewhat recently.  She had previously tried  Jardiance but had some urinary tract issues with that and discontinued it.  Otherwise she is on guideline directed medical therapy with high-dose Entresto, carvedilol and furosemide as well as hydralazine.  Blood pressure is running 150/90 today.  She denies any heart failure symptoms or chest pain.  PMHx:  Past Medical History:  Diagnosis Date   Arthritis    Back pain    buldging disc   Chronic back pain    Hyperlipidemia    takes Crestor daily   Hypertension    takes Maxzide and Metoprolol daily along with Ramipril   Hypopotassemia    takes Potassium pill daily   Joint pain    Joint swelling    Nonischemic cardiomyopathy (HCC)    a. 07/2017: EF reduced to 25-30%, cath showing normal cors.    Shortness of breath    with exertion    Past Surgical History:  Procedure Laterality Date   bilateral knee arthroscopies     carpel tunnel     bilateral   CHOLECYSTECTOMY     colonscopy     JOINT REPLACEMENT Left    RIGHT/LEFT HEART CATH AND CORONARY ANGIOGRAPHY N/A 07/17/2017   Procedure: RIGHT/LEFT HEART CATH AND CORONARY ANGIOGRAPHY;  Surgeon: [place], Peter M, MD;  Location: [facility] INVASIVE CV LAB;  Service: Cardiovascular;  Laterality: N/A;   TOTAL KNEE ARTHROPLASTY Right 02/19/2013    Procedure: TOTAL KNEE ARTHROPLASTY;  Surgeon: Thera Flake., MD;  Location: [facility] OR;  Service: Orthopedics;  Laterality: Right;  right total knee arthroplasty   TOTAL KNEE REVISION Left 07/02/2013   Procedure: LEFT TOTAL KNEE TIBIA REVISION;  Surgeon: Nestor Lewandowsky, MD;  Location: [facility] OR;  Service: Orthopedics;  Laterality: Left;    FAMHx:  Family History  Problem Relation Age of Onset   Hypertension Mother    Arthritis Mother    Asthma Mother    Diabetes Mother    Hyperlipidemia Mother    Cancer Mother        uterine cancer   CAD Sister    Heart failure Sister    Hyperlipidemia Sister    Heart disease Sister    Early death Father    Asthma Brother    COPD Brother    Depression Brother    AAA (abdominal aortic aneurysm) Daughter    Hearing loss Daughter    Early death Daughter 9       Spinal miningitis    SOCHx:   reports that she has never smoked. She has never used smokeless tobacco. She reports that she does not drink alcohol and does not use drugs.  ALLERGIES:  Allergies  Allergen Reactions   Other Hives and Swelling    "Grapple" (hybrid apple & grape)    ROS: Pertinent items noted in HPI and remainder of comprehensive ROS otherwise negative.  HOME MEDS: Current Outpatient Medications on File Prior to Visit  Medication Sig Dispense Refill   alendronate (FOSAMAX) 70 MG tablet TAKE 1 TABLET BY MOUTH ONCE A WEEK(SATURDAYS). TAKE WITH A FULL GLASS OF WATER ON AN EMPTY STOMACH. 12 tablet 0   aspirin 81 MG chewable tablet Chew 81 mg daily by mouth.     atorvastatin (LIPITOR) 40 MG tablet Take 1 tablet (40 mg total) by mouth daily. 90 tablet 3   carvedilol (COREG) 25 MG tablet TAKE 1 TABLET BY MOUTH TWICE DAILY WITH A MEAL 180 tablet 0   ENTRESTO 97-103 MG Take 1 tablet by mouth twice daily 180 tablet 0  fluticasone (FLONASE) 50 MCG/ACT nasal spray Place 2 sprays into both nostrils daily. 16 g 2   furosemide (LASIX) 40 MG tablet Take 1 tablet by mouth once daily 90  tablet 0   gabapentin (NEURONTIN) 300 MG capsule Take 300 mg by mouth 3 (three) times daily.     hydrALAZINE (APRESOLINE) 25 MG tablet TAKE 1 TABLET BY MOUTH IN THE MORNING AND AT BEDTIME 180 tablet 0   meloxicam (MOBIC) 15 MG tablet TAKE 1 TABLET BY MOUTH ONCE DAILY AS NEEDED FOR PAIN 30 tablet 0   metFORMIN (GLUCOPHAGE) 500 MG tablet Take 1 tablet by mouth once daily with breakfast 90 tablet 0   Multiple Vitamin (MULITIVITAMIN WITH MINERALS) TABS Take 1 tablet by mouth daily.     Potassium (POTASSIMIN PO) Take by mouth.     Vitamin D3 (VITAMIN D) 25 MCG tablet Take 1,000 Units by mouth daily.     vitamin E 180 MG (400 UNITS) capsule Take 400 Units by mouth daily.     empagliflozin (JARDIANCE) 10 MG TABS tablet Take 1 tablet (10 mg total) by mouth daily. (Patient not taking: Reported on 07/11/2022) 90 tablet 3   No current facility-administered medications on file prior to visit.    LABS/IMAGING: No results found for this or any previous visit (from the past 48 hour(s)). No results found.  LIPID PANEL:    Component Value Date/Time   CHOL 109 02/04/2022 1005   TRIG 88.0 02/04/2022 1005   HDL 42.40 02/04/2022 1005   CHOLHDL 3 02/04/2022 1005   VLDL 17.6 02/04/2022 1005   LDLCALC 49 02/04/2022 1005   LDLCALC 58 08/03/2021 1515     WEIGHTS: Wt Readings from Last 3 Encounters:  07/11/22 247 lb (112 kg)  02/04/22 251 lb 4 oz (114 kg)  01/15/22 252 lb 6.4 oz (114.5 kg)    VITALS: BP (!) 150/90   Pulse 80   Ht 5\' 5"  (1.651 m)   Wt 247 lb (112 kg)   SpO2 97%   BMI 41.10 kg/m   EXAM: General appearance: alert, no distress and morbidly obese Neck: no carotid bruit, no JVD and thyroid not enlarged, symmetric, no tenderness/mass/nodules Lungs: clear to auscultation bilaterally Heart: regular rate and rhythm, S1, S2 normal, no murmur, click, rub or gallop Abdomen: soft, non-tender; bowel sounds normal; no masses,  no organomegaly Extremities: extremities normal, atraumatic, no  cyanosis or edema Pulses: 2+ and symmetric Skin: Skin color, texture, turgor normal. No rashes or lesions Neurologic: Grossly normal Psych: Pleasant  EKG: Normal sinus rhythm at 80- personally reviewed  ASSESSMENT: Chronic systolic congestive heart failure-NYHA class II symptoms, LVEF 40-45% (10/2021) Nonischemic cardiomyopathy, normal coronary arteries by heart catheterization 07/2017 Hypertension Dyslipidemia Morbid obesity 4.3 cm ascending aortic aneurysm - measured 4.0 by echo (10/2021) Borderline diabetes-A1c 6.0  PLAN: 1.   Mrs. Pyeatt she could not tolerate Jardiance due to changes in urinary issues, but denied infection.  In review of guideline directed medical therapies, she is not on Aldactone.  This might further help with her blood pressure and chronic systolic heart failure.  We will go ahead and start Aldactone 25 mg daily with plan repeat metabolic profile in about 2 to 3 Yu.  This could coincide with the follow-up that she has with her primary care physician in early December.  Otherwise follow-up with me in 6 months or sooner as necessary.  January, MD, [facility], FACP  [place]  [facility]  Medical Director of the [facility] Lipid  Disorders &  Cardiovascular Risk Reduction [facility] Diplomate of the American Board of Clinical Lipidology Attending Cardiologist  Direct Dial: [phone]  Fax: [phone]  Website:  [email or web address] Nicely Karole Oo 07/11/2022, 10:51 AM

## 2022-08-01 ENCOUNTER — Other Ambulatory Visit: Payer: Self-pay | Admitting: Family Medicine

## 2022-08-06 ENCOUNTER — Ambulatory Visit: Payer: Medicare PPO | Admitting: Family Medicine

## 2022-08-06 ENCOUNTER — Encounter: Payer: Self-pay | Admitting: Family Medicine

## 2022-08-06 VITALS — BP 132/78 | HR 68 | Temp 98.2°F | Ht 66.5 in | Wt 247.5 lb

## 2022-08-06 DIAGNOSIS — E1165 Type 2 diabetes mellitus with hyperglycemia: Secondary | ICD-10-CM | POA: Diagnosis not present

## 2022-08-06 DIAGNOSIS — E785 Hyperlipidemia, unspecified: Secondary | ICD-10-CM | POA: Diagnosis not present

## 2022-08-06 LAB — LIPID PANEL
Cholesterol: 122 mg/dL (ref 0–200)
HDL: 48.7 mg/dL (ref >39.00)
LDL Cholesterol: 55 mg/dL (ref 0–99)
NonHDL: 72.98
Total CHOL/HDL Ratio: 2
Triglycerides: 92 mg/dL (ref 0.0–149.0)
VLDL: 18.4 mg/dL (ref 0.0–40.0)

## 2022-08-06 LAB — COMPREHENSIVE METABOLIC PANEL
ALT: 16 U/L (ref 0–35)
AST: 12 U/L (ref 0–37)
Albumin: 4.3 g/dL (ref 3.5–5.2)
Alkaline Phosphatase: 94 U/L (ref 39–117)
BUN: 20 mg/dL (ref 6–23)
CO2: 28 mEq/L (ref 19–32)
Calcium: 9.3 mg/dL (ref 8.4–10.5)
Chloride: 104 mEq/L (ref 96–112)
Creatinine, Ser: 0.89 mg/dL (ref 0.40–1.20)
GFR: 66.1 mL/min (ref >60.00)
Glucose, Bld: 112 mg/dL — ABNORMAL HIGH (ref 70–99)
Potassium: 4.2 mEq/L (ref 3.5–5.1)
Sodium: 141 mEq/L (ref 135–145)
Total Bilirubin: 0.4 mg/dL (ref 0.2–1.2)
Total Protein: 7.4 g/dL (ref 6.0–8.3)

## 2022-08-06 LAB — HEMOGLOBIN A1C: Hgb A1c MFr Bld: 6.3 % (ref 4.6–6.5)

## 2022-08-06 MED ORDER — ATORVASTATIN CALCIUM 40 MG PO TABS: 40.0000 mg | ORAL_TABLET | Freq: Every day | ORAL | 3 refills | Status: DC

## 2022-08-06 NOTE — Progress Notes (Signed)
Subjective:   Chief Complaint  Patient presents with   Follow-up    6 month    Sharon Yu is a 71 y.o. female here for follow-up of diabetes.   Sharon Yu does not routinely monitor her sugars.  Patient does not require insulin.   Medications include: metformin 500 mg/d Diet is healthy overall.  Exercise: cycling, wt lifting  Hyperlipidemia Patient presents for dyslipidemia follow up. Currently being treated with Lipitor 40 mg/d and compliance with treatment thus far has been good. She denies myalgias. Diet/exercise as above.  The patient is not known to have coexisting coronary artery disease. No CP or SOB.   Past Medical History:  Diagnosis Date   Arthritis    Back pain    buldging disc   Chronic back pain    Hyperlipidemia    takes Crestor daily   Hypertension    takes Maxzide and Metoprolol daily along with Ramipril   Hypopotassemia    takes Potassium pill daily   Joint pain    Joint swelling    Nonischemic cardiomyopathy (HCC)    a. 07/2017: EF reduced to 25-30%, cath showing normal cors.    Shortness of breath    with exertion     Related testing: Retinal exam: Done Pneumovax: done  Objective:  BP 132/78 (BP Location: Right Arm, Patient Position: Sitting, Cuff Size: Normal)   Pulse 68   Temp 98.2 F (36.8 C) (Oral)   Ht 5' 6.5" (1.689 m)   Wt 247 lb 8 oz (112.3 kg)   SpO2 98%   BMI 39.35 kg/m  General:  Well developed, well nourished, in no apparent distress Skin:  Warm, no pallor or diaphoresis Lungs:  CTAB, no access msc use Cardio:  RRR, no bruits, no LE edema Psych: Age appropriate judgment and insight  Assessment:   Type 2 diabetes mellitus with hyperglycemia, without long-term current use of insulin (HCC) - Plan: Comprehensive metabolic panel, Lipid panel, Hemoglobin A1c  Hyperlipidemia LDL goal <100   Plan:   Chronic, stable. Cont metformin 500 mg/d. Counseled on diet and exercise. Chronic, stable. Cont Lipitor 40 mg/d.  F/u in 6  mo. The patient voiced understanding and agreement to the plan.  Jilda Roche [place], DO 08/06/22 9:45 AM

## 2022-08-06 NOTE — Patient Instructions (Signed)
Give us 2-3 business days to get the results of your labs back.   Keep the diet clean and stay active.  Let us know if you need anything. 

## 2022-08-07 ENCOUNTER — Telehealth: Payer: Self-pay | Admitting: *Deleted

## 2022-08-07 NOTE — Telephone Encounter (Signed)
Have spoke to the patient already.

## 2022-08-07 NOTE — Telephone Encounter (Signed)
Contact Type Call Who Is Calling Patient / Member / Family / Caregiver Caller Name Adelena Desantiago Caller Phone Number [phone] Call Type Message Only Information Provided Reason for Call Returning a Call from the Office Initial Comment Caller states she just missed a call. Additional Comment Office hours provided. Disp. Time Disposition Final User 08/07/2022 7:51:11 AM General Information Provided Yes T[place], [place]hely

## 2022-08-20 ENCOUNTER — Other Ambulatory Visit: Payer: Self-pay | Admitting: Family Medicine

## 2022-09-13 ENCOUNTER — Emergency Department (HOSPITAL_BASED_OUTPATIENT_CLINIC_OR_DEPARTMENT_OTHER)
Admission: EM | Admit: 2022-09-13 | Discharge: 2022-09-13 | Disposition: A | Discharge status: Home / Self Care | Payer: Medicare PPO | Attending: Emergency Medicine | Admitting: Emergency Medicine

## 2022-09-13 ENCOUNTER — Emergency Department (HOSPITAL_BASED_OUTPATIENT_CLINIC_OR_DEPARTMENT_OTHER): Payer: Medicare PPO

## 2022-09-13 ENCOUNTER — Encounter (HOSPITAL_BASED_OUTPATIENT_CLINIC_OR_DEPARTMENT_OTHER): Payer: Self-pay | Admitting: Emergency Medicine

## 2022-09-13 ENCOUNTER — Other Ambulatory Visit: Payer: Self-pay

## 2022-09-13 DIAGNOSIS — I10 Essential (primary) hypertension: Secondary | ICD-10-CM | POA: Insufficient documentation

## 2022-09-13 DIAGNOSIS — Z7982 Long term (current) use of aspirin: Secondary | ICD-10-CM | POA: Insufficient documentation

## 2022-09-13 DIAGNOSIS — Z79899 Other long term (current) drug therapy: Secondary | ICD-10-CM | POA: Insufficient documentation

## 2022-09-13 DIAGNOSIS — M79672 Pain in left foot: Secondary | ICD-10-CM | POA: Diagnosis not present

## 2022-09-13 DIAGNOSIS — M25572 Pain in left ankle and joints of left foot: Secondary | ICD-10-CM | POA: Diagnosis not present

## 2022-09-13 MED ORDER — NAPROXEN 500 MG PO TABS: 500.0000 mg | ORAL_TABLET | Freq: Two times a day (BID) | ORAL | 0 refills | Status: DC

## 2022-09-13 NOTE — ED Provider Notes (Signed)
[facility] EMERGENCY DEPARTMENT Provider Note   CSN: [id number] Arrival date & time: 09/13/22  1440     History  Chief Complaint  Patient presents with   Foot Pain    left    Sharon Yu is a 72 y.o. female.  72 year old female with a past medical history of HTN, Back pain presents to the ED with a chief complaint of left foot pain that is been ongoing since yesterday.  Patient reports severe pain to her dorsum aspect of her left foot exacerbated with ambulation along with weightbearing.  She has been taking ibuprofen to help with symptoms without much improvement.  No alleviating factors.  Patient placed her left foot on a walking boot.  She denies any prior history of gout, no fevers, no trauma, no other complaints.  The history is provided by the patient and medical records.  Foot Pain       Home Medications Prior to Admission medications   Medication Sig Start Date End Date Taking? Authorizing Provider  naproxen (NAPROSYN) 500 MG tablet Take 1 tablet (500 mg total) by mouth 2 (two) times daily for 7 days. 09/13/22 09/20/22 Yes Shykeem Resurreccion, Leonie Douglas, PA-C  alendronate (FOSAMAX) 70 MG tablet TAKE 1 TABLET BY MOUTH ONCE A WEEK, TAKE WITH A FULL GLASS OF WATER ON AN EMPTY STOMACH 08/02/22   Carmelia Roller, Jilda Roche, DO  aspirin 81 MG chewable tablet Chew 81 mg daily by mouth.    [provider]  atorvastatin (LIPITOR) 40 MG tablet Take 1 tablet (40 mg total) by mouth daily. 08/06/22   [name], DO  carvedilol (COREG) 25 MG tablet TAKE 1 TABLET BY MOUTH TWICE DAILY WITH A MEAL 06/24/22   Hilty, Lisette Abu, MD  ENTRESTO 97-103 MG Take 1 tablet by mouth twice daily 06/24/22   Hilty, Lisette Abu, MD  fluticasone (FLONASE) 50 MCG/ACT nasal spray Place 2 sprays into both nostrils daily. 08/20/22   [name], DO  furosemide (LASIX) 40 MG tablet Take 1 tablet (40 mg total) by mouth daily. 08/20/22   [name], DO  gabapentin (NEURONTIN)  300 MG capsule Take 300 mg by mouth 3 (three) times daily. 03/18/22   [provider]  hydrALAZINE (APRESOLINE) 25 MG tablet TAKE 1 TABLET BY MOUTH IN THE MORNING AND AT BEDTIME 06/24/22   Hilty, Lisette Abu, MD  meloxicam (MOBIC) 15 MG tablet Take 1 tablet (15 mg total) by mouth daily as needed for pain. 08/20/22   [name], DO  metFORMIN (GLUCOPHAGE) 500 MG tablet Take 1 tablet by mouth once daily with breakfast 06/24/22   [name], DO  Multiple Vitamin (MULITIVITAMIN WITH MINERALS) TABS Take 1 tablet by mouth daily.    [provider]  Potassium (POTASSIMIN PO) Take by mouth.    [provider]  spironolactone (ALDACTONE) 25 MG tablet Take 1 tablet (25 mg total) by mouth daily. 07/11/22 07/06/23  Chrystie Nose, MD  Vitamin D3 (VITAMIN D) 25 MCG tablet Take 1,000 Units by mouth daily.    [provider]  vitamin E 180 MG (400 UNITS) capsule Take 400 Units by mouth daily.    [provider]      Allergies    Other    Review of Systems   Review of Systems  Constitutional:  Negative for fever.  Musculoskeletal:  Positive for arthralgias.    Physical Exam Updated Vital Signs BP (!) 189/100 (BP Location: Left Arm)   Pulse 94  Temp 98.6 F (37 C) (Oral)   Resp 20   Ht 5\' 6"  (1.676 m)   Wt 112 kg   SpO2 100%   BMI 39.87 kg/m  Physical Exam Vitals and nursing note reviewed.  Constitutional:      Appearance: Normal appearance.  HENT:     Head: Normocephalic and atraumatic.  Eyes:     Pupils: Pupils are equal, round, and reactive to light.  Cardiovascular:     Pulses:          Dorsalis pedis pulses are 2+ on the left side.       Posterior tibial pulses are 2+ on the left side.     Comments: 2+ DP, PT pulses, sensation is intact throughout.  Good flexion and extension at the left ankle. Pulmonary:     Effort: Pulmonary effort is normal.  Abdominal:     General: Abdomen is flat.  Musculoskeletal:      Cervical back: Normal range of motion and neck supple.  Skin:    General: Skin is warm.  Neurological:     Mental Status: She is alert and oriented to person, place, and time.     ED Results / Procedures / Treatments   Labs (all labs ordered are listed, but only abnormal results are displayed) Labs Reviewed - No data to display  EKG None  Radiology DG Foot Complete Left  Result Date: 09/13/2022 CLINICAL DATA:  Left foot pain. EXAM: LEFT FOOT - COMPLETE 3+ VIEW COMPARISON:  None Available. FINDINGS: Degenerative disease involving most of the tarsal metatarsal joints and the hindfoot. Calcaneal spurs present. No fracture, dislocation, bony lesions or destruction. Soft tissues are unremarkable. IMPRESSION: Degenerative disease involving most of the tarsometatarsal joints and the hindfoot. Electronically Signed   By: Aletta Edouard M.D.   On: 09/13/2022 15:57   DG Ankle Complete Left  Result Date: 09/13/2022 CLINICAL DATA:  Left ankle pain. EXAM: LEFT ANKLE COMPLETE - 3+ VIEW COMPARISON:  None Available. FINDINGS: There is no evidence of fracture, dislocation, or joint effusion. Suggestion of soft tissue swelling, particularly overlying the medial aspect of the ankle. 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 Calcaneal spurs present. No bony lesions or destruction. IMPRESSION: Osteoarthritis of the ankle mortise and hindfoot. Suggestion of soft tissue swelling overlying the medial aspect of the ankle. Electronically Signed   By: Aletta Edouard M.D.   On: 09/13/2022 15:53    Procedures Procedures    Medications Ordered in ED Medications - No data to display  ED Course/ Medical Decision Making/ A&P                             Medical Decision Making Amount and/or Complexity of Data Reviewed Radiology: ordered.  Risk Prescription drug management.   Patient presents to the ED with a chief complaint of left foot pain which began this morning.  No history of trauma, no  fever, no prior history of gout.  On evaluation patient is neurovascularly intact with good pulses, good sensation throughout, good flexion and extension.  X-rays   Xray of the left foot showed: Degenerative disease involving most of the tarsometatarsal joints  and the hindfoot.   Xray of the left ankle showed: Osteoarthritis of the ankle mortise and hindfoot. Suggestion of soft  tissue swelling overlying the medial aspect of the ankle.   I discussed these results with patient, we discussed symptomatic treatment.  I do not suspect cellulitis at  this time, no streaking in the skin, no open sores.  There is some swelling noted, however this seems to be primarily along the lower foot.  No calf tenderness, no suspicion for a DVT at this time.  We discussed symptomatic treatment, she is requesting pain medication.  Provided with NSAIDs after checking her CMP with a normal creatinine.  Patient is hemodynamically stable for discharge.   Final Clinical Impression(s) / ED Diagnoses Final diagnoses:  Left foot pain    Rx / DC Orders ED Discharge Orders          Ordered    naproxen (NAPROSYN) 500 MG tablet  2 times daily        09/13/22 1917              Janeece Fitting, PA-C 09/13/22 1920    Gareth Morgan, MD 09/14/22 1122

## 2022-09-13 NOTE — ED Notes (Signed)
Pt placed in fast track room 2. Pt reports that she placed a boot from home on this morning.

## 2022-09-13 NOTE — Discharge Instructions (Signed)
The x-rays of your right foot did not show any acute findings.  You were prescribed medication to help treat your pain, please take 1 tablet twice a day with food for the next 7 days.

## 2022-09-13 NOTE — ED Triage Notes (Signed)
Recurrent left foot last night , presents with cam walker on given 3 years ago . Reports no injury or fall , Hx arthritis

## 2022-09-17 ENCOUNTER — Encounter: Payer: Self-pay | Admitting: Family Medicine

## 2022-09-17 ENCOUNTER — Other Ambulatory Visit: Payer: Self-pay | Admitting: Family Medicine

## 2022-09-17 ENCOUNTER — Ambulatory Visit: Payer: Medicare PPO | Admitting: Family Medicine

## 2022-09-17 VITALS — BP 134/82 | HR 92 | Temp 98.8°F | Ht 65.0 in | Wt 253.0 lb

## 2022-09-17 DIAGNOSIS — M79672 Pain in left foot: Secondary | ICD-10-CM

## 2022-09-17 LAB — URIC ACID: Uric Acid, Serum: 7 mg/dL (ref 2.4–7.0)

## 2022-09-17 MED ORDER — ALLOPURINOL 100 MG PO TABS: 100.0000 mg | ORAL_TABLET | Freq: Every day | ORAL | 6 refills | Status: DC

## 2022-09-17 MED ORDER — NAPROXEN 500 MG PO TABS: 500.0000 mg | ORAL_TABLET | Freq: Two times a day (BID) | ORAL | 0 refills | Status: AC

## 2022-09-17 NOTE — Patient Instructions (Addendum)
Give [place] 2-3 business days to get the results of your labs back.   Ice/cold pack over area for 10-15 min twice daily.  OK to take Tylenol 1000 mg (2 extra strength tabs) or 975 mg (3 regular strength tabs) every 6 hours as needed.  OK to continue the naproxen for now.   Foods to AVOID: Red meat, organ meat (liver), lunch meat, seafood (mussels, scallops, anchovies, etc) Alcohol Sugary foods/beverages (diet soft drinks have no link to flares)  Foods to migrate to: Dairy Vegetables Cherries have limited data to suggest they help lower uric acid levels (and prevent flares) Vit C (500 mg daily) may have a modest effect with preventing flares Poultry If you are going to eat red meat, beef and pork may give you less problems than lamb.  Let us know if you need anything.

## 2022-09-17 NOTE — Progress Notes (Signed)
Musculoskeletal Exam  Patient: Sharon Yu DOB: December 25, 1950  DOS: 09/17/2022  SUBJECTIVE:  Chief Complaint:   Chief Complaint  Patient presents with   follow-up ED, foot pain    Sharon Yu is a 72 y.o.  female for evaluation and treatment of L foot pain.   Onset:  1 week ago. No inj or change in activity.  Location: Top of the left foot Character:   soreness   Progression of issue:  has slightly improved Associated symptoms: swelling, warmth Treatment: to date has been OTC NSAIDS and prescription NSAIDS.   Neurovascular symptoms: no Her mother has a history of gout and takes allopurinol.  She was eating some red meat prior to this pain.  Past Medical History:  Diagnosis Date   Arthritis    Back pain    buldging disc   Chronic back pain    Hyperlipidemia    takes Crestor daily   Hypertension    takes Maxzide and Metoprolol daily along with Ramipril   Hypopotassemia    takes Potassium pill daily   Joint pain    Joint swelling    Nonischemic cardiomyopathy ([place])    a. 07/2017: EF reduced to 25-30%, cath showing normal cors.    Shortness of breath    with exertion    Objective: VITAL SIGNS: BP 134/82 (BP Location: Left Arm, Cuff Size: Large)   Pulse 92   Temp 98.8 F (37.1 C) (Oral)   Ht 5\' 5"  (1.651 m)   Wt 253 lb (114.8 kg)   SpO2 99%   BMI 42.10 kg/m  Constitutional: Well formed, well developed. No acute distress. Thorax & Lungs: No accessory muscle use Musculoskeletal: L foot  Tenderness to palpation: Yes over the dorsum of the forefoot on the left Deformity: no Ecchymosis: no Warmth: No Soft tissue edema: Yes Neurologic: Normal sensory function.  Gait is antalgic Psychiatric: Normal mood. Age appropriate judgment and insight. Alert & oriented x 3.    Assessment:  Left foot pain - Plan: Uric acid  Plan: Continue naproxen for now.  Check uric acid level as clinically it sounds like she had a gout flare.  Will send in allopurinol if she  needs maintenance. F/u as originally scheduled for now. The patient voiced understanding and agreement to the plan.   [place], DO 09/17/22  11:56 AM

## 2022-09-23 ENCOUNTER — Other Ambulatory Visit: Payer: Self-pay | Admitting: Family Medicine

## 2022-09-23 ENCOUNTER — Other Ambulatory Visit: Payer: Self-pay | Admitting: Internal Medicine

## 2022-10-18 ENCOUNTER — Ambulatory Visit: Payer: Medicare PPO | Admitting: Family Medicine

## 2022-11-03 ENCOUNTER — Emergency Department (HOSPITAL_BASED_OUTPATIENT_CLINIC_OR_DEPARTMENT_OTHER)
Admission: EM | Admit: 2022-11-03 | Discharge: 2022-11-03 | Disposition: A | Discharge status: Home / Self Care | Payer: Medicare PPO | Attending: Emergency Medicine | Admitting: Emergency Medicine

## 2022-11-03 ENCOUNTER — Encounter (HOSPITAL_BASED_OUTPATIENT_CLINIC_OR_DEPARTMENT_OTHER): Payer: Self-pay

## 2022-11-03 ENCOUNTER — Other Ambulatory Visit: Payer: Self-pay

## 2022-11-03 ENCOUNTER — Emergency Department (HOSPITAL_BASED_OUTPATIENT_CLINIC_OR_DEPARTMENT_OTHER): Payer: Medicare PPO

## 2022-11-03 DIAGNOSIS — M79601 Pain in right arm: Secondary | ICD-10-CM | POA: Diagnosis not present

## 2022-11-03 DIAGNOSIS — M25521 Pain in right elbow: Secondary | ICD-10-CM

## 2022-11-03 DIAGNOSIS — M25421 Effusion, right elbow: Secondary | ICD-10-CM | POA: Insufficient documentation

## 2022-11-03 DIAGNOSIS — Z7982 Long term (current) use of aspirin: Secondary | ICD-10-CM | POA: Insufficient documentation

## 2022-11-03 LAB — CBC WITH DIFFERENTIAL/PLATELET
Abs Immature Granulocytes: 0.03 10*3/uL (ref 0.00–0.07)
Basophils Absolute: 0 10*3/uL (ref 0.0–0.1)
Basophils Relative: 0 %
Eosinophils Absolute: 0.1 10*3/uL (ref 0.0–0.5)
Eosinophils Relative: 1 %
HCT: 37.2 % (ref 36.0–46.0)
Hemoglobin: 12.2 g/dL (ref 12.0–15.0)
Immature Granulocytes: 0 %
Lymphocytes Relative: 14 %
Lymphs Abs: 1.6 10*3/uL (ref 0.7–4.0)
MCH: 29.1 pg (ref 26.0–34.0)
MCHC: 32.8 g/dL (ref 30.0–36.0)
MCV: 88.8 fL (ref 80.0–100.0)
Monocytes Absolute: 1 10*3/uL (ref 0.1–1.0)
Monocytes Relative: 9 %
Neutro Abs: 8.2 10*3/uL — ABNORMAL HIGH (ref 1.7–7.7)
Neutrophils Relative %: 76 %
Platelets: 188 10*3/uL (ref 150–400)
RBC: 4.19 MIL/uL (ref 3.87–5.11)
RDW: 13.6 % (ref 11.5–15.5)
WBC: 10.9 10*3/uL — ABNORMAL HIGH (ref 4.0–10.5)
nRBC: 0 % (ref 0.0–0.2)

## 2022-11-03 LAB — SEDIMENTATION RATE: Sed Rate: 45 mm/hr — ABNORMAL HIGH (ref 0–22)

## 2022-11-03 LAB — C-REACTIVE PROTEIN: CRP: 0.7 mg/dL (ref <1.0)

## 2022-11-03 LAB — BASIC METABOLIC PANEL
Anion gap: 6 (ref 5–15)
BUN: 15 mg/dL (ref 8–23)
CO2: 26 mmol/L (ref 22–32)
Calcium: 8.5 mg/dL — ABNORMAL LOW (ref 8.9–10.3)
Chloride: 104 mmol/L (ref 98–111)
Creatinine, Ser: 0.79 mg/dL (ref 0.44–1.00)
GFR, Estimated: >60 mL/min (ref >60)
Glucose, Bld: 135 mg/dL — ABNORMAL HIGH (ref 70–99)
Potassium: 3.4 mmol/L — ABNORMAL LOW (ref 3.5–5.1)
Sodium: 136 mmol/L (ref 135–145)

## 2022-11-03 LAB — SYNOVIAL CELL COUNT + DIFF, W/ CRYSTALS
Crystals, Fluid: NONE SEEN
Eosinophils-Synovial: 0 % (ref 0–1)
Lymphocytes-Synovial Fld: 1 % (ref 0–20)
Monocyte-Macrophage-Synovial Fluid: 6 % — ABNORMAL LOW (ref 50–90)
Neutrophil, Synovial: 93 % — ABNORMAL HIGH (ref 0–25)
WBC, Synovial: 59850 /mm3 — ABNORMAL HIGH (ref 0–200)

## 2022-11-03 LAB — URIC ACID: Uric Acid, Serum: 5.4 mg/dL (ref 2.5–7.1)

## 2022-11-03 MED ORDER — PREDNISONE 20 MG PO TABS: ORAL_TABLET | ORAL | 0 refills | Status: DC

## 2022-11-03 MED ORDER — OXYCODONE-ACETAMINOPHEN 5-325 MG PO TABS: 1.0000 | ORAL_TABLET | ORAL | 0 refills | Status: DC | PRN

## 2022-11-03 MED ORDER — LIDOCAINE-EPINEPHRINE (PF) 2 %-1:200000 IJ SOLN
10.0000 mL | Freq: Once | INTRAMUSCULAR | Status: AC
  Administered 2022-11-03: 10 mL
  Filled 2022-11-03: qty 20

## 2022-11-03 MED ORDER — IBUPROFEN 400 MG PO TABS: 600.0000 mg | ORAL_TABLET | Freq: Once | ORAL | Status: DC

## 2022-11-03 MED ORDER — KETOROLAC TROMETHAMINE 15 MG/ML IJ SOLN
15.0000 mg | Freq: Once | INTRAMUSCULAR | Status: AC
  Administered 2022-11-03: 15 mg via INTRAVENOUS
  Filled 2022-11-03: qty 1

## 2022-11-03 NOTE — ED Provider Notes (Addendum)
Sharon Yu EMERGENCY DEPARTMENT AT [facility] Provider Note   CSN: [id number] Arrival date & time: 11/03/22  [id number]     History  Chief Complaint  Patient presents with   Arm Pain    Sharon Yu is a 72 y.o. female.  HPI Patient reports she has had right elbow pain for 2 days.  She denies any injury.  She reports the pain has become much worse over the past day.  Now it is in including discomfort into her forearm.  She reports it hurts if she moves her hand.  Patient has a prior history of gout.  She reports she was diagnosed with gout in her foot last year and takes allopurinol.  Patient denies she had any fever chills or constitutional symptoms.  Other than her swollen elbow she feels fine.    Home Medications Prior to Admission medications   Medication Sig Start Date End Date Taking? Authorizing Provider  allopurinol (ZYLOPRIM) 100 MG tablet Take 1 tablet (100 mg total) by mouth daily. 09/17/22   Shelda Pal, DO  oxyCODONE-acetaminophen (PERCOCET) 5-325 MG tablet Take 1 tablet by mouth every 4 (four) hours as needed. 11/03/22  Yes Charlesetta Shanks, MD  predniSONE (DELTASONE) 20 MG tablet 3 tabs po day one, then 2 po daily x 4 days 11/03/22  Yes Reverie Vaquera, Jeannie Done, MD  alendronate (FOSAMAX) 70 MG tablet TAKE 1 TABLET BY MOUTH ONCE A WEEK, TAKE WITH A FULL GLASS OF WATER ON AN EMPTY STOMACH 08/02/22   Nani Ravens, Crosby Oyster, DO  aspirin 81 MG chewable tablet Chew 81 mg daily by mouth.    [provider]  atorvastatin (LIPITOR) 40 MG tablet Take 1 tablet by mouth once daily 09/23/22   Nani Ravens, Crosby Oyster, DO  carvedilol (COREG) 25 MG tablet Take 1 tablet (25 mg total) by mouth 2 (two) times daily with a meal. Please schedule appointment for further refills 09/25/22   Pixie Casino, MD  fluticasone (FLONASE) 50 MCG/ACT nasal spray Place 2 sprays into both nostrils daily. 08/20/22   Shelda Pal, DO  furosemide (LASIX) 40 MG tablet Take 1 tablet  (40 mg total) by mouth daily. 08/20/22   Shelda Pal, DO  gabapentin (NEURONTIN) 300 MG capsule Take 300 mg by mouth 3 (three) times daily. 03/18/22   [provider]  hydrALAZINE (APRESOLINE) 25 MG tablet Take 1 tablet (25 mg total) by mouth 2 (two) times daily. Please schedule appointment for further refills 09/25/22   Pixie Casino, MD  meloxicam (MOBIC) 15 MG tablet Take 1 tablet (15 mg total) by mouth daily as needed for pain. 08/20/22   Shelda Pal, DO  metFORMIN (GLUCOPHAGE) 500 MG tablet Take 1 tablet by mouth once daily with breakfast 09/23/22   Shelda Pal, DO  Multiple Vitamin (MULITIVITAMIN WITH MINERALS) TABS Take 1 tablet by mouth daily.    [provider]  Potassium (POTASSIMIN PO) Take by mouth.    [provider]  sacubitril-valsartan (ENTRESTO) 97-103 MG Take 1 tablet by mouth 2 (two) times daily. Please schedule appointment for further refills 09/25/22   Pixie Casino, MD  spironolactone (ALDACTONE) 25 MG tablet Take 1 tablet (25 mg total) by mouth daily. 07/11/22 07/06/23  Pixie Casino, MD  Vitamin D3 (VITAMIN D) 25 MCG tablet Take 1,000 Units by mouth daily.    [provider]  vitamin E 180 MG (400 UNITS) capsule Take 400 Units by mouth daily.    [provider]      Allergies    Other    Review of Systems   Review of Systems  Physical Exam Updated Vital Signs BP (!) 161/80   Pulse 78   Temp 98.3 F (36.8 C)   Resp 16   Wt 114.3 kg   SpO2 95%   BMI 41.93 kg/m  Physical Exam Constitutional:      Comments: Alert nontoxic clinically well in appearance.  HENT:     Mouth/Throat:     Pharynx: Oropharynx is clear.  Eyes:     Extraocular Movements: Extraocular movements intact.  Pulmonary:     Effort: Pulmonary effort is normal.  Musculoskeletal:     Comments: Patient send moderate diffuse swelling of the elbow on the right.  No significant erythema.  Most tender at the lateral  epicondyle and area slightly superior.  She has some stiffness with range of motion but can flex to about 90 degrees.  Pain with complete extension.  No streaking down the arm.  No fullness or tenderness of the forearm.  Hand and wrist normal.  Range of motion with hand and wrist cause more pain in the elbow and the forearm.  Neurological:     General: No focal deficit present.     Mental Status: She is oriented to person, place, and time.     Coordination: Coordination normal.  Psychiatric:        Mood and Affect: Mood normal.     ED Results / Procedures / Treatments   Labs (all labs ordered are listed, but only abnormal results are displayed) Labs Reviewed  BASIC METABOLIC PANEL - Abnormal; Notable for the following components:      Result Value   Potassium 3.4 (*)    Glucose, Bld 135 (*)    Calcium 8.5 (*)    All other components within normal limits  CBC WITH DIFFERENTIAL/PLATELET - Abnormal; Notable for the following components:   WBC 10.9 (*)    Neutro Abs 8.2 (*)    All other components within normal limits  SEDIMENTATION RATE - Abnormal; Notable for the following components:   Sed Rate 45 (*)    All other components within normal limits  BODY FLUID CULTURE W GRAM STAIN  URIC ACID  C-REACTIVE PROTEIN  SYNOVIAL CELL COUNT + DIFF, W/ CRYSTALS    EKG None  Radiology DG Forearm Right  Result Date: 11/03/2022 CLINICAL DATA:  Right arm pain from the elbow to fingers that started 2 days ago. EXAM: RIGHT FOREARM - 2 VIEW COMPARISON:  None Available. FINDINGS: Elbow joint effusion. No visible fracture or erosion. Mild degenerative spurring at the elbow. Generalized osteopenia. IMPRESSION: Elbow joint effusion without fracture or erosion. Electronically Signed   By: Jorje Guild M.D.   On: 11/03/2022 07:26    Procedures .Joint Aspiration/Arthrocentesis  Date/Time: 11/03/2022 11:18 AM  Performed by: Charlesetta Shanks, MD Authorized by: Charlesetta Shanks, MD   Consent:     Consent obtained:  Verbal   Consent given by:  Patient   Risks discussed:  Bleeding, infection and pain Location:    Location:  Elbow   Elbow:  R elbow Anesthesia:    Anesthesia method:  Local infiltration   Local anesthetic:  Lidocaine 2% WITH epi Procedure details:    Preparation: Patient was prepped and draped in usual sterile fashion     Needle gauge:  18 G   Ultrasound guidance: no     Approach:  Lateral   Aspirate amount:  3  Aspirate characteristics:  Blood-tinged and yellow   Steroid injected: no     Specimen collected: yes   Post-procedure details:    Dressing:  Adhesive bandage   Procedure completion:  Tolerated well, no immediate complications     Medications Ordered in ED Medications  ketorolac (TORADOL) 15 MG/ML injection 15 mg (15 mg Intravenous Given 11/03/22 0823)  lidocaine-EPINEPHrine (XYLOCAINE W/EPI) 2 %-1:200000 (PF) injection 10 mL (10 mLs Infiltration Given by Other 11/03/22 1011)    ED Course/ Medical Decision Making/ A&P                             Medical Decision Making Amount and/or Complexity of Data Reviewed Labs: ordered.  Risk Prescription drug management.   Patient presents with a swollen and painful right elbow without trauma.  She does have history of gout and is on allopurinol.  Exam shows effusion but no erythema.  Differential diagnosis includes septic joint\gout\reactive arthritis.  Elbow x-ray reviewed by radiology and personally visualized by myself as well.  Positive for small effusion.  Osteopenia  Patient treated with Toradol for pain.  Patient has white count 10.9 sedimentation rate of 45.  Normal GFR.  Joint aspirate obtained to confirm gout versus septic arthritis.  Patient has previous diagnosis of gout by PCP but I do not see any confirmed crystal analysis.  Patient tolerated procedure well.  At this time she does not wish to wait for results.  She has her transportation here with her and reports she will find return  if needed.  At this time, with patient well and high suspicion for gout will discharge with plan for prednisone and pain control with Percocet.  I have also stressed expeditious follow-up with PCP for recheck of the elbow.    Addendum 16: 48 Results from elbow aspirate are negative for crystals and have WBC 59,850,  93% neutrophils.  Positive for reactive versus septic arthritis.  I have called the patient to discuss results.  She reports her elbow is feeling improved at this time and she can move it better less pain.  She is counseled that results are negative for gout and there is potential for infection in the joint based on results.  Patient reports she is seen at [facility] and understands she needs to call them first thing in the morning to get a appointment for further evaluation.  Also we reviewed the necessity to immediately return to [facility] for treatment and potential hospitalization if the elbow is getting more swollen, painful if she develops a fever or other concerning changes.  She voices understanding.     Final Clinical Impression(s) / ED Diagnoses Final diagnoses:  Right elbow pain  Effusion of right elbow    Rx / DC Orders ED Discharge Orders          Ordered    predniSONE (DELTASONE) 20 MG tablet        11/03/22 1115    oxyCODONE-acetaminophen (PERCOCET) 5-325 MG tablet  Every 4 hours PRN        11/03/22 1115              Charlesetta Shanks, MD 11/03/22 1126    Charlesetta Shanks, MD 11/03/22 1651

## 2022-11-03 NOTE — Discharge Instructions (Signed)
1.  You have swelling and pain in your right elbow.  Fluid was taken from the elbow in the emergency department.  Fluid was drawn to see if there is infection or gout in the elbow.  At this time I suspect it is gout.  I have lower suspicion for infection.  Must follow-up with your doctor within the next 1 to 2 days for recheck and follow-up on fluid results as well. 2.  You have been given a prescription to start prednisone for inflammation.  Take this as prescribed.  You may take 1 Percocet tablet every 4 hours for additional pain control.  Aply cool compresses to the elbow and elevate is much as possible. 3.  Return to the emergency department immediately if you get a fever, feel generally sick his elbow is getting more painful or difficult to move.

## 2022-11-03 NOTE — ED Triage Notes (Addendum)
Pt arrives with c/o right arm pain that started at her elbow and goes down into her fingers that started 2 days ago. Pt denies injury.

## 2022-11-05 ENCOUNTER — Encounter: Payer: Self-pay | Admitting: Family Medicine

## 2022-11-05 ENCOUNTER — Ambulatory Visit: Payer: Medicare PPO | Admitting: Family Medicine

## 2022-11-05 VITALS — BP 128/74 | HR 96 | Temp 98.7°F | Ht 65.0 in | Wt 254.0 lb

## 2022-11-05 DIAGNOSIS — M25521 Pain in right elbow: Secondary | ICD-10-CM

## 2022-11-05 NOTE — Progress Notes (Signed)
Musculoskeletal Exam  Patient: Sharon Yu DOB: [birth year]  DOS: 11/05/2022  SUBJECTIVE:  Chief Complaint:   Chief Complaint  Patient presents with   Follow-up    ED Right arm pain    Adelle Dewire is a 72 y.o.  female for evaluation and treatment of R arm pain.   Onset:  4 days ago. No inj or change in activity.  Location: R elbow Character:  throbbing  Progression of issue:  has improved Associated symptoms: Swelling improved, decreased flexion/ext at elbow No redness or bruising, fevers Treatment: to date has been prednisone and ice, heat, opiates.   Neurovascular symptoms: no  Past Medical History:  Diagnosis Date   Arthritis    Back pain    buldging disc   Chronic back pain    Hyperlipidemia    takes Crestor daily   Hypertension    takes Maxzide and Metoprolol daily along with Ramipril   Hypopotassemia    takes Potassium pill daily   Joint pain    Joint swelling    Nonischemic cardiomyopathy ([place])    a. 07/2017: EF reduced to 25-30%, cath showing normal cors.    Shortness of breath    with exertion    Objective: VITAL SIGNS: BP 128/74 (BP Location: Left Arm, Cuff Size: Large)   Pulse 96   Temp 98.7 F (37.1 C) (Oral)   Ht '5\' 5"'$  (1.651 m)   Wt 254 lb (115.2 kg)   SpO2 97%   BMI 42.27 kg/m  Constitutional: Well formed, well developed. No acute distress. Thorax & Lungs: No accessory muscle use Musculoskeletal: R elbow.   Normal active range of motion: no- decreased flexion/ext Tenderness to palpation: mild ttp over lat epicondyle There is pain with resisted wrist extension Deformity: no Ecchymosis: no I do not appreciate any edema, erythema, excessive warmth Neurologic: Normal sensory function. No focal deficits noted. Psychiatric: Normal mood. Age appropriate judgment and insight. Alert & oriented x 3.    Assessment:  Right elbow pain  Plan: Continue current medication, forearm strap, heat, ice, Tylenol. Has appt w ortho next week  though I doubt she will need it.  F/u prn. The patient voiced understanding and agreement to the plan.   [place], DO 11/05/22  3:27 PM

## 2022-11-05 NOTE — Patient Instructions (Addendum)
Ice/cold pack over area for 10-15 min twice daily.  OK to take Tylenol 1000 mg (2 extra strength tabs) or 975 mg (3 regular strength tabs) every 6 hours as needed.  Consider a forearm strap (Band-IT) to help with your elbow. This can give your elbow a break and allow it to heal faster.  Seek immediate care if worsening. No signs of infection on follow up fluid analysis.   Let us know if you need anything.

## 2022-11-06 LAB — BODY FLUID CULTURE W GRAM STAIN: Culture: NO GROWTH

## 2022-11-20 DIAGNOSIS — M25521 Pain in right elbow: Secondary | ICD-10-CM | POA: Diagnosis not present

## 2022-12-18 ENCOUNTER — Telehealth: Payer: Self-pay | Admitting: Family Medicine

## 2022-12-18 NOTE — Telephone Encounter (Signed)
Contacted Ignacia Felling to schedule their annual wellness visit. Appointment made for 01/01/2023.  Verlee Rossetti; Care Guide Ambulatory Clinical Support [facility] Direct Dial: [phone]

## 2022-12-23 ENCOUNTER — Other Ambulatory Visit: Payer: Self-pay | Admitting: Family Medicine

## 2022-12-23 ENCOUNTER — Other Ambulatory Visit: Payer: Self-pay | Admitting: Internal Medicine

## 2022-12-23 NOTE — Telephone Encounter (Signed)
Rx request sent to pharmacy.  

## 2022-12-27 ENCOUNTER — Other Ambulatory Visit (HOSPITAL_BASED_OUTPATIENT_CLINIC_OR_DEPARTMENT_OTHER): Payer: Self-pay | Admitting: Family Medicine

## 2022-12-27 DIAGNOSIS — Z1231 Encounter for screening mammogram for malignant neoplasm of breast: Secondary | ICD-10-CM

## 2023-01-01 ENCOUNTER — Ambulatory Visit (INDEPENDENT_AMBULATORY_CARE_PROVIDER_SITE_OTHER): Payer: Medicare PPO | Admitting: *Deleted

## 2023-01-01 VITALS — Ht 65.0 in | Wt 254.0 lb

## 2023-01-01 DIAGNOSIS — Z0000 Encounter for general adult medical examination without abnormal findings: Secondary | ICD-10-CM | POA: Diagnosis not present

## 2023-01-01 NOTE — Patient Instructions (Signed)
[name] , Thank you for taking time to come for your Medicare Wellness Visit. I appreciate your ongoing commitment to your health goals. Please review the following plan we discussed and let me know if I can assist you in the future.      This is a list of the screening recommended for you and due dates:  Health Maintenance  Topic Date Due   COVID-19 Vaccine (7 - 2023-24 season) 07/29/2022   Eye exam for diabetics  01/04/2023   Yearly kidney health urinalysis for diabetes  02/05/2023   Complete foot exam   02/05/2023   Hemoglobin A1C  02/05/2023   Flu Shot  04/03/2023   Yearly kidney function blood test for diabetes  11/03/2023   Colon Cancer Screening  11/24/2023   Medicare Annual Wellness Visit  01/01/2024   Mammogram  01/15/2024   DTaP/Tdap/Td vaccine (3 - Td or Tdap) 01/18/2032   Pneumonia Vaccine  Completed   DEXA scan (bone density measurement)  Completed   Hepatitis C Screening: USPSTF Recommendation to screen - Ages 66-79 yo.  Completed   Zoster (Shingles) Vaccine  Completed   HPV Vaccine  Aged Out    Next appointment: Follow up in one year for your annual wellness visit.   Preventive Care 41 Years and Older, Female Preventive care refers to lifestyle choices and visits with your health care provider that can promote health and wellness. What does preventive care include? A yearly physical exam. This is also called an annual well check. Dental exams once or twice a year. Routine eye exams. Ask your health care provider how often you should have your eyes checked. Personal lifestyle choices, including: Daily care of your teeth and gums. Regular physical activity. Eating a healthy diet. Avoiding tobacco and drug use. Limiting alcohol use. Practicing safe sex. Taking low-dose aspirin every day. Taking vitamin and mineral supplements as recommended by your health care provider. What happens during an annual well check? The services and screenings done by your  health care provider during your annual well check will depend on your age, overall health, lifestyle risk factors, and family history of disease. Counseling  Your health care provider may ask you questions about your: Alcohol use. Tobacco use. Drug use. Emotional well-being. Home and relationship well-being. Sexual activity. Eating habits. History of falls. Memory and ability to understand (cognition). Work and work Astronomer. Reproductive health. Screening  You may have the following tests or measurements: Height, weight, and BMI. Blood pressure. Lipid and cholesterol levels. These may be checked every 5 years, or more frequently if you are over 64 years old. Skin check. Lung cancer screening. You may have this screening every year starting at age 78 if you have a 30-pack-year history of smoking and currently smoke or have quit within the past 15 years. Fecal occult blood test (FOBT) of the stool. You may have this test every year starting at age 76. Flexible sigmoidoscopy or colonoscopy. You may have a sigmoidoscopy every 5 years or a colonoscopy every 10 years starting at age 17. Hepatitis C blood test. Hepatitis B blood test. Sexually transmitted disease (STD) testing. Diabetes screening. This is done by checking your blood sugar (glucose) after you have not eaten for a while (fasting). You may have this done every 1-3 years. Bone density scan. This is done to screen for osteoporosis. You may have this done starting at age 27. Mammogram. This may be done every 1-2 years. Talk to your health care provider about how often  you should have regular mammograms. Talk with your health care provider about your test results, treatment options, and if necessary, the need for more tests. Vaccines  Your health care provider may recommend certain vaccines, such as: Influenza vaccine. This is recommended every year. Tetanus, diphtheria, and acellular pertussis (Tdap, Td) vaccine. You may  need a Td booster every 10 years. Zoster vaccine. You may need this after age 29. Pneumococcal 13-valent conjugate (PCV13) vaccine. One dose is recommended after age 61. Pneumococcal polysaccharide (PPSV23) vaccine. One dose is recommended after age 99. Talk to your health care provider about which screenings and vaccines you need and how often you need them. This information is not intended to replace advice given to you by your health care provider. Make sure you discuss any questions you have with your health care provider. Document Released: 09/15/2015 Document Revised: 05/08/2016 Document Reviewed: 06/20/2015 Elsevier Interactive Patient Education  2017 [employer] Prevention in the Home Falls can cause injuries. They can happen to people of all ages. There are many things you can do to make your home safe and to help prevent falls. What can I do on the outside of my home? Regularly fix the edges of walkways and driveways and fix any cracks. Remove anything that might make you trip as you walk through a door, such as a raised step or threshold. Trim any bushes or trees on the path to your home. Use bright outdoor lighting. Clear any walking paths of anything that might make someone trip, such as rocks or tools. Regularly check to see if handrails are loose or broken. Make sure that both sides of any steps have handrails. Any raised decks and porches should have guardrails on the edges. Have any leaves, snow, or ice cleared regularly. Use sand or salt on walking paths during winter. Clean up any spills in your garage right away. This includes oil or grease spills. What can I do in the bathroom? Use night lights. Install grab bars by the toilet and in the tub and shower. Do not use towel bars as grab bars. Use non-skid mats or decals in the tub or shower. If you need to sit down in the shower, use a plastic, non-slip stool. Keep the floor dry. Clean up any water that spills on  the floor as soon as it happens. Remove soap buildup in the tub or shower regularly. Attach bath mats securely with double-sided non-slip rug tape. Do not have throw rugs and other things on the floor that can make you trip. What can I do in the bedroom? Use night lights. Make sure that you have a light by your bed that is easy to reach. Do not use any sheets or blankets that are too big for your bed. They should not hang down onto the floor. Have a firm chair that has side arms. You can use this for support while you get dressed. Do not have throw rugs and other things on the floor that can make you trip. What can I do in the kitchen? Clean up any spills right away. Avoid walking on wet floors. Keep items that you use a lot in easy-to-reach places. If you need to reach something above you, use a strong step stool that has a grab bar. Keep electrical cords out of the way. Do not use floor polish or wax that makes floors slippery. If you must use wax, use non-skid floor wax. Do not have throw rugs and other things on  the floor that can make you trip. What can I do with my stairs? Do not leave any items on the stairs. Make sure that there are handrails on both sides of the stairs and use them. Fix handrails that are broken or loose. Make sure that handrails are as long as the stairways. Check any carpeting to make sure that it is firmly attached to the stairs. Fix any carpet that is loose or worn. Avoid having throw rugs at the top or bottom of the stairs. If you do have throw rugs, attach them to the floor with carpet tape. Make sure that you have a light switch at the top of the stairs and the bottom of the stairs. If you do not have them, ask someone to add them for you. What else can I do to help prevent falls? Wear shoes that: Do not have high heels. Have rubber bottoms. Are comfortable and fit you well. Are closed at the toe. Do not wear sandals. If you use a stepladder: Make sure  that it is fully opened. Do not climb a closed stepladder. Make sure that both sides of the stepladder are locked into place. Ask someone to hold it for you, if possible. Clearly mark and make sure that you can see: Any grab bars or handrails. First and last steps. Where the edge of each step is. Use tools that help you move around (mobility aids) if they are needed. These include: Canes. Walkers. Scooters. Crutches. Turn on the lights when you go into a dark area. Replace any light bulbs as soon as they burn out. Set up your furniture so you have a clear path. Avoid moving your furniture around. If any of your floors are uneven, fix them. If there are any pets around you, be aware of where they are. Review your medicines with your doctor. Some medicines can make you feel dizzy. This can increase your chance of falling. Ask your doctor what other things that you can do to help prevent falls. This information is not intended to replace advice given to you by your health care provider. Make sure you discuss any questions you have with your health care provider. Document Released: 06/15/2009 Document Revised: 01/25/2016 Document Reviewed: 09/23/2014 Elsevier Interactive Patient Education  2017 [employer].

## 2023-01-01 NOTE — Progress Notes (Signed)
Subjective:   Sharon Yu is a 72 y.o. female who presents for Medicare Annual (Subsequent) preventive examination.  I connected with  Ignacia Felling on 01/01/23 by a audio enabled telemedicine application and verified that I am speaking with the correct person using two identifiers.  Patient Location: Home  Provider Location: Office/Clinic  I discussed the limitations of evaluation and management by telemedicine. The patient expressed understanding and agreed to proceed.   Review of Systems     Cardiac Risk Factors include: advanced age (>57men, >44 women);diabetes mellitus;dyslipidemia;hypertension     Objective:    Today's Vitals   01/01/23 1344  Weight: 254 lb (115.2 kg)  Height: 5\' 5"  (1.651 m)   Body mass index is 42.27 kg/m.     01/01/2023    1:39 PM 11/03/2022    6:36 AM 12/27/2021   10:24 AM 12/20/2020   10:30 AM 11/22/2019   11:41 AM 05/29/2018    5:24 PM 07/15/2017   12:57 AM  Advanced Directives  Does Patient Have a Medical Advance Directive? Yes No Yes No No No No  Type of Estate agent of [place];Living will  Healthcare Power of [place];Out of facility DNR (pink MOST or yellow form);Living will      Does patient want to make changes to medical advance directive? No - Patient declined        Copy of Healthcare Power of Attorney in Chart? Yes - validated most recent copy scanned in chart (See row information)  Yes - validated most recent copy scanned in chart (See row information)      Would patient like information on creating a medical advance directive?  No - Patient declined  Yes (MAU/Ambulatory/Procedural Areas - Information given) No - Patient declined No - Patient declined No - Patient declined    Current Medications (verified) Outpatient Encounter Medications as of 01/01/2023  Medication Sig   alendronate (FOSAMAX) 70 MG tablet TAKE 1 TABLET BY MOUTH ONCE A WEEK TAKE  WITH  A  FULL  GLASS  OF  WATER  ON  AN  EMPTY  STOMACH    allopurinol (ZYLOPRIM) 100 MG tablet Take 1 tablet (100 mg total) by mouth daily.   aspirin 81 MG chewable tablet Chew 81 mg daily by mouth.   atorvastatin (LIPITOR) 40 MG tablet Take 1 tablet by mouth once daily   carvedilol (COREG) 25 MG tablet Take 1 tablet (25 mg total) by mouth 2 (two) times daily with a meal.   fluticasone (FLONASE) 50 MCG/ACT nasal spray Place 2 sprays into both nostrils daily.   furosemide (LASIX) 40 MG tablet Take 1 tablet (40 mg total) by mouth daily.   gabapentin (NEURONTIN) 300 MG capsule Take 300 mg by mouth 3 (three) times daily.   hydrALAZINE (APRESOLINE) 25 MG tablet Take 1 tablet (25 mg total) by mouth 2 (two) times daily.   meloxicam (MOBIC) 15 MG tablet Take 1 tablet (15 mg total) by mouth daily as needed for pain.   metFORMIN (GLUCOPHAGE) 500 MG tablet Take 1 tablet by mouth once daily with breakfast   Multiple Vitamin (MULITIVITAMIN WITH MINERALS) TABS Take 1 tablet by mouth daily.   oxyCODONE-acetaminophen (PERCOCET) 5-325 MG tablet Take 1 tablet by mouth every 4 (four) hours as needed.   Potassium (POTASSIMIN PO) Take by mouth.   predniSONE (DELTASONE) 20 MG tablet 3 tabs po day one, then 2 po daily x 4 days   sacubitril-valsartan (ENTRESTO) 97-103 MG Take 1 tablet by mouth  2 (two) times daily.   spironolactone (ALDACTONE) 25 MG tablet Take 1 tablet (25 mg total) by mouth daily.   Vitamin D3 (VITAMIN D) 25 MCG tablet Take 1,000 Units by mouth daily.   vitamin E 180 MG (400 UNITS) capsule Take 400 Units by mouth daily.   No facility-administered encounter medications on file as of 01/01/2023.    Allergies (verified) Other   History: Past Medical History:  Diagnosis Date   Arthritis    Back pain    buldging disc   Chronic back pain    Hyperlipidemia    takes Crestor daily   Hypertension    takes Maxzide and Metoprolol daily along with Ramipril   Hypopotassemia    takes Potassium pill daily   Joint pain    Joint swelling    Nonischemic  cardiomyopathy (HCC)    a. 07/2017: EF reduced to 25-30%, cath showing normal cors.    Shortness of breath    with exertion   Past Surgical History:  Procedure Laterality Date   bilateral knee arthroscopies     carpel tunnel     bilateral   CHOLECYSTECTOMY     colonscopy     JOINT REPLACEMENT Left    RIGHT/LEFT HEART CATH AND CORONARY ANGIOGRAPHY N/A 07/17/2017   Procedure: RIGHT/LEFT HEART CATH AND CORONARY ANGIOGRAPHY;  Surgeon: [place], Peter M, MD;  Location: [facility] INVASIVE CV LAB;  Service: Cardiovascular;  Laterality: N/A;   TOTAL KNEE ARTHROPLASTY Right 02/19/2013   Procedure: TOTAL KNEE ARTHROPLASTY;  Surgeon: Thera Flake., MD;  Location: [facility] OR;  Service: Orthopedics;  Laterality: Right;  right total knee arthroplasty   TOTAL KNEE REVISION Left 07/02/2013   Procedure: LEFT TOTAL KNEE TIBIA REVISION;  Surgeon: Nestor Lewandowsky, MD;  Location: [facility] OR;  Service: Orthopedics;  Laterality: Left;   Family History  Problem Relation Age of Onset   Hypertension Mother    Arthritis Mother    Asthma Mother    Diabetes Mother    Hyperlipidemia Mother    Cancer Mother        uterine cancer   CAD Sister    Heart failure Sister    Hyperlipidemia Sister    Heart disease Sister    Early death Father    Asthma Brother    COPD Brother    Depression Brother    AAA (abdominal aortic aneurysm) Daughter    Hearing loss Daughter    Early death Daughter 12       Spinal miningitis   Social History   Socioeconomic History   Marital status: Widowed    Spouse name: Not on file   Number of children: Not on file   Years of education: Not on file   Highest education level: Not on file  Occupational History   Occupation: retired  Tobacco Use   Smoking status: Never   Smokeless tobacco: Never  Substance and Sexual Activity   Alcohol use: No   Drug use: No   Sexual activity: Yes    Birth control/protection: Post-menopausal  Other Topics Concern   Not on file  Social History Narrative    Not on file   Social Determinants of Health   Financial Resource Strain: Low Risk  (12/27/2021)   Overall Financial Resource Strain (CARDIA)    Difficulty of Paying Living Expenses: Not hard at all  Food Insecurity: No Food Insecurity (01/01/2023)   Hunger Vital Sign    Worried About Running Out of Food in the Last Year:  Never true    Ran Out of Food in the Last Year: Never true  Transportation Needs: No Transportation Needs (01/01/2023)   PRAPARE - Administrator, Civil Service (Medical): No    Lack of Transportation (Non-Medical): No  Physical Activity: Sufficiently Active (12/20/2020)   Exercise Vital Sign    Days of Exercise per Week: 3 days    Minutes of Exercise per Session: 60 min  Stress: No Stress Concern Present (12/27/2021)   [employer] of Occupational Health - Occupational Stress Questionnaire    Feeling of Stress : Not at all  Social Connections: Moderately Integrated (12/27/2021)   Social Connection and Isolation Panel [NHANES]    Frequency of Communication with Friends and Family: More than three times a week    Frequency of Social Gatherings with Friends and Family: More than three times a week    Attends Religious Services: More than 4 times per year    Active Member of [employer] or Organizations: Yes    Attends Banker Meetings: More than 4 times per year    Marital Status: Widowed    Tobacco Counseling Counseling given: Not Answered   Clinical Intake:  Pre-visit preparation completed: Yes  Pain : No/denies pain  BMI - recorded: 42.27 Nutritional Status: BMI > 30  Obese Nutritional Risks: None Diabetes: Yes CBG done?: No Did pt. bring in CBG monitor from home?: No  How often do you need to have someone help you when you read instructions, pamphlets, or other written materials from your doctor or pharmacy?: 1 - Never  Activities of Daily Living    01/01/2023    1:43 PM  In your present state of health, do you have any difficulty  performing the following activities:  Hearing? 0  Vision? 0  Difficulty concentrating or making decisions? 0  Walking or climbing stairs? 0  Dressing or bathing? 0  Doing errands, shopping? 0  Preparing Food and eating ? N  Using the Toilet? N  In the past six months, have you accidently leaked urine? N  Do you have problems with loss of bowel control? N  Managing your Medications? N  Managing your Finances? N  Housekeeping or managing your Housekeeping? N    Patient Care Team: [name], DO as PCP - General (Family Medicine) Rennis Golden Lisette Abu, MD as PCP - Cardiology (Cardiology)  Indicate any recent Medical Services you may have received from other than [facility] providers in the past year (date may be approximate).     Assessment:   This is a routine wellness examination for [facility].  Hearing/Vision screen No results found.  Dietary issues and exercise activities discussed: Current Exercise Habits: Home exercise routine;Structured exercise class (Silver Sneakers), Type of exercise: walking;Other - see comments;strength training/weights (stationary bike), Time (Minutes): 60, Frequency (Times/Week): 5, Weekly Exercise (Minutes/Week): 300, Intensity: Mild, Exercise limited by: None identified   Goals Addressed   None    Depression Screen    01/01/2023    1:43 PM 08/06/2022    9:38 AM 02/04/2022    9:42 AM 12/27/2021   10:26 AM 01/24/2021    8:49 AM 12/20/2020   10:33 AM 12/11/2020   10:21 AM  PHQ 2/9 Scores  PHQ - 2 Score 1 1 0 1 0 1 0  PHQ- 9 Score  1 2        Fall Risk    01/01/2023    1:42 PM 09/17/2022   11:30 AM 08/06/2022  9:38 AM 02/04/2022    9:42 AM 12/27/2021   10:24 AM  Fall Risk   Falls in the past year? 0 0 0 0 0  Number falls in past yr: 0 0 0 0 0  Injury with Fall? 0 0 0 0 0  Risk for fall due to : No Fall Risks No Fall Risks No Fall Risks No Fall Risks No Fall Risks  Follow up Falls evaluation completed  Falls evaluation completed Falls evaluation  completed Falls evaluation completed    FALL RISK PREVENTION PERTAINING TO THE HOME:  Any stairs in or around the home? Yes  If so, are there any without handrails? No  Home free of loose throw rugs in walkways, pet beds, electrical cords, etc? Yes  Adequate lighting in your home to reduce risk of falls? Yes   ASSISTIVE DEVICES UTILIZED TO PREVENT FALLS:  Life alert? No  Use of a cane, walker or w/c?  Uses can if walking a long distance Grab bars in the bathroom? No  Shower chair or bench in shower? Yes  Elevated toilet seat or a handicapped toilet?  Comfort height  TIMED UP AND GO:  Was the test performed?  No, audio visit .   Cognitive Function:        01/01/2023    1:50 PM 12/27/2021   10:37 AM  6CIT Screen  What Year? 0 points 0 points  What month? 0 points 0 points  What time? 0 points 0 points  Count back from 20 0 points 0 points  Months in reverse 0 points 0 points  Repeat phrase 0 points 0 points  Total Score 0 points 0 points    Immunizations Immunization History  Administered Date(s) Administered   Fluad Quad(high Dose 65+) 05/31/2020   Influenza-Unspecified 05/13/2019, 05/31/2021, 05/19/2022   PFIZER(Purple Top)SARS-COV-2 Vaccination 10/07/2019, 10/28/2019, 06/02/2020, 01/17/2021, 06/03/2021   PNEUMOCOCCAL CONJUGATE-20 01/17/2022   Pfizer Covid-19 Vaccine Bivalent Booster 66yrs & up 06/03/2022   Pneumococcal Conjugate-13 04/27/2018   Pneumococcal Polysaccharide-23 06/09/2014, 10/15/2018   Tdap 10/15/2018, 01/17/2022   Zoster Recombinat (Shingrix) 06/09/2017, 06/09/2018    TDAP status: Up to date  Flu Vaccine status: Up to date  Pneumococcal vaccine status: Up to date  Covid-19 vaccine status: Information provided on how to obtain vaccines.   Qualifies for Shingles Vaccine? Yes   Zostavax completed No   Shingrix Completed?: Yes  Screening Tests Health Maintenance  Topic Date Due   COVID-19 Vaccine (7 - 2023-24 season) 07/29/2022   Medicare  Annual Wellness (AWV)  12/28/2022   OPHTHALMOLOGY EXAM  01/04/2023   Diabetic kidney evaluation - Urine ACR  02/05/2023   FOOT EXAM  02/05/2023   HEMOGLOBIN A1C  02/05/2023   INFLUENZA VACCINE  04/03/2023   Diabetic kidney evaluation - eGFR measurement  11/03/2023   COLONOSCOPY (Pts 45-39yrs Insurance coverage will need to be confirmed)  11/24/2023   MAMMOGRAM  01/15/2024   DTaP/Tdap/Td (3 - Td or Tdap) 01/18/2032   Pneumonia Vaccine 44+ Years old  Completed   DEXA SCAN  Completed   Hepatitis C Screening  Completed   Zoster Vaccines- Shingrix  Completed   HPV VACCINES  Aged Out    Health Maintenance  Health Maintenance Due  Topic Date Due   COVID-19 Vaccine (7 - 2023-24 season) 07/29/2022   Medicare Annual Wellness (AWV)  12/28/2022    Colorectal cancer screening: Type of screening: Colonoscopy. Completed 11/23/13. Repeat every 10 years  Mammogram status: Completed 01/14/22. Repeat every year  Bone  Density status: Completed 01/14/22. Results reflect: Bone density results: NORMAL. Repeat every 2 years.  Lung Cancer Screening: (Low Dose CT Chest recommended if Age 41-80 years, 30 pack-year currently smoking OR have quit w/in 15years.) does not qualify.   Additional Screening:  Hepatitis C Screening: does qualify; Completed 10/15/18  Vision Screening: Recommended annual ophthalmology exams for early detection of glaucoma and other disorders of the eye. Is the patient up to date with their annual eye exam?  Yes  Who is the provider or what is the name of the office in which the patient attends annual eye exams? Dr. Renaldo Fiddler If pt is not established with a provider, would they like to be referred to a provider to establish care? No .   Dental Screening: Recommended annual dental exams for proper oral hygiene  Community Resource Referral / Chronic Care Management: CRR required this visit?  No   CCM required this visit?  No      Plan:     I have personally reviewed and noted  the following in the patient's chart:   Medical and social history Use of alcohol, tobacco or illicit drugs  Current medications and supplements including opioid prescriptions. Patient is currently taking opioid prescriptions. Information provided to patient regarding non-opioid alternatives. Patient advised to discuss non-opioid treatment plan with their provider. Functional ability and status Nutritional status Physical activity Advanced directives List of other physicians Hospitalizations, surgeries, and ER visits in previous 12 months Vitals Screenings to include cognitive, depression, and falls Referrals and appointments  In addition, I have reviewed and discussed with patient certain preventive protocols, quality metrics, and best practice recommendations. A written personalized care plan for preventive services as well as general preventive health recommendations were provided to patient.   Due to this being a telephonic visit, the after visit summary with patients personalized plan was offered to patient via mail or my-chart. Patient declined at this time.  Donne Anon, [place]   01/01/2023   Nurse Notes: None

## 2023-01-09 DIAGNOSIS — H25813 Combined forms of age-related cataract, bilateral: Secondary | ICD-10-CM | POA: Diagnosis not present

## 2023-01-09 DIAGNOSIS — H04123 Dry eye syndrome of bilateral lacrimal glands: Secondary | ICD-10-CM | POA: Diagnosis not present

## 2023-01-09 DIAGNOSIS — H43811 Vitreous degeneration, right eye: Secondary | ICD-10-CM | POA: Diagnosis not present

## 2023-01-09 DIAGNOSIS — H524 Presbyopia: Secondary | ICD-10-CM | POA: Diagnosis not present

## 2023-01-09 DIAGNOSIS — H52223 Regular astigmatism, bilateral: Secondary | ICD-10-CM | POA: Diagnosis not present

## 2023-01-09 DIAGNOSIS — E119 Type 2 diabetes mellitus without complications: Secondary | ICD-10-CM | POA: Diagnosis not present

## 2023-01-09 DIAGNOSIS — H53042 Amblyopia suspect, left eye: Secondary | ICD-10-CM | POA: Diagnosis not present

## 2023-01-20 ENCOUNTER — Ambulatory Visit
Admission: RE | Admit: 2023-01-20 | Discharge: 2023-01-20 | Disposition: A | Discharge status: Home / Self Care | Payer: Medicare PPO | Source: Ambulatory Visit | Attending: Family Medicine | Admitting: Family Medicine

## 2023-01-20 ENCOUNTER — Encounter (HOSPITAL_BASED_OUTPATIENT_CLINIC_OR_DEPARTMENT_OTHER): Payer: Self-pay

## 2023-01-20 DIAGNOSIS — I5022 Chronic systolic (congestive) heart failure: Secondary | ICD-10-CM | POA: Diagnosis not present

## 2023-01-20 DIAGNOSIS — I10 Essential (primary) hypertension: Secondary | ICD-10-CM | POA: Insufficient documentation

## 2023-01-20 DIAGNOSIS — I428 Other cardiomyopathies: Secondary | ICD-10-CM | POA: Insufficient documentation

## 2023-01-20 DIAGNOSIS — Z1231 Encounter for screening mammogram for malignant neoplasm of breast: Secondary | ICD-10-CM | POA: Diagnosis not present

## 2023-01-20 DIAGNOSIS — E785 Hyperlipidemia, unspecified: Secondary | ICD-10-CM | POA: Diagnosis not present

## 2023-01-21 ENCOUNTER — Ambulatory Visit: Payer: Medicare PPO | Admitting: Internal Medicine

## 2023-01-21 ENCOUNTER — Encounter: Payer: Self-pay | Admitting: Student

## 2023-01-21 ENCOUNTER — Ambulatory Visit: Payer: Medicare PPO | Admitting: Student

## 2023-01-21 VITALS — BP 138/70 | HR 95 | Ht 65.0 in | Wt 248.6 lb

## 2023-01-21 DIAGNOSIS — E785 Hyperlipidemia, unspecified: Secondary | ICD-10-CM | POA: Diagnosis not present

## 2023-01-21 DIAGNOSIS — I10 Essential (primary) hypertension: Secondary | ICD-10-CM | POA: Diagnosis not present

## 2023-01-21 DIAGNOSIS — I5022 Chronic systolic (congestive) heart failure: Secondary | ICD-10-CM

## 2023-01-21 DIAGNOSIS — I428 Other cardiomyopathies: Secondary | ICD-10-CM

## 2023-01-21 NOTE — Patient Instructions (Signed)
Medication Instructions:  Your physician recommends that you continue on your current medications as directed. Please refer to the Current Medication list given to you today.  *If you need a refill on your cardiac medications before your next appointment, please call your pharmacy*   Lab Work: NONE If you have labs (blood work) drawn today and your tests are completely normal, you will receive your results only by: MyChart Message (if you have MyChart) OR A paper copy in the mail If you have any lab test that is abnormal or we need to change your treatment, we will call you to review the results.   Testing/Procedures: NONE   Follow-Up: At [facility], you and your health needs are our priority.  As part of our continuing mission to provide you with exceptional heart care, we have created designated Provider Care Teams.  These Care Teams include your primary Cardiologist (physician) and Advanced Practice Providers (APPs -  Physician Assistants and Nurse Practitioners) who all work together to provide you with the care you need, when you need it.  We recommend signing up for the patient portal called "MyChart".  Sign up information is provided on this After Visit Summary.  MyChart is used to connect with patients for Virtual Visits (Telemedicine).  Patients are able to view lab/test results, encounter notes, upcoming appointments, etc.  Non-urgent messages can be sent to your provider as well.   To learn more about what you can do with MyChart, go to [email or web address].    Your next appointment:   6 month(s)  Provider:   Kenneth C Hilty, MD    

## 2023-01-21 NOTE — Progress Notes (Signed)
Cardiology Clinic Note   Date: 01/21/2023 ID: Shamella, Moglia 19-Jul-1951, MRN [id number]  Primary Cardiologist:  Chrystie Nose, MD  Patient Profile    Sharon Yu is a 72 y.o. female who presents to the clinic today for routine follow up.   Past medical history significant for: Chronic systolic heart failure/nonischemic cardiomyopathy. Echo 10/15/2021: EF 40 to 45%.  Grade I DD.  Normal RV function.  Moderate AI.  Mild dilatation of ascending aorta 40 mm.  No significant change from prior study February 2022. Ascending aortic aneurysm. Hypertension. Hyperlipidemia. Lipid panel 08/06/2022: LDL 55, HDL 49, TG 92, total 122. T2DM.   History of Present Illness    Sharon Yu was first evaluated by cardiology on 07/15/2017 for CHF during hospitalization.  Echo showed EF 25 to 30%.  R/LHC showed normal coronaries with severe LV dysfunction, normal LV filling pressures, normal right heart pressures, and preserved cardiac output.  Patient continues to be followed by Dr. Rennis Golden for the above outlined history.  Patient was last seen in the office by Dr. Rennis Golden on 07/11/2022 for follow-up.  She reported increased stress secondary to the recent passing of her brother.  She was hypertensive in the office and spironolactone was added.  Today, patient is doing well. No chest pain, pressure, or tightness. Denies lower extremity edema, orthopnea, or PND. No palpitations. Patient reports dyspnea with heavy exertion but okay with normal activities. 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  She rides the stationary bike on Mondays and Wednesdays, on Tuesday she does low impact exercises, and Thursday she bikes and does light weights.  She will skip Lasix doses if she has appointments and when she goes to church but does not notice increased edema as it is typically 1 day and then she restarted it the day after.    ROS: All other systems reviewed and are otherwise  negative except as noted in History of Present Illness.  Studies Reviewed    ECG is not ordered today.         Physical Exam    VS:  BP 138/70   Pulse 95   Ht 5\' 5"  (1.651 m)   Wt 248 lb 9.6 oz (112.8 kg)   SpO2 98%   BMI 41.37 kg/m  , BMI Body mass index is 41.37 kg/m.  GEN: Well nourished, well developed, in no acute distress. Neck: No JVD or carotid bruits. Cardiac:  RRR. No murmurs. No rubs or gallops.   Respiratory:  Respirations regular and unlabored. Clear to auscultation without rales, wheezing or rhonchi. GI: Soft, nontender, nondistended. Extremities: Radials/DP/PT 2+ and equal bilaterally. No clubbing or cyanosis. No edema.  Skin: Warm and dry, no rash. Neuro: Strength intact.  Assessment & Plan    Chronic systolic heart failure/nonischemic cardiomyopathy.  Normal coronaries per angiography November 2018.  Echo February 2023 showed EF 40 to 45%.  Patient denies lower extremity edema, orthopnea, PND.  She reports dyspnea with heavy exertion but okay with normal activities.  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  No lower extremity edema.  Euvolemic and well compensated on exam.  Continue Entresto, carvedilol, spironolactone, Lasix, hydralazine.  Patient was intolerant to Jardiance. Hypertension. BP today 138/70 on intake at 132/80 on my recheck.  Patient denies headaches, dizziness or vision changes. Continue Entresto, carvedilol, hydralazine. Hyperlipidemia.  LDL December 2023 55, at goal.  Continue atorvastatin.  Disposition: Return in 6 months  or sooner as needed.         Signed, Etta Grandchild. Penelopi Mikrut, DNP, NP-C

## 2023-01-28 ENCOUNTER — Ambulatory Visit: Payer: Medicare PPO | Admitting: Internal Medicine

## 2023-02-07 ENCOUNTER — Ambulatory Visit (INDEPENDENT_AMBULATORY_CARE_PROVIDER_SITE_OTHER): Payer: Medicare PPO | Admitting: Family Medicine

## 2023-02-07 ENCOUNTER — Encounter: Payer: Self-pay | Admitting: Family Medicine

## 2023-02-07 VITALS — BP 121/70 | HR 93 | Temp 98.0°F | Ht 65.0 in | Wt 250.0 lb

## 2023-02-07 DIAGNOSIS — Z0000 Encounter for general adult medical examination without abnormal findings: Secondary | ICD-10-CM | POA: Diagnosis not present

## 2023-02-07 DIAGNOSIS — E65 Localized adiposity: Secondary | ICD-10-CM

## 2023-02-07 DIAGNOSIS — M1A0720 Idiopathic chronic gout, left ankle and foot, without tophus (tophi): Secondary | ICD-10-CM | POA: Diagnosis not present

## 2023-02-07 DIAGNOSIS — E1165 Type 2 diabetes mellitus with hyperglycemia: Secondary | ICD-10-CM

## 2023-02-07 DIAGNOSIS — Z7984 Long term (current) use of oral hypoglycemic drugs: Secondary | ICD-10-CM | POA: Diagnosis not present

## 2023-02-07 LAB — LIPID PANEL
Cholesterol: 108 mg/dL (ref 0–200)
HDL: 45.2 mg/dL (ref >39.00)
LDL Cholesterol: 44 mg/dL (ref 0–99)
NonHDL: 62.78
Total CHOL/HDL Ratio: 2
Triglycerides: 92 mg/dL (ref 0.0–149.0)
VLDL: 18.4 mg/dL (ref 0.0–40.0)

## 2023-02-07 LAB — URIC ACID: Uric Acid, Serum: 4.6 mg/dL (ref 2.4–7.0)

## 2023-02-07 LAB — MICROALBUMIN / CREATININE URINE RATIO
Creatinine,U: 187 mg/dL
Microalb Creat Ratio: 5.6 mg/g (ref 0.0–30.0)
Microalb, Ur: 10.4 mg/dL — ABNORMAL HIGH (ref 0.0–1.9)

## 2023-02-07 LAB — COMPREHENSIVE METABOLIC PANEL
ALT: 18 U/L (ref 0–35)
AST: 15 U/L (ref 0–37)
Albumin: 4.2 g/dL (ref 3.5–5.2)
Alkaline Phosphatase: 98 U/L (ref 39–117)
BUN: 19 mg/dL (ref 6–23)
CO2: 26 mEq/L (ref 19–32)
Calcium: 9.1 mg/dL (ref 8.4–10.5)
Chloride: 106 mEq/L (ref 96–112)
Creatinine, Ser: 0.93 mg/dL (ref 0.40–1.20)
GFR: 62.48 mL/min (ref >60.00)
Glucose, Bld: 108 mg/dL — ABNORMAL HIGH (ref 70–99)
Potassium: 4.2 mEq/L (ref 3.5–5.1)
Sodium: 141 mEq/L (ref 135–145)
Total Bilirubin: 0.4 mg/dL (ref 0.2–1.2)
Total Protein: 7.2 g/dL (ref 6.0–8.3)

## 2023-02-07 LAB — CBC
HCT: 39.7 % (ref 36.0–46.0)
Hemoglobin: 12.6 g/dL (ref 12.0–15.0)
MCHC: 31.7 g/dL (ref 30.0–36.0)
MCV: 91.2 fl (ref 78.0–100.0)
Platelets: 183 10*3/uL (ref 150.0–400.0)
RBC: 4.35 Mil/uL (ref 3.87–5.11)
RDW: 14.7 % (ref 11.5–15.5)
WBC: 8.7 10*3/uL (ref 4.0–10.5)

## 2023-02-07 LAB — HEMOGLOBIN A1C: Hgb A1c MFr Bld: 6.1 % (ref 4.6–6.5)

## 2023-02-07 MED ORDER — ATORVASTATIN CALCIUM 40 MG PO TABS: 40.0000 mg | ORAL_TABLET | Freq: Every day | ORAL | 3 refills | Status: DC

## 2023-02-07 MED ORDER — PREDNISONE 20 MG PO TABS: 40.0000 mg | ORAL_TABLET | Freq: Every day | ORAL | 0 refills | Status: DC

## 2023-02-07 MED ORDER — METFORMIN HCL 500 MG PO TABS: 500.0000 mg | ORAL_TABLET | Freq: Every day | ORAL | 3 refills | Status: DC

## 2023-02-07 NOTE — Patient Instructions (Addendum)
Give [place] 2-3 business days to get the results of your labs back.   Keep the diet clean and stay active.  Please get me a copy of your advanced directive form at your convenience.   If you do not hear anything about your referral in the next 1-2 weeks, call our office and ask for an update.  Foods to AVOID: Red meat, organ meat (liver), lunch meat, seafood (mussels, scallops, anchovies, etc) Alcohol Sugary foods/beverages (diet soft drinks have no link to flares)  Foods to migrate to: Dairy Vegetables Cherries have limited data to suggest they help lower uric acid levels (and prevent flares) Vit C (500 mg daily) may have a modest effect with preventing flares Poultry If you are going to eat red meat, beef and pork may give you less problems than lamb.  Let us know if you need anything.

## 2023-02-07 NOTE — Progress Notes (Signed)
Chief Complaint  Patient presents with   Annual Exam   Gout     Well Woman Veronica Denault is here for a complete physical.   Her last physical was >1 year ago.  Current diet: in general, a "healthy" diet. Current exercise: wt lifting, cardio. Weight is stable and she denies daytime fatigue. Seatbelt? Yes Advanced directive? Yes  Health Maintenance Colonoscopy- Yes Shingrix- Yes DEXA- Yes Mammogram- Yes Tetanus- Yes Pneumonia- Yes Hep C screen- Yes  Gout flare of L foot starting 6 d ago. No obvious trigger. Compliant w allopurinol 100 mg/d. Feels arm and swollen. Not much redness or bruising.  No trauma. Has not had a flare in >6 mo. Took some ibuprofen that did help.   Past Medical History:  Diagnosis Date   Arthritis    Back pain    buldging disc   Chronic back pain    Hyperlipidemia    takes Crestor daily   Hypertension    takes Maxzide and Metoprolol daily along with Ramipril   Hypopotassemia    takes Potassium pill daily   Joint pain    Joint swelling    Nonischemic cardiomyopathy (HCC)    a. 07/2017: EF reduced to 25-30%, cath showing normal cors.    Shortness of breath    with exertion     Past Surgical History:  Procedure Laterality Date   bilateral knee arthroscopies     carpel tunnel     bilateral   CHOLECYSTECTOMY     colonscopy     JOINT REPLACEMENT Left    RIGHT/LEFT HEART CATH AND CORONARY ANGIOGRAPHY N/A 07/17/2017   Procedure: RIGHT/LEFT HEART CATH AND CORONARY ANGIOGRAPHY;  Surgeon: [place], Peter M, MD;  Location: [facility] INVASIVE CV LAB;  Service: Cardiovascular;  Laterality: N/A;   TOTAL KNEE ARTHROPLASTY Right 02/19/2013   Procedure: TOTAL KNEE ARTHROPLASTY;  Surgeon: Thera Flake., MD;  Location: [facility] OR;  Service: Orthopedics;  Laterality: Right;  right total knee arthroplasty   TOTAL KNEE REVISION Left 07/02/2013   Procedure: LEFT TOTAL KNEE TIBIA REVISION;  Surgeon: Nestor Lewandowsky, MD;  Location: [facility] OR;  Service: Orthopedics;  Laterality:  Left;    Medications  Current Outpatient Medications on File Prior to Visit  Medication Sig Dispense Refill   alendronate (FOSAMAX) 70 MG tablet TAKE 1 TABLET BY MOUTH ONCE A WEEK TAKE  WITH  A  FULL  GLASS  OF  WATER  ON  AN  EMPTY  STOMACH 12 tablet 0   allopurinol (ZYLOPRIM) 100 MG tablet Take 1 tablet (100 mg total) by mouth daily. 30 tablet 6   aspirin 81 MG chewable tablet Chew 81 mg daily by mouth.     atorvastatin (LIPITOR) 40 MG tablet Take 1 tablet by mouth once daily 90 tablet 0   carvedilol (COREG) 25 MG tablet Take 1 tablet (25 mg total) by mouth 2 (two) times daily with a meal. 180 tablet 1   fluticasone (FLONASE) 50 MCG/ACT nasal spray Place 2 sprays into both nostrils daily. 16 g 5   furosemide (LASIX) 40 MG tablet Take 1 tablet (40 mg total) by mouth daily. 90 tablet 1   gabapentin (NEURONTIN) 300 MG capsule Take 300 mg by mouth 3 (three) times daily.     hydrALAZINE (APRESOLINE) 25 MG tablet Take 1 tablet (25 mg total) by mouth 2 (two) times daily. 180 tablet 1   meloxicam (MOBIC) 15 MG tablet Take 1 tablet (15 mg total) by mouth daily as needed  for pain. 30 tablet 0   metFORMIN (GLUCOPHAGE) 500 MG tablet Take 1 tablet by mouth once daily with breakfast 90 tablet 0   Multiple Vitamin (MULITIVITAMIN WITH MINERALS) TABS Take 1 tablet by mouth daily.     oxyCODONE-acetaminophen (PERCOCET) 5-325 MG tablet Take 1 tablet by mouth every 4 (four) hours as needed. 15 tablet 0   Potassium (POTASSIMIN PO) Take by mouth.     predniSONE (DELTASONE) 20 MG tablet 3 tabs po day one, then 2 po daily x 4 days 11 tablet 0   sacubitril-valsartan (ENTRESTO) 97-103 MG Take 1 tablet by mouth 2 (two) times daily. 180 tablet 1   spironolactone (ALDACTONE) 25 MG tablet Take 1 tablet (25 mg total) by mouth daily. 90 tablet 3   Vitamin D3 (VITAMIN D) 25 MCG tablet Take 1,000 Units by mouth daily.     vitamin E 180 MG (400 UNITS) capsule Take 400 Units by mouth daily.     Allergies Allergies   Allergen Reactions   Other Hives and Swelling    "Grapple" (hybrid apple & grape)    Review of Systems: Constitutional:  no fevers Eye:  no recent significant change in vision Ears:  No changes in hearing Nose/Mouth/Throat:  no complaints of nasal congestion, no sore throat Cardiovascular: no chest pain Respiratory:  No shortness of breath Gastrointestinal:  No change in bowel habits GU:  Female: negative for dysuria Integumentary:  no abnormal skin lesions reported Neurologic:  no headaches Endocrine:  denies unexplained weight changes  Exam BP 121/70 (BP Location: Left Arm, Patient Position: Sitting, Cuff Size: Large)   Pulse 93   Temp 98 F (36.7 C) (Oral)   Ht 5\' 5"  (1.651 m)   Wt 250 lb (113.4 kg)   SpO2 98%   BMI 41.60 kg/m  General:  well developed, well nourished, in no apparent distress MSK: +ttp over dorsum of L midfoot medially Skin: +warmth and edema over dorsum of L foot; excess tissue over R lat dorsi region without ttp or warmth/fluctuance; otherwise no significant moles, warts, or growths Head:  no masses, lesions, or tenderness Eyes:  pupils equal and round, sclera anicteric without injection Ears:  canals without lesions, TMs shiny without retraction, no obvious effusion, no erythema Nose:  nares patent, mucosa normal, and no drainage Throat/Pharynx:  lips and gingiva without lesion; tongue and uvula midline; non-inflamed pharynx; no exudates or postnasal drainage Neck: neck supple without adenopathy, thyromegaly, or masses Lungs:  clear to auscultation, breath sounds equal bilaterally, no respiratory distress Cardio:  regular rate and rhythm, no bruits or LE edema Abdomen:  abdomen soft, nontender; bowel sounds normal; no masses or organomegaly Genital: Deferred Neuro: sensation intact to pinprick b/l, gait antalgic; deep tendon reflexes normal and symmetric Psych: well oriented with normal range of affect and appropriate judgment/insight  Assessment  and Plan  Well adult exam  Type 2 diabetes mellitus with hyperglycemia, without long-term current use of insulin (HCC) - Plan: CBC, Comprehensive metabolic panel, Lipid panel, Hemoglobin A1c, Microalbumin / creatinine urine ratio  Chronic gout of left foot, unspecified cause - Plan: Uric acid   Well 72 y.o. female. Counseled on diet and exercise. Other orders as above. Gout: Exacerbation of chronic issue. 5 d pred burst 40 mg/d. See what uric acid is today.  Refer plastics for consultation on excess skin/fatty removal on upper R torso. She understands this may not be covered by ins.  Follow up in 6 mo. The patient voiced understanding and agreement to  the plan.  Jilda Roche [place], DO 02/07/23 10:15 AM

## 2023-02-12 ENCOUNTER — Telehealth: Payer: Self-pay

## 2023-02-12 NOTE — Telephone Encounter (Signed)
Reminder letter has been mailed to the patient  

## 2023-02-13 ENCOUNTER — Telehealth: Payer: Self-pay | Admitting: Family Medicine

## 2023-02-13 MED ORDER — PREDNISONE 20 MG PO TABS: 40.0000 mg | ORAL_TABLET | Freq: Every day | ORAL | 0 refills | Status: AC

## 2023-02-13 NOTE — Telephone Encounter (Signed)
No. Her uric acid is in a very good range, increasing it will not help her in any way.

## 2023-02-13 NOTE — Telephone Encounter (Signed)
Patient called and would like to increase the medication that was given for the GOUT that she has in her feet. Please advise

## 2023-02-13 NOTE — Telephone Encounter (Signed)
If it is significantly better, I will send in a few more days. If not that much better, please sched her with me tomorrow. Ty.

## 2023-02-13 NOTE — Telephone Encounter (Signed)
Pt notified .   Pt states she finished the steroid, and foot is starting to feel better, but foot is still swollen and tender to touch , wants to know what to do.  Denied appt to discuss issue.

## 2023-02-14 NOTE — Telephone Encounter (Signed)
Patient informed of PCP instructions. She is feeling better today She will pickup prescription and start it Getting better and hopefully by Monday it will be better. If not she was advised to schedule an appointment with PCP

## 2023-03-11 DIAGNOSIS — M25551 Pain in right hip: Secondary | ICD-10-CM | POA: Diagnosis not present

## 2023-03-11 DIAGNOSIS — M25561 Pain in right knee: Secondary | ICD-10-CM | POA: Diagnosis not present

## 2023-03-11 DIAGNOSIS — M545 Low back pain, unspecified: Secondary | ICD-10-CM | POA: Diagnosis not present

## 2023-03-17 ENCOUNTER — Institutional Professional Consult (permissible substitution): Payer: Medicare PPO | Admitting: Plastic Surgery

## 2023-03-21 DIAGNOSIS — M5416 Radiculopathy, lumbar region: Secondary | ICD-10-CM | POA: Diagnosis not present

## 2023-03-27 ENCOUNTER — Other Ambulatory Visit: Payer: Self-pay | Admitting: Orthopedic Surgery

## 2023-03-27 DIAGNOSIS — M545 Low back pain, unspecified: Secondary | ICD-10-CM

## 2023-04-03 ENCOUNTER — Other Ambulatory Visit: Payer: Self-pay | Admitting: Family Medicine

## 2023-04-16 ENCOUNTER — Ambulatory Visit
Admission: RE | Admit: 2023-04-16 | Discharge: 2023-04-16 | Disposition: A | Discharge status: Home / Self Care | Payer: Medicare PPO | Source: Ambulatory Visit | Attending: Orthopedic Surgery | Admitting: Orthopedic Surgery

## 2023-04-16 DIAGNOSIS — M48061 Spinal stenosis, lumbar region without neurogenic claudication: Secondary | ICD-10-CM | POA: Diagnosis not present

## 2023-04-16 DIAGNOSIS — M5126 Other intervertebral disc displacement, lumbar region: Secondary | ICD-10-CM | POA: Diagnosis not present

## 2023-04-16 DIAGNOSIS — M545 Low back pain, unspecified: Secondary | ICD-10-CM

## 2023-04-16 DIAGNOSIS — M47816 Spondylosis without myelopathy or radiculopathy, lumbar region: Secondary | ICD-10-CM | POA: Diagnosis not present

## 2023-05-09 DIAGNOSIS — M5416 Radiculopathy, lumbar region: Secondary | ICD-10-CM | POA: Diagnosis not present

## 2023-05-19 ENCOUNTER — Other Ambulatory Visit: Payer: Self-pay | Admitting: Orthopedic Surgery

## 2023-05-20 ENCOUNTER — Other Ambulatory Visit: Payer: Self-pay | Admitting: Family Medicine

## 2023-05-21 NOTE — Pre-Procedure Instructions (Signed)
Surgical Instructions   Your procedure is scheduled on Wednesday, October 2nd. Report to [facility] Main Entrance "A" at 05:30 A.M., then check in with the Admitting office. Any questions or running late day of surgery: call [phone]  Questions prior to your surgery date: call [phone], Monday-Friday, 8am-4pm. If you experience any cold or flu symptoms such as cough, fever, chills, shortness of breath, etc. between now and your scheduled surgery, please notify us at the above number.     Remember:  Do not eat after midnight the night before your surgery  You may drink clear liquids until 05:30 AM the morning of your surgery.   Clear liquids allowed are: Water, Non-Citrus Juices (without pulp), Carbonated Beverages, Clear Tea, Black Coffee Only (NO MILK, CREAM OR POWDERED CREAMER of any kind), and Gatorade.  Patient Instructions  The night before surgery:  No food after midnight. ONLY clear liquids after midnight   The day of surgery (if you have diabetes): Drink ONE (1) 12 oz G2 given to you in your pre admission testing appointment by 05:30 AM the morning of surgery. Drink in one sitting. Do not sip.  This drink was given to you during your hospital  pre-op appointment visit.  Nothing else to drink after completing the  12 oz bottle of G2.         If you have questions, please contact your surgeon's office.    Take these medicines the morning of surgery with A SIP OF WATER  atorvastatin (LIPITOR)  allopurinol (ZYLOPRIM)  carvedilol (COREG)  hydrALAZINE (APRESOLINE)  RESTASIS    May take these medicines IF NEEDED: fluticasone (FLONASE)    Follow your surgeon's instructions on when to stop Aspirin.  If no instructions were given by your surgeon then you will need to call the office to get those instructions.     One week prior to surgery, STOP taking any Aleve, meloxicam (MOBIC), Naproxen, Ibuprofen, Motrin, Advil, Goody's, BC's, all herbal medications, fish oil,  and non-prescription vitamins.   WHAT DO I DO ABOUT MY DIABETES MEDICATION?   Do not take metFORMIN (GLUCOPHAGE) the morning of surgery.    HOW TO MANAGE YOUR DIABETES BEFORE AND AFTER SURGERY  Why is it important to control my blood sugar before and after surgery? Improving blood sugar levels before and after surgery helps healing and can limit problems. A way of improving blood sugar control is eating a healthy diet by:  Eating less sugar and carbohydrates  Increasing activity/exercise  Talking with your doctor about reaching your blood sugar goals High blood sugars (greater than 180 mg/dL) can raise your risk of infections and slow your recovery, so you will need to focus on controlling your diabetes during the weeks before surgery. Make sure that the doctor who takes care of your diabetes knows about your planned surgery including the date and location.  How do I manage my blood sugar before surgery? Check your blood sugar at least 4 times a day, starting 2 days before surgery, to make sure that the level is not too high or low.  Check your blood sugar the morning of your surgery when you wake up and every 2 hours until you get to the Short Stay unit.  If your blood sugar is less than 70 mg/dL, you will need to treat for low blood sugar: Do not take insulin. Treat a low blood sugar (less than 70 mg/dL) with  cup of clear juice (cranberry or apple), 4 glucose tablets, OR glucose  gel. Recheck blood sugar in 15 minutes after treatment (to make sure it is greater than 70 mg/dL). If your blood sugar is not greater than 70 mg/dL on recheck, call [phone] for further instructions. Report your blood sugar to the short stay nurse when you get to Short Stay.  If you are admitted to the hospital after surgery: Your blood sugar will be checked by the staff and you will probably be given insulin after surgery (instead of oral diabetes medicines) to make sure you have good blood sugar  levels. The goal for blood sugar control after surgery is 80-180 mg/dL.                    Do NOT Smoke (Tobacco/Vaping) for 24 hours prior to your procedure.  If you use a CPAP at night, you may bring your mask/headgear for your overnight stay.   You will be asked to remove any contacts, glasses, piercing's, hearing aid's, dentures/partials prior to surgery. Please bring cases for these items if needed.    Patients discharged the day of surgery will not be allowed to drive home, and someone needs to stay with them for 24 hours.  SURGICAL WAITING ROOM VISITATION Patients may have no more than 2 support people in the waiting area - these visitors may rotate.   Pre-op nurse will coordinate an appropriate time for 1 ADULT support person, who may not rotate, to accompany patient in pre-op.  Children under the age of 85 must have an adult with them who is not the patient and must remain in the main waiting area with an adult.  If the patient needs to stay at the hospital during part of their recovery, the visitor guidelines for inpatient rooms apply.  Please refer to the [facility] website for the visitor guidelines for any additional information.   If you received a COVID test during your pre-op visit  it is requested that you wear a mask when out in public, stay away from anyone that may not be feeling well and notify your surgeon if you develop symptoms. If you have been in contact with anyone that has tested positive in the last 10 days please notify you surgeon.      Pre-operative 5 CHG Bathing Instructions   You can play a key role in reducing the risk of infection after surgery. Your skin needs to be as free of germs as possible. You can reduce the number of germs on your skin by washing with CHG (chlorhexidine gluconate) soap before surgery. CHG is an antiseptic soap that kills germs and continues to kill germs even after washing.   DO NOT use if you have an allergy to  chlorhexidine/CHG or antibacterial soaps. If your skin becomes reddened or irritated, stop using the CHG and notify one of our RNs at [phone].   Please shower with the CHG soap starting 4 days before surgery using the following schedule:     Please keep in mind the following:  DO NOT shave, including legs and underarms, starting the day of your first shower.   You may shave your face at any point before/day of surgery.  Place clean sheets on your bed the day you start using CHG soap. Use a clean washcloth (not used since being washed) for each shower. DO NOT sleep with pets once you start using the CHG.   CHG Shower Instructions:  Wash your face and private area with normal soap. If you choose to wash your  hair, wash first with your normal shampoo.  After you use shampoo/soap, rinse your hair and body thoroughly to remove shampoo/soap residue.  Turn the water OFF and apply about 3 tablespoons (45 ml) of CHG soap to a CLEAN washcloth.  Apply CHG soap ONLY FROM YOUR NECK DOWN TO YOUR TOES (washing for 3-5 minutes)  DO NOT use CHG soap on face, private areas, open wounds, or sores.  Pay special attention to the area where your surgery is being performed.  If you are having back surgery, having someone wash your back for you may be helpful. Wait 2 minutes after CHG soap is applied, then you may rinse off the CHG soap.  Pat dry with a clean towel  Put on clean clothes/pajamas   If you choose to wear lotion, please use ONLY the CHG-compatible lotions on the back of this paper.   Additional instructions for the day of surgery: DO NOT APPLY any lotions, deodorants, cologne, or perfumes.   Do not bring valuables to the hospital. [facility] is not responsible for any belongings/valuables. Do not wear nail polish, gel polish, artificial nails, or any other type of covering on natural nails (fingers and toes) Do not wear jewelry or makeup Put on clean/comfortable clothes.  Please brush your  teeth.  Ask your nurse before applying any prescription medications to the skin.     CHG Compatible Lotions   Aveeno Moisturizing lotion  Cetaphil Moisturizing Cream  Cetaphil Moisturizing Lotion  Clairol Herbal Essence Moisturizing Lotion, Dry Skin  Clairol Herbal Essence Moisturizing Lotion, Extra Dry Skin  Clairol Herbal Essence Moisturizing Lotion, Normal Skin  Curel Age Defying Therapeutic Moisturizing Lotion with Alpha Hydroxy  Curel Extreme Care Body Lotion  Curel Soothing Hands Moisturizing Hand Lotion  Curel Therapeutic Moisturizing Cream, Fragrance-Free  Curel Therapeutic Moisturizing Lotion, Fragrance-Free  Curel Therapeutic Moisturizing Lotion, Original Formula  Eucerin Daily Replenishing Lotion  Eucerin Dry Skin Therapy Plus Alpha Hydroxy Crme  Eucerin Dry Skin Therapy Plus Alpha Hydroxy Lotion  Eucerin Original Crme  Eucerin Original Lotion  Eucerin Plus Crme Eucerin Plus Lotion  Eucerin TriLipid Replenishing Lotion  Keri Anti-Bacterial Hand Lotion  Keri Deep Conditioning Original Lotion Dry Skin Formula Softly Scented  Keri Deep Conditioning Original Lotion, Fragrance Free Sensitive Skin Formula  Keri Lotion Fast Absorbing Fragrance Free Sensitive Skin Formula  Keri Lotion Fast Absorbing Softly Scented Dry Skin Formula  Keri Original Lotion  Keri Skin Renewal Lotion Keri Silky Smooth Lotion  Keri Silky Smooth Sensitive Skin Lotion  Nivea Body Creamy Conditioning Oil  Nivea Body Extra Enriched Lotion  Nivea Body Original Lotion  Nivea Body Sheer Moisturizing Lotion Nivea Crme  Nivea Skin Firming Lotion  NutraDerm 30 Skin Lotion  NutraDerm Skin Lotion  NutraDerm Therapeutic Skin Cream  NutraDerm Therapeutic Skin Lotion  ProShield Protective Hand Cream  Provon moisturizing lotion  Please read over the following fact sheets that you were given.

## 2023-05-22 ENCOUNTER — Encounter (HOSPITAL_COMMUNITY)
Admission: RE | Admit: 2023-05-22 | Discharge: 2023-05-22 | Disposition: A | Discharge status: Home / Self Care | Payer: Medicare PPO | Source: Ambulatory Visit | Attending: Orthopedic Surgery | Admitting: Orthopedic Surgery

## 2023-05-22 ENCOUNTER — Other Ambulatory Visit: Payer: Self-pay

## 2023-05-22 ENCOUNTER — Encounter (HOSPITAL_COMMUNITY): Payer: Self-pay

## 2023-05-22 ENCOUNTER — Telehealth: Payer: Self-pay | Admitting: Family Medicine

## 2023-05-22 VITALS — BP 147/74 | HR 78 | Temp 98.3°F | Resp 18 | Ht 66.0 in | Wt 246.7 lb

## 2023-05-22 DIAGNOSIS — E1165 Type 2 diabetes mellitus with hyperglycemia: Secondary | ICD-10-CM | POA: Diagnosis not present

## 2023-05-22 DIAGNOSIS — I5022 Chronic systolic (congestive) heart failure: Secondary | ICD-10-CM | POA: Diagnosis not present

## 2023-05-22 DIAGNOSIS — E785 Hyperlipidemia, unspecified: Secondary | ICD-10-CM | POA: Diagnosis not present

## 2023-05-22 DIAGNOSIS — Z01818 Encounter for other preprocedural examination: Secondary | ICD-10-CM

## 2023-05-22 DIAGNOSIS — Z01812 Encounter for preprocedural laboratory examination: Secondary | ICD-10-CM | POA: Diagnosis not present

## 2023-05-22 DIAGNOSIS — I428 Other cardiomyopathies: Secondary | ICD-10-CM | POA: Insufficient documentation

## 2023-05-22 DIAGNOSIS — I7121 Aneurysm of the ascending aorta, without rupture: Secondary | ICD-10-CM | POA: Insufficient documentation

## 2023-05-22 DIAGNOSIS — I110 Hypertensive heart disease with heart failure: Secondary | ICD-10-CM | POA: Diagnosis not present

## 2023-05-22 HISTORY — DX: Type 2 diabetes mellitus without complications: E11.9

## 2023-05-22 HISTORY — DX: Heart failure, unspecified: I50.9

## 2023-05-22 LAB — BASIC METABOLIC PANEL
Anion gap: 10 (ref 5–15)
BUN: 15 mg/dL (ref 8–23)
CO2: 25 mmol/L (ref 22–32)
Calcium: 9.2 mg/dL (ref 8.9–10.3)
Chloride: 106 mmol/L (ref 98–111)
Creatinine, Ser: 0.91 mg/dL (ref 0.44–1.00)
GFR, Estimated: >60 mL/min (ref >60)
Glucose, Bld: 101 mg/dL — ABNORMAL HIGH (ref 70–99)
Potassium: 3.7 mmol/L (ref 3.5–5.1)
Sodium: 141 mmol/L (ref 135–145)

## 2023-05-22 LAB — CBC
HCT: 39.9 % (ref 36.0–46.0)
Hemoglobin: 12.2 g/dL (ref 12.0–15.0)
MCH: 28.5 pg (ref 26.0–34.0)
MCHC: 30.6 g/dL (ref 30.0–36.0)
MCV: 93.2 fL (ref 80.0–100.0)
Platelets: 186 10*3/uL (ref 150–400)
RBC: 4.28 MIL/uL (ref 3.87–5.11)
RDW: 13.9 % (ref 11.5–15.5)
WBC: 7.1 10*3/uL (ref 4.0–10.5)
nRBC: 0 % (ref 0.0–0.2)

## 2023-05-22 LAB — TYPE AND SCREEN
ABO/RH(D): O POS
Antibody Screen: NEGATIVE

## 2023-05-22 LAB — GLUCOSE, CAPILLARY: Glucose-Capillary: 110 mg/dL — ABNORMAL HIGH (ref 70–99)

## 2023-05-22 LAB — SURGICAL PCR SCREEN
MRSA, PCR: NEGATIVE
Staphylococcus aureus: NEGATIVE

## 2023-05-22 LAB — HEMOGLOBIN A1C
Hgb A1c MFr Bld: 6.2 % — ABNORMAL HIGH (ref 4.8–5.6)
Mean Plasma Glucose: 131.24 mg/dL

## 2023-05-22 NOTE — Telephone Encounter (Signed)
FYI- pt called to let Dr. Carmelia Roller know that she is scheduled to have back surgery on 06/04/23.

## 2023-05-22 NOTE — Progress Notes (Signed)
PCP - Jilda Roche [place]ling Cardiologist - Zoila Shutter   (has not seen since 01-01-23) She did state that someone maybe from surgeon's office was to reach out to cardio for clearance.   PPM/ICD - denies Device Orders - n/a Rep Notified - n/a  Chest x-ray -  EKG - 07-11-22 Stress Test - 2011 ECHO - 10-15-21 Cardiac Cath - 07-2017  Sleep Study - denies CPAP - n/a  Fasting Blood Sugar - Blood sugar 110 at PAT appointment  Checks Blood Sugar- Patient states she does not check  her blood sugar that MD monitors lab work  Blood Thinner Instructions: Denies Aspirin Instructions: Made aware to follow up with surgeon  ERAS Protcol - with drink PRE-SURGERY G2-   COVID TEST- n/a   Anesthesia review: Yes. HTN, DM, hx of heart cath 2018  Patient denies shortness of breath, fever, cough and chest pain at PAT appointment   All instructions explained to the patient, with a verbal understanding of the material. Patient agrees to go over the instructions while at home for a better understanding. Patient also instructed to self quarantine after being tested for COVID-19. The opportunity to ask questions was provided.

## 2023-05-23 NOTE — Anesthesia Preprocedure Evaluation (Addendum)
Anesthesia Evaluation  Patient identified by MRN, date of birth, ID band Patient awake    Reviewed: Allergy & Precautions, H&P , NPO status , Patient's Chart, lab work & pertinent test results  Airway Mallampati: II  TM Distance: >3 FB Neck ROM: Full    Dental no notable dental hx.    Pulmonary neg pulmonary ROS   Pulmonary exam normal breath sounds clear to auscultation       Cardiovascular hypertension, +CHF  Normal cardiovascular exam Rhythm:Regular Rate:Normal  Hx of NICM. EF 25% in 2018  Recovery to 40% in most recent TTE from 2023   Neuro/Psych negative neurological ROS  negative psych ROS   GI/Hepatic negative GI ROS, Neg liver ROS,,,  Endo/Other  diabetes    Renal/GU negative Renal ROS  negative genitourinary   Musculoskeletal  (+) Arthritis ,    Abdominal   Peds negative pediatric ROS (+)  Hematology  (+) Blood dyscrasia, anemia   Anesthesia Other Findings   Reproductive/Obstetrics negative OB ROS                             Anesthesia Physical Anesthesia Plan  ASA: 3  Anesthesia Plan: General   Post-op Pain Management:    Induction: Intravenous  PONV Risk Score and Plan: Ondansetron and Dexamethasone  Airway Management Planned: Oral ETT  Additional Equipment: Arterial line  Intra-op Plan:   Post-operative Plan: Extubation in OR  Informed Consent: I have reviewed the patients History and Physical, chart, labs and discussed the procedure including the risks, benefits and alternatives for the proposed anesthesia with the patient or authorized representative who has indicated his/her understanding and acceptance.     Dental advisory given  Plan Discussed with: CRNA  Anesthesia Plan Comments: (PAT note by Antionette Poles, PA-C: Follows cardiology for history of HFrEF/nonischemic cardiomyopathy, HTN, HLD, ascending aortic aneurysm.  Most recent echo 10/15/2021 showed  EF 40 to 45%, grade 1 DD, normal RV function, moderate AI, mild dilation of ascending aorta 40 mm, no significant change from prior study 10/2020.  Cardiac catheterization 2018 showed normal coronaries.  Last seen by Carlos Levering, NP on 01/21/2023 and noted to be doing well at that time, euvolemic on exam, participating in "senior citizen" exercises 4 days/week.  She was continued on Entresto, carvedilol, spironolactone, Lasix, hydralazine.  No changes to management.  3-month follow-up recommended.  Non-insulin-dependent DM2, A1c 6.2 on preop labs  Labs reviewed, WNL.  EKG 07/11/22: NSR. Rate 80. Possible anterior infarct, age undetermined.   TTE 10/15/2021: 1. Left ventricular ejection fraction, by estimation, is 40 to 45%. The  left ventricle has mildly decreased function. The left ventricle has no  regional wall motion abnormalities. Left ventricular diastolic parameters  are consistent with Grade I  diastolic dysfunction (impaired relaxation).  2. Right ventricular systolic function is normal. The right ventricular  size is normal.  3. The mitral valve is normal in structure. No evidence of mitral valve  regurgitation. No evidence of mitral stenosis.  4. The aortic valve is normal in structure. Aortic valve regurgitation is  moderate. No aortic stenosis is present.  5. Aortic dilatation noted. There is mild dilatation of the ascending  aorta, measuring 40 mm.  6. The inferior vena cava is normal in size with greater than 50%  respiratory variability, suggesting right atrial pressure of 3 mmHg.   Comparison(s): No significant change from prior study. Prior images  reviewed side by side.   )  Anesthesia Quick Evaluation

## 2023-05-23 NOTE — Progress Notes (Signed)
Anesthesia Chart Review:  Follows cardiology for history of HFrEF/nonischemic cardiomyopathy, HTN, HLD, ascending aortic aneurysm.  Most recent echo 10/15/2021 showed EF 40 to 45%, grade 1 DD, normal RV function, moderate AI, mild dilation of ascending aorta 40 mm, no significant change from prior study 10/2020.  Cardiac catheterization 2018 showed normal coronaries.  Last seen by Sharon Levering, NP on 01/21/2023 and noted to be doing well at that time, euvolemic on exam, participating in "senior citizen" exercises 4 days/week.  She was continued on Entresto, carvedilol, spironolactone, Lasix, hydralazine.  No changes to management.  3-month follow-up recommended.  Non-insulin-dependent DM2, A1c 6.2 on preop labs  Labs reviewed, WNL.  EKG 07/11/22: NSR. Rate 80. Possible anterior infarct, age undetermined.   TTE 10/15/2021:  1. Left ventricular ejection fraction, by estimation, is 40 to 45%. The  left ventricle has mildly decreased function. The left ventricle has no  regional wall motion abnormalities. Left ventricular diastolic parameters  are consistent with Grade I  diastolic dysfunction (impaired relaxation).   2. Right ventricular systolic function is normal. The right ventricular  size is normal.   3. The mitral valve is normal in structure. No evidence of mitral valve  regurgitation. No evidence of mitral stenosis.   4. The aortic valve is normal in structure. Aortic valve regurgitation is  moderate. No aortic stenosis is present.   5. Aortic dilatation noted. There is mild dilatation of the ascending  aorta, measuring 40 mm.   6. The inferior vena cava is normal in size with greater than 50%  respiratory variability, suggesting right atrial pressure of 3 mmHg.   Comparison(s): No significant change from prior study. Prior images  reviewed side by side.     Sharon Yu [facility] Short Stay Center/Anesthesiology Phone [phone] 05/23/2023 3:29 PM

## 2023-06-04 ENCOUNTER — Ambulatory Visit (HOSPITAL_COMMUNITY): Payer: Medicare PPO

## 2023-06-04 ENCOUNTER — Other Ambulatory Visit: Payer: Self-pay

## 2023-06-04 ENCOUNTER — Observation Stay (HOSPITAL_COMMUNITY)
Admission: RE | Admit: 2023-06-04 | Discharge: 2023-06-05 | Disposition: A | Discharge status: Home / Self Care | Payer: Medicare PPO | Attending: Orthopedic Surgery | Admitting: Orthopedic Surgery

## 2023-06-04 ENCOUNTER — Ambulatory Visit (HOSPITAL_COMMUNITY)
Admission: RE | Disposition: A | Discharge status: Home / Self Care | Payer: Self-pay | Source: Home / Self Care | Attending: Orthopedic Surgery

## 2023-06-04 ENCOUNTER — Encounter (HOSPITAL_COMMUNITY): Payer: Self-pay | Admitting: Orthopedic Surgery

## 2023-06-04 ENCOUNTER — Ambulatory Visit (HOSPITAL_COMMUNITY): Payer: Medicare PPO | Admitting: Physician Assistant

## 2023-06-04 DIAGNOSIS — M5416 Radiculopathy, lumbar region: Secondary | ICD-10-CM | POA: Diagnosis not present

## 2023-06-04 DIAGNOSIS — Z96653 Presence of artificial knee joint, bilateral: Secondary | ICD-10-CM | POA: Insufficient documentation

## 2023-06-04 DIAGNOSIS — M4316 Spondylolisthesis, lumbar region: Secondary | ICD-10-CM

## 2023-06-04 DIAGNOSIS — I428 Other cardiomyopathies: Secondary | ICD-10-CM | POA: Diagnosis not present

## 2023-06-04 DIAGNOSIS — Z7984 Long term (current) use of oral hypoglycemic drugs: Secondary | ICD-10-CM | POA: Insufficient documentation

## 2023-06-04 DIAGNOSIS — Z981 Arthrodesis status: Secondary | ICD-10-CM | POA: Diagnosis not present

## 2023-06-04 DIAGNOSIS — I110 Hypertensive heart disease with heart failure: Secondary | ICD-10-CM | POA: Insufficient documentation

## 2023-06-04 DIAGNOSIS — Z7982 Long term (current) use of aspirin: Secondary | ICD-10-CM | POA: Diagnosis not present

## 2023-06-04 DIAGNOSIS — I509 Heart failure, unspecified: Secondary | ICD-10-CM | POA: Insufficient documentation

## 2023-06-04 DIAGNOSIS — I5042 Chronic combined systolic (congestive) and diastolic (congestive) heart failure: Secondary | ICD-10-CM | POA: Diagnosis not present

## 2023-06-04 DIAGNOSIS — M48061 Spinal stenosis, lumbar region without neurogenic claudication: Secondary | ICD-10-CM | POA: Diagnosis not present

## 2023-06-04 DIAGNOSIS — E119 Type 2 diabetes mellitus without complications: Secondary | ICD-10-CM | POA: Insufficient documentation

## 2023-06-04 DIAGNOSIS — E785 Hyperlipidemia, unspecified: Secondary | ICD-10-CM | POA: Diagnosis not present

## 2023-06-04 DIAGNOSIS — Z79899 Other long term (current) drug therapy: Secondary | ICD-10-CM | POA: Insufficient documentation

## 2023-06-04 HISTORY — PX: TRANSFORAMINAL LUMBAR INTERBODY FUSION (TLIF) WITH PEDICLE SCREW FIXATION 2 LEVEL: SHX6142

## 2023-06-04 LAB — GLUCOSE, CAPILLARY
Glucose-Capillary: 120 mg/dL — ABNORMAL HIGH (ref 70–99)
Glucose-Capillary: 141 mg/dL — ABNORMAL HIGH (ref 70–99)
Glucose-Capillary: 120 mg/dL — ABNORMAL HIGH (ref 70–99)

## 2023-06-04 SURGERY — TRANSFORAMINAL LUMBAR INTERBODY FUSION (TLIF) WITH PEDICLE SCREW FIXATION 2 LEVEL
Anesthesia: General | Site: Spine Lumbar | Laterality: Right

## 2023-06-04 MED ORDER — POTASSIUM CHLORIDE IN NACL 20-0.9 MEQ/L-% IV SOLN
INTRAVENOUS | Status: DC
  Filled 2023-06-04: qty 1000

## 2023-06-04 MED ORDER — OXYCODONE HCL 5 MG PO TABS: 5.0000 mg | ORAL_TABLET | Freq: Once | ORAL | Status: DC | PRN

## 2023-06-04 MED ORDER — POVIDONE-IODINE 7.5 % EX SOLN
Freq: Once | CUTANEOUS | Status: DC
  Filled 2023-06-04: qty 118

## 2023-06-04 MED ORDER — PHENOL 1.4 % MT LIQD: 1.0000 | OROMUCOSAL | Status: DC | PRN

## 2023-06-04 MED ORDER — SPIRONOLACTONE 25 MG PO TABS
25.0000 mg | ORAL_TABLET | Freq: Every day | ORAL | Status: DC
  Administered 2023-06-04: 25 mg via ORAL
  Filled 2023-06-04 (×2): qty 1

## 2023-06-04 MED ORDER — ROCURONIUM BROMIDE 10 MG/ML (PF) SYRINGE
PREFILLED_SYRINGE | INTRAVENOUS | Status: AC
  Filled 2023-06-04: qty 10

## 2023-06-04 MED ORDER — BUPIVACAINE-EPINEPHRINE (PF) 0.25% -1:200000 IJ SOLN
INTRAMUSCULAR | Status: AC
  Filled 2023-06-04: qty 30

## 2023-06-04 MED ORDER — HYDROCODONE-ACETAMINOPHEN 5-325 MG PO TABS: 1.0000 | ORAL_TABLET | ORAL | Status: DC | PRN

## 2023-06-04 MED ORDER — CHLORHEXIDINE GLUCONATE 0.12 % MT SOLN
OROMUCOSAL | Status: AC
  Administered 2023-06-04: 15 mL via OROMUCOSAL
  Filled 2023-06-04: qty 15

## 2023-06-04 MED ORDER — MINERAL OIL LIGHT OIL
TOPICAL_OIL | Status: DC | PRN
Start: 2023-06-04 — End: 2023-06-04
  Administered 2023-06-04: 1 via TOPICAL

## 2023-06-04 MED ORDER — BUPIVACAINE-EPINEPHRINE (PF) 0.25% -1:200000 IJ SOLN
INTRAMUSCULAR | Status: DC | PRN
  Administered 2023-06-04: 40 mL

## 2023-06-04 MED ORDER — METHOCARBAMOL 500 MG PO TABS
500.0000 mg | ORAL_TABLET | Freq: Four times a day (QID) | ORAL | Status: DC | PRN
  Administered 2023-06-04 – 2023-06-05 (×2): 500 mg via ORAL
  Filled 2023-06-04 (×2): qty 1

## 2023-06-04 MED ORDER — PHENYLEPHRINE HCL-NACL 20-0.9 MG/250ML-% IV SOLN
INTRAVENOUS | Status: DC | PRN
Start: 2023-06-04 — End: 2023-06-04

## 2023-06-04 MED ORDER — PHENYLEPHRINE 80 MCG/ML (10ML) SYRINGE FOR IV PUSH (FOR BLOOD PRESSURE SUPPORT)
PREFILLED_SYRINGE | INTRAVENOUS | Status: DC | PRN
  Administered 2023-06-04: 80 ug via INTRAVENOUS
  Administered 2023-06-04 (×3): 160 ug via INTRAVENOUS

## 2023-06-04 MED ORDER — LACTATED RINGERS IV SOLN: INTRAVENOUS | Status: DC

## 2023-06-04 MED ORDER — BUPIVACAINE-EPINEPHRINE 0.25% -1:200000 IJ SOLN
INTRAMUSCULAR | Status: DC | PRN
  Administered 2023-06-04: 10 mL

## 2023-06-04 MED ORDER — FENTANYL CITRATE (PF) 100 MCG/2ML IJ SOLN
INTRAMUSCULAR | Status: AC
  Filled 2023-06-04: qty 2

## 2023-06-04 MED ORDER — ACETAMINOPHEN 650 MG RE SUPP: 650.0000 mg | RECTAL | Status: DC | PRN

## 2023-06-04 MED ORDER — LIDOCAINE 2% (20 MG/ML) 5 ML SYRINGE
INTRAMUSCULAR | Status: AC
  Filled 2023-06-04: qty 5

## 2023-06-04 MED ORDER — ALLOPURINOL 100 MG PO TABS
100.0000 mg | ORAL_TABLET | Freq: Every day | ORAL | Status: DC
  Administered 2023-06-05: 100 mg via ORAL
  Filled 2023-06-04: qty 1

## 2023-06-04 MED ORDER — VITAMIN E 45 MG (100 UNIT) PO CAPS: 400.0000 [IU] | ORAL_CAPSULE | Freq: Every day | ORAL | Status: DC

## 2023-06-04 MED ORDER — CYCLOSPORINE 0.05 % OP EMUL: 1.0000 [drp] | OPHTHALMIC | Status: DC

## 2023-06-04 MED ORDER — CARVEDILOL 25 MG PO TABS
25.0000 mg | ORAL_TABLET | Freq: Two times a day (BID) | ORAL | Status: DC
  Administered 2023-06-04 – 2023-06-05 (×2): 25 mg via ORAL
  Filled 2023-06-04 (×2): qty 1

## 2023-06-04 MED ORDER — METHOCARBAMOL 1000 MG/10ML IJ SOLN: 500.0000 mg | Freq: Four times a day (QID) | INTRAVENOUS | Status: DC | PRN

## 2023-06-04 MED ORDER — DOCUSATE SODIUM 100 MG PO CAPS
100.0000 mg | ORAL_CAPSULE | Freq: Two times a day (BID) | ORAL | Status: DC
  Administered 2023-06-04 – 2023-06-05 (×2): 100 mg via ORAL
  Filled 2023-06-04 (×2): qty 1

## 2023-06-04 MED ORDER — CEFAZOLIN SODIUM 1 G IJ SOLR
INTRAMUSCULAR | Status: AC
  Filled 2023-06-04: qty 20

## 2023-06-04 MED ORDER — VITAMIN C 500 MG PO TABS
500.0000 mg | ORAL_TABLET | Freq: Every day | ORAL | Status: DC
  Administered 2023-06-05: 500 mg via ORAL
  Filled 2023-06-04: qty 1

## 2023-06-04 MED ORDER — ALENDRONATE SODIUM 70 MG PO TABS: 70.0000 mg | ORAL_TABLET | ORAL | Status: DC

## 2023-06-04 MED ORDER — BUPIVACAINE LIPOSOME 1.3 % IJ SUSP
INTRAMUSCULAR | Status: AC
  Filled 2023-06-04: qty 20

## 2023-06-04 MED ORDER — CHLORHEXIDINE GLUCONATE 0.12 % MT SOLN: 15.0000 mL | Freq: Once | OROMUCOSAL | Status: AC

## 2023-06-04 MED ORDER — THROMBIN 20000 UNITS EX SOLR
CUTANEOUS | Status: AC
  Filled 2023-06-04: qty 20000

## 2023-06-04 MED ORDER — METFORMIN HCL 500 MG PO TABS
500.0000 mg | ORAL_TABLET | Freq: Every day | ORAL | Status: DC
  Administered 2023-06-05: 500 mg via ORAL
  Filled 2023-06-04: qty 1

## 2023-06-04 MED ORDER — OXYCODONE-ACETAMINOPHEN 5-325 MG PO TABS
1.0000 | ORAL_TABLET | ORAL | Status: DC | PRN
  Administered 2023-06-04 – 2023-06-05 (×5): 2 via ORAL
  Filled 2023-06-04 (×5): qty 2

## 2023-06-04 MED ORDER — OXYCODONE HCL 5 MG/5ML PO SOLN: 5.0000 mg | Freq: Once | ORAL | Status: DC | PRN

## 2023-06-04 MED ORDER — PHENYLEPHRINE 80 MCG/ML (10ML) SYRINGE FOR IV PUSH (FOR BLOOD PRESSURE SUPPORT)
PREFILLED_SYRINGE | INTRAVENOUS | Status: AC
  Filled 2023-06-04: qty 10

## 2023-06-04 MED ORDER — ADULT MULTIVITAMIN W/MINERALS CH
1.0000 | ORAL_TABLET | Freq: Every day | ORAL | Status: DC
  Administered 2023-06-05: 1 via ORAL
  Filled 2023-06-04: qty 1

## 2023-06-04 MED ORDER — FENTANYL CITRATE (PF) 100 MCG/2ML IJ SOLN
25.0000 ug | INTRAMUSCULAR | Status: DC | PRN
  Administered 2023-06-04 (×2): 50 ug via INTRAVENOUS

## 2023-06-04 MED ORDER — FLEET ENEMA RE ENEM: 1.0000 | ENEMA | Freq: Once | RECTAL | Status: DC | PRN

## 2023-06-04 MED ORDER — ONDANSETRON HCL 4 MG/2ML IJ SOLN
INTRAMUSCULAR | Status: AC
  Filled 2023-06-04: qty 2

## 2023-06-04 MED ORDER — ACETAMINOPHEN 325 MG PO TABS: 650.0000 mg | ORAL_TABLET | ORAL | Status: DC | PRN

## 2023-06-04 MED ORDER — MORPHINE SULFATE (PF) 2 MG/ML IV SOLN: 1.0000 mg | INTRAVENOUS | Status: DC | PRN

## 2023-06-04 MED ORDER — VITAMIN D 25 MCG (1000 UNIT) PO TABS
1000.0000 [IU] | ORAL_TABLET | Freq: Every day | ORAL | Status: DC
  Administered 2023-06-05: 1000 [IU] via ORAL
  Filled 2023-06-04: qty 1

## 2023-06-04 MED ORDER — BISACODYL 5 MG PO TBEC: 5.0000 mg | DELAYED_RELEASE_TABLET | Freq: Every day | ORAL | Status: DC | PRN

## 2023-06-04 MED ORDER — POTASSIUM GLUCONATE 595 (99 K) MG PO TABS: 595.0000 mg | ORAL_TABLET | Freq: Every day | ORAL | Status: DC

## 2023-06-04 MED ORDER — MIDAZOLAM HCL 2 MG/2ML IJ SOLN
INTRAMUSCULAR | Status: AC
  Filled 2023-06-04: qty 2

## 2023-06-04 MED ORDER — SODIUM CHLORIDE 0.9% FLUSH
3.0000 mL | Freq: Two times a day (BID) | INTRAVENOUS | Status: DC
  Administered 2023-06-04: 3 mL via INTRAVENOUS

## 2023-06-04 MED ORDER — FUROSEMIDE 40 MG PO TABS: 40.0000 mg | ORAL_TABLET | Freq: Every day | ORAL | Status: DC

## 2023-06-04 MED ORDER — FENTANYL CITRATE (PF) 250 MCG/5ML IJ SOLN
INTRAMUSCULAR | Status: DC | PRN
  Administered 2023-06-04: 50 ug via INTRAVENOUS
  Administered 2023-06-04: 25 ug via INTRAVENOUS
  Administered 2023-06-04: 100 ug via INTRAVENOUS
  Administered 2023-06-04: 25 ug via INTRAVENOUS

## 2023-06-04 MED ORDER — PROPOFOL 10 MG/ML IV BOLUS
INTRAVENOUS | Status: DC | PRN
  Administered 2023-06-04: 30 mg via INTRAVENOUS
  Administered 2023-06-04: 170 mg via INTRAVENOUS

## 2023-06-04 MED ORDER — 0.9 % SODIUM CHLORIDE (POUR BTL) OPTIME
TOPICAL | Status: DC | PRN
  Administered 2023-06-04: 1000 mL

## 2023-06-04 MED ORDER — ATORVASTATIN CALCIUM 40 MG PO TABS
40.0000 mg | ORAL_TABLET | Freq: Every day | ORAL | Status: DC
  Administered 2023-06-04: 40 mg via ORAL
  Filled 2023-06-04 (×2): qty 1

## 2023-06-04 MED ORDER — ZOLPIDEM TARTRATE 5 MG PO TABS: 5.0000 mg | ORAL_TABLET | Freq: Every evening | ORAL | Status: DC | PRN

## 2023-06-04 MED ORDER — ALUM & MAG HYDROXIDE-SIMETH 200-200-20 MG/5ML PO SUSP: 30.0000 mL | Freq: Four times a day (QID) | ORAL | Status: DC | PRN

## 2023-06-04 MED ORDER — DROPERIDOL 2.5 MG/ML IJ SOLN: 0.6250 mg | Freq: Once | INTRAMUSCULAR | Status: DC | PRN

## 2023-06-04 MED ORDER — SODIUM CHLORIDE 0.9 % IV SOLN: 250.0000 mL | INTRAVENOUS | Status: DC

## 2023-06-04 MED ORDER — ONDANSETRON HCL 4 MG/2ML IJ SOLN: 4.0000 mg | Freq: Four times a day (QID) | INTRAMUSCULAR | Status: DC | PRN

## 2023-06-04 MED ORDER — ALBUMIN HUMAN 5 % IV SOLN: INTRAVENOUS | Status: DC | PRN

## 2023-06-04 MED ORDER — LIDOCAINE 2% (20 MG/ML) 5 ML SYRINGE
INTRAMUSCULAR | Status: DC | PRN
  Administered 2023-06-04: 100 mg via INTRAVENOUS

## 2023-06-04 MED ORDER — SENNOSIDES-DOCUSATE SODIUM 8.6-50 MG PO TABS
1.0000 | ORAL_TABLET | Freq: Every evening | ORAL | Status: DC | PRN
  Administered 2023-06-04: 1 via ORAL
  Filled 2023-06-04: qty 1

## 2023-06-04 MED ORDER — FENTANYL CITRATE (PF) 250 MCG/5ML IJ SOLN
INTRAMUSCULAR | Status: AC
  Filled 2023-06-04: qty 5

## 2023-06-04 MED ORDER — ONDANSETRON HCL 4 MG PO TABS: 4.0000 mg | ORAL_TABLET | Freq: Four times a day (QID) | ORAL | Status: DC | PRN

## 2023-06-04 MED ORDER — CEFAZOLIN SODIUM-DEXTROSE 2-4 GM/100ML-% IV SOLN
INTRAVENOUS | Status: AC
  Filled 2023-06-04: qty 100

## 2023-06-04 MED ORDER — METHYLENE BLUE (ANTIDOTE) 1 % IV SOLN
INTRAVENOUS | Status: AC
  Filled 2023-06-04: qty 10

## 2023-06-04 MED ORDER — SACUBITRIL-VALSARTAN 97-103 MG PO TABS
1.0000 | ORAL_TABLET | Freq: Two times a day (BID) | ORAL | Status: DC
  Administered 2023-06-04 – 2023-06-05 (×2): 1 via ORAL
  Filled 2023-06-04 (×3): qty 1

## 2023-06-04 MED ORDER — DEXAMETHASONE SODIUM PHOSPHATE 10 MG/ML IJ SOLN
INTRAMUSCULAR | Status: AC
  Filled 2023-06-04: qty 1

## 2023-06-04 MED ORDER — ACETAMINOPHEN 10 MG/ML IV SOLN: 1000.0000 mg | Freq: Once | INTRAVENOUS | Status: DC | PRN

## 2023-06-04 MED ORDER — ROCURONIUM BROMIDE 10 MG/ML (PF) SYRINGE
PREFILLED_SYRINGE | INTRAVENOUS | Status: DC | PRN
  Administered 2023-06-04: 20 mg via INTRAVENOUS
  Administered 2023-06-04: 70 mg via INTRAVENOUS
  Administered 2023-06-04 (×2): 20 mg via INTRAVENOUS
  Administered 2023-06-04: 30 mg via INTRAVENOUS

## 2023-06-04 MED ORDER — HYDRALAZINE HCL 25 MG PO TABS
25.0000 mg | ORAL_TABLET | Freq: Two times a day (BID) | ORAL | Status: DC
  Administered 2023-06-04 – 2023-06-05 (×2): 25 mg via ORAL
  Filled 2023-06-04 (×2): qty 1

## 2023-06-04 MED ORDER — ORAL CARE MOUTH RINSE: 15.0000 mL | Freq: Once | OROMUCOSAL | Status: AC

## 2023-06-04 MED ORDER — PROPOFOL 10 MG/ML IV BOLUS
INTRAVENOUS | Status: AC
  Filled 2023-06-04: qty 20

## 2023-06-04 MED ORDER — PHENYLEPHRINE HCL-NACL 20-0.9 MG/250ML-% IV SOLN
INTRAVENOUS | Status: DC | PRN
Start: 2023-06-04 — End: 2023-06-04
  Administered 2023-06-04: 40 ug/min via INTRAVENOUS

## 2023-06-04 MED ORDER — MIDAZOLAM HCL 2 MG/2ML IJ SOLN
INTRAMUSCULAR | Status: DC | PRN
  Administered 2023-06-04 (×2): 1 mg via INTRAVENOUS

## 2023-06-04 MED ORDER — CEFAZOLIN SODIUM-DEXTROSE 2-4 GM/100ML-% IV SOLN
2.0000 g | Freq: Three times a day (TID) | INTRAVENOUS | Status: AC
  Administered 2023-06-04 (×2): 2 g via INTRAVENOUS
  Filled 2023-06-04 (×2): qty 100

## 2023-06-04 MED ORDER — ONDANSETRON HCL 4 MG/2ML IJ SOLN
INTRAMUSCULAR | Status: DC | PRN
  Administered 2023-06-04: 4 mg via INTRAVENOUS

## 2023-06-04 MED ORDER — SUGAMMADEX SODIUM 200 MG/2ML IV SOLN
INTRAVENOUS | Status: DC | PRN
  Administered 2023-06-04: 200 mg via INTRAVENOUS

## 2023-06-04 MED ORDER — SODIUM CHLORIDE 0.9% FLUSH: 3.0000 mL | INTRAVENOUS | Status: DC | PRN

## 2023-06-04 MED ORDER — CEFAZOLIN SODIUM-DEXTROSE 2-4 GM/100ML-% IV SOLN
2.0000 g | INTRAVENOUS | Status: AC
  Administered 2023-06-04 (×2): 2 g via INTRAVENOUS

## 2023-06-04 MED ORDER — THROMBIN 20000 UNITS EX KIT
PACK | CUTANEOUS | Status: DC | PRN
  Administered 2023-06-04: 20 mL via TOPICAL

## 2023-06-04 MED ORDER — MENTHOL 3 MG MT LOZG
1.0000 | LOZENGE | OROMUCOSAL | Status: DC | PRN
  Filled 2023-06-04: qty 9

## 2023-06-04 MED ORDER — FLUTICASONE PROPIONATE 50 MCG/ACT NA SUSP: 2.0000 | Freq: Every day | NASAL | Status: DC | PRN

## 2023-06-04 MED ORDER — DEXAMETHASONE SODIUM PHOSPHATE 10 MG/ML IJ SOLN
INTRAMUSCULAR | Status: DC | PRN
  Administered 2023-06-04: 5 mg via INTRAVENOUS

## 2023-06-04 SURGICAL SUPPLY — 91 items
AGENT HMST KT MTR STRL THRMB (HEMOSTASIS)
APL SKNCLS STERI-STRIP NONHPOA (GAUZE/BANDAGES/DRESSINGS) ×1
BAG COUNTER SPONGE SURGICOUNT (BAG) ×1 IMPLANT
BAG SPNG CNTER NS LX DISP (BAG) ×1
BENZOIN TINCTURE PRP APPL 2/3 (GAUZE/BANDAGES/DRESSINGS) ×1 IMPLANT
BLADE CLIPPER SURG (BLADE) IMPLANT
BUR PRESCISION 1.7 ELITE (BURR) ×1 IMPLANT
BUR ROUND FLUTED 5 RND (BURR) ×1 IMPLANT
BUR ROUND PRECISION 4.0 (BURR) IMPLANT
BUR SABER RD CUTTING 3.0 (BURR) IMPLANT
CAGE SABLE 10X22 6-12 8D (Cage) IMPLANT
CAGE SABLE 10X26 6-12 8D (Cage) IMPLANT
CANNULA GRAFT BNE VG PRE-FILL (Bone Implant) IMPLANT
CNTNR URN SCR LID CUP LEK RST (MISCELLANEOUS) ×1 IMPLANT
CONT SPEC 4OZ STRL OR WHT (MISCELLANEOUS) ×1
COVER MAYO STAND STRL (DRAPES) ×2 IMPLANT
COVER SURGICAL LIGHT HANDLE (MISCELLANEOUS) ×1 IMPLANT
DISPENSER GRAFT BNE VG (MISCELLANEOUS) IMPLANT
DISPENSER VIVIGEN BONE GRAFT (MISCELLANEOUS) ×1
DRAIN CHANNEL 15F RND FF W/TCR (WOUND CARE) IMPLANT
DRAPE C-ARM 42X72 X-RAY (DRAPES) ×1 IMPLANT
DRAPE C-ARMOR (DRAPES) IMPLANT
DRAPE POUCH INSTRU U-SHP 10X18 (DRAPES) ×1 IMPLANT
DRAPE SURG 17X23 STRL (DRAPES) ×4 IMPLANT
DURAPREP 26ML APPLICATOR (WOUND CARE) ×1 IMPLANT
ELECT BLADE 4.0 EZ CLEAN MEGAD (MISCELLANEOUS) ×1
ELECT CAUTERY BLADE 6.4 (BLADE) ×1 IMPLANT
ELECT PENCIL ROCKER SW 15FT (MISCELLANEOUS) IMPLANT
ELECT REM PT RETURN 9FT ADLT (ELECTROSURGICAL) ×1
ELECTRODE BLDE 4.0 EZ CLN MEGD (MISCELLANEOUS) ×1 IMPLANT
ELECTRODE REM PT RTRN 9FT ADLT (ELECTROSURGICAL) ×1 IMPLANT
EVACUATOR SILICONE 100CC (DRAIN) IMPLANT
FILTER STRAW FLUID ASPIR (MISCELLANEOUS) ×1 IMPLANT
GAUZE 4X4 16PLY ~~LOC~~+RFID DBL (SPONGE) ×1 IMPLANT
GAUZE SPONGE 4X4 12PLY STRL (GAUZE/BANDAGES/DRESSINGS) ×1 IMPLANT
GLOVE BIO SURGEON STRL SZ 6.5 (GLOVE) ×1 IMPLANT
GLOVE BIO SURGEON STRL SZ8 (GLOVE) ×1 IMPLANT
GLOVE BIOGEL PI IND STRL 7.0 (GLOVE) ×1 IMPLANT
GLOVE BIOGEL PI IND STRL 8 (GLOVE) ×1 IMPLANT
GLOVE SURG ENC MOIS LTX SZ6.5 (GLOVE) ×1 IMPLANT
GOWN STRL REUS W/ TWL LRG LVL3 (GOWN DISPOSABLE) ×2 IMPLANT
GOWN STRL REUS W/ TWL XL LVL3 (GOWN DISPOSABLE) ×1 IMPLANT
GOWN STRL REUS W/TWL LRG LVL3 (GOWN DISPOSABLE) ×2
GOWN STRL REUS W/TWL XL LVL3 (GOWN DISPOSABLE) ×1
GRAFT BONE CANNULA VIVIGEN 3 (Bone Implant) ×4 IMPLANT
IV CATH 14GX2 1/4 (CATHETERS) ×1 IMPLANT
KIT BASIN OR (CUSTOM PROCEDURE TRAY) ×1 IMPLANT
KIT POSITION SURG JACKSON T1 (MISCELLANEOUS) ×1 IMPLANT
KIT TURNOVER KIT B (KITS) ×1 IMPLANT
MARKER SKIN DUAL TIP RULER LAB (MISCELLANEOUS) ×2 IMPLANT
NDL 18GX1X1/2 (RX/OR ONLY) (NEEDLE) ×1 IMPLANT
NDL 22X1.5 STRL (OR ONLY) (MISCELLANEOUS) ×2 IMPLANT
NDL HYPO 25GX1X1/2 BEV (NEEDLE) ×1 IMPLANT
NDL SPNL 18GX3.5 QUINCKE PK (NEEDLE) ×2 IMPLANT
NEEDLE 18GX1X1/2 (RX/OR ONLY) (NEEDLE) ×1
NEEDLE 22X1.5 STRL (OR ONLY) (MISCELLANEOUS) ×2
NEEDLE HYPO 25GX1X1/2 BEV (NEEDLE) ×1
NEEDLE SPNL 18GX3.5 QUINCKE PK (NEEDLE) ×2
NS IRRIG 1000ML POUR BTL (IV SOLUTION) ×1 IMPLANT
PACK LAMINECTOMY ORTHO (CUSTOM PROCEDURE TRAY) ×1 IMPLANT
PACK UNIVERSAL I (CUSTOM PROCEDURE TRAY) ×1 IMPLANT
PAD ARMBOARD 7.5X6 YLW CONV (MISCELLANEOUS) ×2 IMPLANT
PATTIES SURGICAL .5 X1 (DISPOSABLE) ×1 IMPLANT
PATTIES SURGICAL .5X1.5 (GAUZE/BANDAGES/DRESSINGS) ×1 IMPLANT
PUTTY DBX 5CC (Putty) IMPLANT
ROD SPINAL EXP 5.5X60 (Rod) IMPLANT
SCREW SET SINGLE INNER (Screw) IMPLANT
SCREW VIPER CORT FIX 6.00X30 (Screw) IMPLANT
SPONGE INTESTINAL PEANUT (DISPOSABLE) ×1 IMPLANT
SPONGE SURGIFOAM ABS GEL 100 (HEMOSTASIS) ×1 IMPLANT
SPONGE T-LAP 4X18 ~~LOC~~+RFID (SPONGE) IMPLANT
STRIP CLOSURE SKIN 1/2X4 (GAUZE/BANDAGES/DRESSINGS) ×2 IMPLANT
SURGIFLO W/THROMBIN 8M KIT (HEMOSTASIS) IMPLANT
SUT ETHILON 2 0 FS 18 (SUTURE) IMPLANT
SUT MNCRL AB 4-0 PS2 18 (SUTURE) ×1 IMPLANT
SUT VIC AB 0 CT1 18XCR BRD 8 (SUTURE) ×1 IMPLANT
SUT VIC AB 0 CT1 8-18 (SUTURE) ×1
SUT VIC AB 1 CT1 18XCR BRD 8 (SUTURE) ×1 IMPLANT
SUT VIC AB 1 CT1 8-18 (SUTURE) ×1
SUT VIC AB 2-0 CT2 18 VCP726D (SUTURE) ×1 IMPLANT
SYR 20ML LL LF (SYRINGE) ×2 IMPLANT
SYR BULB IRRIG 60ML STRL (SYRINGE) ×1 IMPLANT
SYR CONTROL 10ML LL (SYRINGE) ×2 IMPLANT
SYR TB 1ML LUER SLIP (SYRINGE) ×1 IMPLANT
TAP EXPEDIUM DL 4.35 (INSTRUMENTS) IMPLANT
TAP EXPEDIUM DL 5.0 (INSTRUMENTS) IMPLANT
TAP EXPEDIUM DL 6.0 (INSTRUMENTS) IMPLANT
TRAY FOLEY MTR SLVR 16FR STAT (SET/KITS/TRAYS/PACK) ×1 IMPLANT
TUBE FUNNEL GL DISP (ORTHOPEDIC DISPOSABLE SUPPLIES) IMPLANT
WATER STERILE IRR 1000ML POUR (IV SOLUTION) ×1 IMPLANT
YANKAUER SUCT BULB TIP NO VENT (SUCTIONS) ×1 IMPLANT

## 2023-06-04 NOTE — Anesthesia Postprocedure Evaluation (Signed)
Anesthesia Post Note  Patient: Sharon Yu  Procedure(s) Performed: RIGHT-SIDED LUMBAR THREE- LUMBAR FOUR, LUMBAR FOUR- LUMBAR FIVE TRANSFORAMINAL LUMBAR INTERBODY FUSION AND DECOMPRESSION WITH INSTRUMENTATION AND ALLOGRAFT (Right: Spine Lumbar)     Patient location during evaluation: PACU Anesthesia Type: General Level of consciousness: awake and alert Pain management: pain level controlled Vital Signs Assessment: post-procedure vital signs reviewed and stable Respiratory status: spontaneous breathing, nonlabored ventilation, respiratory function stable and patient connected to nasal cannula oxygen Cardiovascular status: blood pressure returned to baseline and stable Postop Assessment: no apparent nausea or vomiting Anesthetic complications: no   No notable events documented.  Last Vitals:  Vitals:   06/04/23 1600 06/04/23 1627  BP: (!) 161/85 (!) 169/97  Pulse: 85 85  Resp: 18 20  Temp: 36.6 C 36.5 C  SpO2: 97% 100%    Last Pain:  Vitals:   06/04/23 1545  TempSrc:   PainSc: 3                  [place]

## 2023-06-04 NOTE — Anesthesia Procedure Notes (Signed)
Procedure Name: Intubation Date/Time: 06/04/2023 9:03 AM  Performed by: Shary Decamp, CRNAPre-anesthesia Checklist: Patient identified, Patient being monitored, Timeout performed, Emergency Drugs available and Suction available Patient Re-evaluated:Patient Re-evaluated prior to induction Oxygen Delivery Method: Circle System Utilized Preoxygenation: Pre-oxygenation with 100% oxygen Induction Type: IV induction Ventilation: Mask ventilation without difficulty Laryngoscope Size: Miller and 2 Grade View: Grade II Tube type: Oral Tube size: 7.0 mm Number of attempts: 1 Airway Equipment and Method: Stylet Placement Confirmation: ETT inserted through vocal cords under direct vision, positive ETCO2 and breath sounds checked- equal and bilateral Secured at: 21 cm Tube secured with: Tape Dental Injury: Teeth and Oropharynx as per pre-operative assessment

## 2023-06-04 NOTE — Anesthesia Procedure Notes (Signed)
Arterial Line Insertion Start/End10/10/2022 7:28 AM, 06/04/2023 7:35 AM Performed by: Shary Decamp, CRNA, anesthesiologist  Patient location: Pre-op. Preanesthetic checklist: patient identified, IV checked, site marked, risks and benefits discussed, surgical consent, monitors and equipment checked, pre-op evaluation, timeout performed and anesthesia consent Lidocaine 1% used for infiltration Left, radial was placed Catheter size: 20 G Hand hygiene performed  and maximum sterile barriers used   Attempts: 1 Procedure performed using ultrasound guided technique. Following insertion, dressing applied and Biopatch. Post procedure assessment: normal and unchanged  Patient tolerated the procedure well with no immediate complications.

## 2023-06-04 NOTE — Op Note (Signed)
PATIENT NAME: Sharon Yu   MEDICAL RECORD NO.:   [id number]    DATE OF BIRTH: 1951-04-27    DATE OF PROCEDURE: 06/04/2023                                 OPERATIVE REPORT     PREOPERATIVE DIAGNOSES: 1. Severe spinal stenosis L3-4, L4-5 (M48.062) 2. Bilateral lumbar radiculopathy (M54.16) 3. L3-4, L4-5 spondylolisthesis (M53.2X6)   POSTOPERATIVE DIAGNOSES: 1. Severe spinal stenosis L3-4, L4-5 (M48.062) 2. Bilateral lumbar radiculopathy (M54.16) 3. L3-4, L4-5 spondylolisthesis (M53.2X6)   PROCEDURES: 1. Lumbar decompression, L3-4, L4-5, including bilateral partial facetectomy, and bilateral lumbar decompression 2. Right-sided L3-4, L4-5 transforaminal lumbar interbody fusion. 3. Left-sided L3-4, L4-5 posterolateral fusion. 4. Insertion of interbody device x 2 (Globus expandable intervertebral spacers). 5. Placement of segmental posterior instrumentation L3, L4, L5, bilaterally. 6. Use of local autograft. 7. Use of morselized allograft - ViviGen. 8. Intraoperative use of fluoroscopy.   SURGEON:  Estill Bamberg, MD.   ASSISTANTJason Coop, PA-C.   ANESTHESIA:  General endotracheal anesthesia.   COMPLICATIONS:  None.   DISPOSITION:  Stable.   ESTIMATED BLOOD LOSS:  200cc   INDICATIONS FOR SURGERY:  Briefly, [name] is a pleasant 72 y.o. -year-old female who did present to me with severe and ongoing pain in the bilateral legs. I did feel that the symptoms were secondary to the findings noted above. The patient failed conservative care and did wish to proceed with the procedure  noted above.    OPERATIVE DETAILS:  On 06/04/2023, the patient was brought to surgery and general endotracheal anesthesia was administered.  The patient was placed prone on a well-padded flat Jackson bed with a spinal frame.  Antibiotics were given and a time-out procedure was performed. The back was prepped and draped in the usual fashion.  A midline incision was made overlying the  L3-4 and L4-5 intervertebral spaces.  The fascia was incised at the midline.  The paraspinal musculature was bluntly swept laterally.  Anatomic landmarks for the pedicles were exposed. Using fluoroscopy, I did cannulate the L3, L4, and L5 pedicles bilaterally, using a medial to lateral cortical trajectory technique.  On the left side, the posterolateral gutter and facet joints at L3-4 and L4-5 were decorticated and 6 mm screws of the appropriate length were placed at L3, L4, and L5 pedicles and a 60-mm rod was placed and distraction was applied across the rod at each intervertebral level.  On the right side, the cannulated pedicle holes were filled with bone wax.  I then proceeded with the decompressive aspect of the procedure.     Starting at L4-5, I did perform a laminotomy and a full facetectomy on the right.  I was able to thoroughly and entirely decompress the L4-5 intervertebral space bilaterally, removing facet hypertrophy and ligamentum flavum hypertrophy.  At this point, with an assistant holding medial retraction of the traversing right L5 nerve, I did perform a thorough and complete L4-5 intervertebral discectomy.  The intervertebral space was then liberally packed with autograft from the decompression, as well as allograft in the form of ViviGen, as was the appropriately sized intervertebral spacer.  The spacer was then expanded to 10 mm in height. Distraction was then released on the contralateral left side.  I then turned my attention to the L3-4 level.  Once again, it was clearly evident that there was stenosis at the L3-4 level.  The stenosis was thoroughly and adequately decompressed by performing a bilateral partial facetectomy.  I was able to thoroughly decompress both the right L3 and right L4 nerves. With an assistant holding medial retraction of the traversing right L4 nerve, I did perform an annulotomy at the posterolateral aspect of the L3-4 intervertebral space.  I then used  a series of curettes and pituitary rongeurs to perform a thorough and complete intervertebral diskectomy.  The intervertebral space was then liberally packed with autograft as well as allograft in the form of ViviGen, as was the appropriate-sized intervertebral spacer.  The spacer was then tamped into position in the usual fashion, and expanded to 10.4 mm in height.  I was very pleased with the press-fit of the spacer.  I then placed 6 mm screws on the right at L3, L4, and L5.  A 60-mm rod was then placed and caps were placed. The distraction was then released on the contralateral left side.  All 6 caps were then locked.  The wound was copiously irrigated with a total of approximately 3 L prior to placing the bone graft.  Additional autograft and allograft were then packed into the posterolateral gutter on the right side to help aid in the success of the fusion.  The wound was  explored for any undue bleeding and there was no substantial bleeding encountered.  Gel-Foam was placed over the laminectomy site.  The wound was then closed in layers using #1 Vicryl followed by 2-0 Vicryl, followed by 4-0 Monocryl.  Benzoin and Steri-Strips were applied followed by sterile dressing.    Of note, a #15 deep Blake drain was placed deep to the fascia prior to closure.     Of note, Jason Coop was my assistant throughout surgery, and did aid in retraction, suctioning, and closure.     Estill Bamberg, MD

## 2023-06-04 NOTE — Transfer of Care (Signed)
Immediate Anesthesia Transfer of Care Note  Patient: Sharon Yu  Procedure(s) Performed: RIGHT-SIDED LUMBAR THREE- LUMBAR FOUR, LUMBAR FOUR- LUMBAR FIVE TRANSFORAMINAL LUMBAR INTERBODY FUSION AND DECOMPRESSION WITH INSTRUMENTATION AND ALLOGRAFT (Right: Spine Lumbar)  Patient Location: PACU  Anesthesia Type:General  Level of Consciousness: awake and alert   Airway & Oxygen Therapy: Patient Spontanous Breathing  Post-op Assessment: Report given to RN and Post -op Vital signs reviewed and stable  Post vital signs: Reviewed and stable  Last Vitals:  Vitals Value Taken Time  BP 169/97 06/04/23 1627  Temp 36.5 C 06/04/23 1627  Pulse 85 06/04/23 1627  Resp 20 06/04/23 1627  SpO2 100 % 06/04/23 1627    Last Pain:  Vitals:   06/04/23 1545  TempSrc:   PainSc: 3       Patients Stated Pain Goal: 3 (06/04/23 1500)  Complications: No notable events documented.

## 2023-06-04 NOTE — H&P (Signed)
PREOPERATIVE H&P  Chief Complaint: Bilateral leg pain  HPI: Sharon Yu is a 72 y.o. female who presents with ongoing pain in the bilateral legs  MRI reveals stenosis and instability at L3/4 and L4/5  Patient has failed multiple forms of conservative care and continues to have pain (see office notes for additional details regarding the patient's full course of treatment)  Past Medical History:  Diagnosis Date   Arthritis    Back pain    buldging disc   CHF (congestive heart failure) (HCC)    Chronic back pain    Diabetes mellitus without complication (HCC)    Hyperlipidemia    takes Crestor daily   Hypertension    takes Maxzide and Metoprolol daily along with Ramipril   Hypopotassemia    takes Potassium pill daily   Joint pain    Joint swelling    Nonischemic cardiomyopathy (HCC)    a. 07/2017: EF reduced to 25-30%, cath showing normal cors.    Shortness of breath    with exertion   Past Surgical History:  Procedure Laterality Date   bilateral knee arthroscopies     carpel tunnel     bilateral   CHOLECYSTECTOMY     colonscopy     JOINT REPLACEMENT Left    RIGHT/LEFT HEART CATH AND CORONARY ANGIOGRAPHY N/A 07/17/2017   Procedure: RIGHT/LEFT HEART CATH AND CORONARY ANGIOGRAPHY;  Surgeon: [place], Peter M, MD;  [facility] INVASIVE CV LAB;  Service: Cardiovascular;  Laterality: N/A;   TOTAL KNEE ARTHROPLASTY Right 02/19/2013   Procedure: TOTAL KNEE ARTHROPLASTY;  Surgeon: Thera Flake., MD;  Location: [facility] OR;  Service: Orthopedics;  Laterality: Right;  right total knee arthroplasty   TOTAL KNEE REVISION Left 07/02/2013   Procedure: LEFT TOTAL KNEE TIBIA REVISION;  Surgeon: Nestor Lewandowsky, MD;  Location: [facility] OR;  Service: Orthopedics;  Laterality: Left;   Social History   Socioeconomic History   Marital status: Widowed    Spouse name: Not on file   Number of children: Not on file   Years of education: Not on file   Highest education level: Not on file   Occupational History   Occupation: retired  Tobacco Use   Smoking status: Never   Smokeless tobacco: Never  Substance and Sexual Activity   Alcohol use: No   Drug use: No   Sexual activity: Yes    Birth control/protection: Post-menopausal  Other Topics Concern   Not on file  Social History Narrative   Not on file   Social Determinants of Health   Financial Resource Strain: Low Risk  (12/27/2021)   Overall Financial Resource Strain (CARDIA)    Difficulty of Paying Living Expenses: Not hard at all  Food Insecurity: No Food Insecurity (01/01/2023)   Hunger Vital Sign    Worried About Running Out of Food in the Last Year: Never true    Ran Out of Food in the Last Year: Never true  Transportation Needs: No Transportation Needs (01/01/2023)   PRAPARE - Administrator, Civil Service (Medical): No    Lack of Transportation (Non-Medical): No  Physical Activity: Sufficiently Active (12/20/2020)   Exercise Vital Sign    Days of Exercise per Week: 3 days    Minutes of Exercise per Session: 60 min  Stress: No Stress Concern Present (12/27/2021)   [employer] of Occupational Health - Occupational Stress Questionnaire    Feeling of Stress : Not at all  Social Connections: Moderately  Integrated (12/27/2021)   Social Connection and Isolation Panel [NHANES]    Frequency of Communication with Friends and Family: More than three times a week    Frequency of Social Gatherings with Friends and Family: More than three times a week    Attends Religious Services: More than 4 times per year    Active Member of [employer] or Organizations: Yes    Attends Banker Meetings: More than 4 times per year    Marital Status: Widowed   Family History  Problem Relation Age of Onset   Hypertension Mother    Arthritis Mother    Asthma Mother    Diabetes Mother    Hyperlipidemia Mother    Cancer Mother        uterine cancer   CAD Sister    Heart failure Sister    Hyperlipidemia  Sister    Heart disease Sister    Early death Father    Asthma Brother    COPD Brother    Depression Brother    AAA (abdominal aortic aneurysm) Daughter    Hearing loss Daughter    Early death Daughter 28       Spinal miningitis   Allergies  Allergen Reactions   Apple Hives and Swelling    A Mixed Blend Apple   Other Hives and Swelling    "Grapple" (hybrid apple & grape)   Prior to Admission medications   Medication Sig Start Date End Date Taking? Authorizing Provider  alendronate (FOSAMAX) 70 MG tablet TAKE 1 TABLET BY MOUTH ONCE A WEEK TAKE  WITH  A  FULL  GLASS  OF  WATER  ON  AN  EMPTY  STOMACH 05/20/23  Yes [name], DO  allopurinol (ZYLOPRIM) 100 MG tablet Take 1 tablet (100 mg total) by mouth daily. 04/03/23  Yes [name], DO  ascorbic acid (VITAMIN C) 500 MG tablet Take 500 mg by mouth daily.   Yes [provider]  aspirin 81 MG chewable tablet Chew 81 mg daily by mouth.   Yes [provider]  atorvastatin (LIPITOR) 40 MG tablet Take 1 tablet (40 mg total) by mouth daily. 02/07/23  Yes [name], DO  carvedilol (COREG) 25 MG tablet Take 1 tablet (25 mg total) by mouth 2 (two) times daily with a meal. 12/23/22  Yes Hilty, Lisette Abu, MD  fluticasone (FLONASE) 50 MCG/ACT nasal spray Place 2 sprays into both nostrils daily. Patient taking differently: Place 2 sprays into both nostrils daily as needed (ear). 08/20/22  Yes [name], DO  furosemide (LASIX) 40 MG tablet Take 1 tablet by mouth once daily 05/20/23  Yes Wendling, Jilda Roche, DO  hydrALAZINE (APRESOLINE) 25 MG tablet Take 1 tablet (25 mg total) by mouth 2 (two) times daily. 12/23/22  Yes Hilty, Lisette Abu, MD  meloxicam (MOBIC) 15 MG tablet TAKE 1 TABLET BY MOUTH ONCE DAILY AS NEEDED FOR PAIN 05/20/23  Yes [name], DO  metFORMIN (GLUCOPHAGE) 500 MG tablet Take 1 tablet (500 mg total) by mouth daily with breakfast. 02/07/23  Yes Wendling,  Jilda Roche, DO  Multiple Vitamin (MULITIVITAMIN WITH MINERALS) TABS Take 1 tablet by mouth daily. Omega XL Joint and bone with fish oil   Yes [provider]  potassium gluconate 595 (99 K) MG TABS tablet Take 595 mg by mouth daily. 07/28/17  Yes [provider]  RESTASIS 0.05 % ophthalmic emulsion Place 1 drop into both eyes 3 (three) times a week.  01/10/23  Yes [provider]  sacubitril-valsartan (ENTRESTO) 97-103 MG Take 1 tablet by mouth 2 (two) times daily. 12/23/22  Yes Hilty, Lisette Abu, MD  spironolactone (ALDACTONE) 25 MG tablet Take 1 tablet (25 mg total) by mouth daily. 07/11/22 07/06/23 Yes Hilty, Lisette Abu, MD  Vitamin D3 (VITAMIN D) 25 MCG tablet Take 1,000 Units by mouth daily.   Yes [provider]  vitamin E 180 MG (400 UNITS) capsule Take 400 Units by mouth daily.   Yes [provider]     All other systems have been reviewed and were otherwise negative with the exception of those mentioned in the HPI and as above.  Physical Exam: Vitals:   06/04/23 0708  BP: (!) 162/81  Pulse: 72  Resp: 18  Temp: 98.6 F (37 C)  SpO2: 100%    Body mass index is 39.54 kg/m.  General: Alert, no acute distress Cardiovascular: No pedal edema Respiratory: No cyanosis, no use of accessory musculature Skin: No lesions in the area of chief complaint Neurologic: Sensation intact distally Psychiatric: Patient is competent for consent with normal mood and affect Lymphatic: No axillary or cervical lymphadenopathy   Assessment/Plan: Atenosis and instability at L3/4 and L4/5  Plan for Procedure(s): RIGHT-SIDED LUMBAR 3- LUMBAR 4, LUMBAR 4- LUMBAR 5 TRANSFORAMINAL LUMBAR INTERBODY FUSION AND DECOMPRESSION WITH INSTRUMENTATION AND ALLOGRAFT   Jackelyn Hoehn, MD 06/04/2023 8:26 AM

## 2023-06-05 DIAGNOSIS — Z96653 Presence of artificial knee joint, bilateral: Secondary | ICD-10-CM | POA: Diagnosis not present

## 2023-06-05 DIAGNOSIS — M4316 Spondylolisthesis, lumbar region: Secondary | ICD-10-CM | POA: Diagnosis not present

## 2023-06-05 DIAGNOSIS — M48061 Spinal stenosis, lumbar region without neurogenic claudication: Secondary | ICD-10-CM | POA: Diagnosis not present

## 2023-06-05 DIAGNOSIS — I110 Hypertensive heart disease with heart failure: Secondary | ICD-10-CM | POA: Diagnosis not present

## 2023-06-05 DIAGNOSIS — Z7982 Long term (current) use of aspirin: Secondary | ICD-10-CM | POA: Diagnosis not present

## 2023-06-05 DIAGNOSIS — E119 Type 2 diabetes mellitus without complications: Secondary | ICD-10-CM | POA: Diagnosis not present

## 2023-06-05 DIAGNOSIS — M5416 Radiculopathy, lumbar region: Secondary | ICD-10-CM | POA: Diagnosis not present

## 2023-06-05 DIAGNOSIS — I509 Heart failure, unspecified: Secondary | ICD-10-CM | POA: Diagnosis not present

## 2023-06-05 DIAGNOSIS — Z7984 Long term (current) use of oral hypoglycemic drugs: Secondary | ICD-10-CM | POA: Diagnosis not present

## 2023-06-05 LAB — GLUCOSE, CAPILLARY: Glucose-Capillary: 113 mg/dL — ABNORMAL HIGH (ref 70–99)

## 2023-06-05 MED ORDER — METHOCARBAMOL 750 MG PO TABS: 750.0000 mg | ORAL_TABLET | Freq: Four times a day (QID) | ORAL | 2 refills | Status: DC | PRN

## 2023-06-05 MED ORDER — OXYCODONE-ACETAMINOPHEN 5-325 MG PO TABS: 1.0000 | ORAL_TABLET | ORAL | 0 refills | Status: DC | PRN

## 2023-06-05 MED FILL — Thrombin For Soln 20000 Unit: CUTANEOUS | Qty: 1 | Status: AC

## 2023-06-05 NOTE — Care Management Obs Status (Signed)
MEDICARE OBSERVATION STATUS NOTIFICATION   Patient Details  Name: Sharon Yu MRN: [id number] Date of Birth: 01-Dec-1950   Medicare Observation Status Notification Given:  Yes    Kermit Balo, RN 06/05/2023, 10:37 AM

## 2023-06-05 NOTE — Evaluation (Signed)
Physical Therapy Evaluation  Patient Details Name: Sharon Yu MRN: [id number] DOB: 12-Jun-1951 Today's Date: 06/05/2023  History of Present Illness  Pt is a 72 y/o female who presents s/p L3-L5 TLIF on 06/04/2023. PMH significant for CHF, DM, HTN, nonischemic cardiomyopathy, B knee arthroscopies, B TKA.   Clinical Impression  Pt admitted with above diagnosis. At the time of PT eval, pt was able to demonstrate transfers and ambulation with gross supervision for safety and RW for support. Pt was educated on precautions, brace application/wearing schedule, appropriate activity progression, and car transfer. Pt currently with functional limitations due to the deficits listed below (see PT Problem List). Pt will benefit from skilled PT to increase their independence and safety with mobility to allow discharge to the venue listed below.          If plan is discharge home, recommend the following: A little help with walking and/or transfers;Assistance with cooking/housework;Help with stairs or ramp for entrance;Assist for transportation   Can travel by private vehicle        Equipment Recommendations Rolling walker (2 wheels)  Recommendations for Other Services       Functional Status Assessment Patient has had a recent decline in their functional status and demonstrates the ability to make significant improvements in function in a reasonable and predictable amount of time.     Precautions / Restrictions Precautions Precautions: Fall;Back Precaution Booklet Issued: Yes (comment) Precaution Comments: Reviewed handout and pt was cued for precautions during functional mobility. Required Braces or Orthoses: Spinal Brace Spinal Brace: Thoracolumbosacral orthotic;Applied in sitting position Restrictions Weight Bearing Restrictions: No      Mobility  Bed Mobility               General bed mobility comments: Pt was received sitting up in recliner. Pt declined practicing log roll,  however verbally reviewed with pt.    Transfers Overall transfer level: Needs assistance Equipment used: Rolling walker (2 wheels) Transfers: Sit to/from Stand Sit to Stand: Supervision           General transfer comment: VC's for wide BOS and hand placement on seated surface for safety. No assist required. Supervision provided for safety.    Ambulation/Gait Ambulation/Gait assistance: Supervision Gait Distance (Feet): 400 Feet Assistive device: Rolling walker (2 wheels) Gait Pattern/deviations: Step-through pattern, Decreased stride length, Trunk flexed Gait velocity: Decreased Gait velocity interpretation: <1.8 ft/sec, indicate of risk for recurrent falls   General Gait Details: VC's for improved posture, closer walker proximity and forward gaze. No assist required and no overt LOB noted.  Stairs Stairs: Yes Stairs assistance: Contact guard assist Stair Management: One rail Left, Step to pattern, Forwards Number of Stairs: 4 General stair comments: VC's for sequencing and general safety.  Wheelchair Mobility     Tilt Bed    Modified Rankin (Stroke Patients Only)       Balance Overall balance assessment: Mild deficits observed, not formally tested                                           Pertinent Vitals/Pain Pain Assessment Pain Assessment: Faces Faces Pain Scale: Hurts little more Pain Location: incisional Pain Descriptors / Indicators: Operative site guarding, Sore Pain Intervention(s): Limited activity within patient's tolerance, Monitored during session, Repositioned    Home Living Family/patient expects to be discharged to:: Private residence Living Arrangements: Children;Other relatives;Non-relatives/Friends Available Help at  Discharge: Family;Available 24 hours/day Type of Home: House Home Access: Stairs to enter Entrance Stairs-Rails: Right;Left;Can reach both Entrance Stairs-Number of Steps: 4   Home Layout: One  level Home Equipment: Standard Walker;BSC/3in1;Cane - single point (adjustable bed and lift chair)      Prior Function Prior Level of Function : Independent/Modified Independent;Driving                     Extremity/Trunk Assessment   Upper Extremity Assessment Upper Extremity Assessment: Defer to OT evaluation    Lower Extremity Assessment Lower Extremity Assessment: Generalized weakness (Mild; consistent with pre-op diagnosis)    Cervical / Trunk Assessment Cervical / Trunk Assessment: Back Surgery  Communication   Communication Communication: No apparent difficulties  Cognition Arousal: Alert Behavior During Therapy: WFL for tasks assessed/performed Overall Cognitive Status: Within Functional Limits for tasks assessed                                          General Comments      Exercises     Assessment/Plan    PT Assessment Patient needs continued PT services  PT Problem List Decreased strength;Decreased activity tolerance;Decreased balance;Decreased mobility;Decreased knowledge of use of DME;Decreased safety awareness;Decreased knowledge of precautions;Pain       PT Treatment Interventions DME instruction;Gait training;Stair training;Functional mobility training;Therapeutic activities;Therapeutic exercise;Balance training;Patient/family education    PT Goals (Current goals can be found in the Care Plan section)  Acute Rehab PT Goals Patient Stated Goal: Home today PT Goal Formulation: With patient Time For Goal Achievement: 06/12/23 Potential to Achieve Goals: Good    Frequency Min 5X/week     Co-evaluation               AM-PAC PT "6 Clicks" Mobility  Outcome Measure Help needed turning from your back to your side while in a flat bed without using bedrails?: A Little Help needed moving from lying on your back to sitting on the side of a flat bed without using bedrails?: A Little Help needed moving to and from a bed to a  chair (including a wheelchair)?: A Little Help needed standing up from a chair using your arms (e.g., wheelchair or bedside chair)?: A Little Help needed to walk in hospital room?: A Little Help needed climbing 3-5 steps with a railing? : A Little 6 Click Score: 18    End of Session Equipment Utilized During Treatment: Gait belt;Back brace Activity Tolerance: Patient tolerated treatment well Patient left: in chair;with call bell/phone within reach Nurse Communication: Mobility status PT Visit Diagnosis: Unsteadiness on feet (R26.81);Pain Pain - part of body:  (back)    Time: 9147-8295 PT Time Calculation (min) (ACUTE ONLY): 19 min   Charges:   PT Evaluation $PT Eval Low Complexity: 1 Low   PT General Charges $$ ACUTE PT VISIT: 1 Visit         Conni Slipper, PT, DPT Acute Rehabilitation Services Secure Chat Preferred Office: [phone]   Sharon Yu 06/05/2023, 12:08 PM

## 2023-06-05 NOTE — Progress Notes (Signed)
Patient alert and oriented, ambulate, void. Drain remove per order, surgical site and dressing clean and dry no sign of infection. D/c instructions explain and given to the patient all questions answered. Patient d/c home with RW per order.

## 2023-06-05 NOTE — Evaluation (Signed)
Occupational Therapy Evaluation and Discharge Patient Details Name: Sharon Yu MRN: [id number] DOB: 1951-04-17 Today's Date: 06/05/2023   History of Present Illness Pt is a 72 yo female s/p lumbar decompression, L3-4, L4-5, including bilateral partial facetectomy, and bilateral lumbar decompression. Right-sided L3-4, L4-5 transforaminal lumbar interbody fusion. Left-sided L3-4, L4-5 posterolateral fusion. Due to ongoing pain in the bilateral legs MRI reveals stenosis and instability at L3/4 and L4/5.PHMx: OA, back pain, CHF, DM, HTN   Clinical Impression   This 72 yo female admitted and underwent above presents to acute OT with all post op back education completed, we will D/C her from acute OT.       If plan is discharge home, recommend the following: Assistance with cooking/housework;Help with stairs or ramp for entrance;Assist for transportation    Functional Status Assessment  Patient has had a recent decline in their functional status and demonstrates the ability to make significant improvements in function in a reasonable and predictable amount of time. (without further skilled OT needs, all education and post op handout provided.)  Equipment Recommendations  Other (comment) (rollling walker\)       Precautions / Restrictions Precautions Precautions: Back Precaution Booklet Issued: Yes (comment) Required Braces or Orthoses: Spinal Brace Spinal Brace: Thoracolumbosacral orthotic Restrictions Weight Bearing Restrictions: No      Mobility Bed Mobility               General bed mobility comments: Pt sitting on EOB upon my arrival    Transfers Overall transfer level: Modified independent Equipment used: Rolling walker (2 wheels)                      Balance Overall balance assessment: Mild deficits observed, not formally tested                                         ADL either performed or assessed with clinical judgement   ADL  Overall ADL's : Modified independent                                       General ADL Comments: Educated on use of 2 cups for brushing teeth (pt returned demo), using wet wipes for back peri care, sequence of dressing, toilet transfers, building up sitting tolerance     Vision Patient Visual Report: No change from baseline              Pertinent Vitals/Pain Pain Assessment Pain Assessment: 0-10 Pain Score: 4  Pain Location: incisional Pain Descriptors / Indicators: Aching, Sore Pain Intervention(s): Limited activity within patient's tolerance, Monitored during session, Repositioned     Extremity/Trunk Assessment Upper Extremity Assessment Upper Extremity Assessment: Overall WFL for tasks assessed           Communication Communication Communication: No apparent difficulties   Cognition Arousal: Alert Behavior During Therapy: WFL for tasks assessed/performed Overall Cognitive Status: Within Functional Limits for tasks assessed                                                  Home Living Family/patient expects to be discharged to:: Private residence Living Arrangements: Children;Other relatives;Non-relatives/Friends  Available Help at Discharge: Family;Available 24 hours/day Type of Home: House Home Access: Stairs to enter [employer] of Steps: 4 Entrance Stairs-Rails: Right;Left;Can reach both Home Layout: One level     Bathroom Shower/Tub: Walk-in shower;Door   Bathroom Toilet: Handicapped height     Home Equipment: Standard Walker;BSC/3in1;Cane - single point (adjustable bed and lift chair)          Prior Functioning/Environment Prior Level of Function : Independent/Modified Independent;Driving                        OT Problem List: Decreased strength;Decreased range of motion;Impaired balance (sitting and/or standing);Pain         OT Goals(Current goals can be found in the care plan section)  Acute Rehab OT Goals Patient Stated Goal: to go home today         AM-PAC OT "6 Clicks" Daily Activity     Outcome Measure Help from another person eating meals?: None Help from another person taking care of personal grooming?: None Help from another person toileting, which includes using toliet, bedpan, or urinal?: None Help from another person bathing (including washing, rinsing, drying)?: None Help from another person to put on and taking off regular upper body clothing?: None Help from another person to put on and taking off regular lower body clothing?: None 6 Click Score: 24   End of Session Equipment Utilized During Treatment: Rolling walker (2 wheels) Nurse Communication:  (no further OT needs)  Activity Tolerance: Patient tolerated treatment well Patient left: in chair;with call bell/phone within reach  OT Visit Diagnosis: Unsteadiness on feet (R26.81);Other abnormalities of gait and mobility (R26.89);Muscle weakness (generalized) (M62.81);Pain Pain - part of body:  (incisonal)                Time: 1610-9604 OT Time Calculation (min): 46 min Charges:  OT General Charges $OT Visit: 1 Visit OT Evaluation $OT Eval Moderate Complexity: 1 Mod OT Treatments $Self Care/Home Management : 23-37 mins  Lindon Romp OT Acute Rehabilitation Services Office [phone]    Evette Georges 06/05/2023, 9:59 AM

## 2023-06-05 NOTE — Care Management CC44 (Signed)
Condition Code 44 Documentation Completed  Patient Details  Name: Memory Heinrichs MRN: [id number] Date of Birth: 1951-01-28   Condition Code 44 given:  Yes Patient signature on Condition Code 44 notice:  Yes Documentation of 2 MD's agreement:  Yes Code 44 added to claim:  Yes    Kermit Balo, RN 06/05/2023, 10:37 AM

## 2023-06-05 NOTE — Progress Notes (Signed)
    Patient doing well  Patient denies leg pain   Physical Exam: Vitals:   06/05/23 0452 06/05/23 0732  BP: 105/70 116/68  Pulse: 89 82  Resp: 16 16  Temp: 98.2 F (36.8 C) 98.7 F (37.1 C)  SpO2: 98% 100%    Dressing in place NVI  POD #1 s/p L3-L5 decompression and fusion, doing well  - up with PT/OT, encourage ambulation - Percocet for pain, Robaxin for muscle spasms - d/c home today with f/u in 2 weeks

## 2023-06-09 ENCOUNTER — Encounter (HOSPITAL_COMMUNITY): Payer: Self-pay | Admitting: Orthopedic Surgery

## 2023-06-11 NOTE — Discharge Summary (Signed)
Patient ID: Sharon Yu MRN: [id number] DOB/AGE: Jul 26, 1951 72 y.o.  Admit date: 06/04/2023 Discharge date: 06/05/3023  Admission Diagnoses:  Principal Problem:   Radiculopathy of lumbar region   Discharge Diagnoses:  Same  Past Medical History:  Diagnosis Date   Arthritis    Back pain    buldging disc   CHF (congestive heart failure) (HCC)    Chronic back pain    Diabetes mellitus without complication (HCC)    Hyperlipidemia    takes Crestor daily   Hypertension    takes Maxzide and Metoprolol daily along with Ramipril   Hypopotassemia    takes Potassium pill daily   Joint pain    Joint swelling    Nonischemic cardiomyopathy (HCC)    a. 07/2017: EF reduced to 25-30%, cath showing normal cors.    Shortness of breath    with exertion    Surgeries: Procedure(s): RIGHT-SIDED LUMBAR THREE- LUMBAR FOUR, LUMBAR FOUR- LUMBAR FIVE TRANSFORAMINAL LUMBAR INTERBODY FUSION AND DECOMPRESSION WITH INSTRUMENTATION AND ALLOGRAFT on 06/04/2023   Consultants: None  Discharged Condition: Improved  Hospital Course: Sharon Yu is an 71 y.o. female who was admitted 06/04/2023 for operative treatment of Radiculopathy of lumbar region. Patient has severe unremitting pain that affects sleep, daily activities, and work/hobbies. After pre-op clearance the patient was taken to the operating room on 06/04/2023 and underwent  Procedure(s): RIGHT-SIDED LUMBAR THREE- LUMBAR FOUR, LUMBAR FOUR- LUMBAR FIVE TRANSFORAMINAL LUMBAR INTERBODY FUSION AND DECOMPRESSION WITH INSTRUMENTATION AND ALLOGRAFT.    Patient was given perioperative antibiotics:  Anti-infectives (From admission, onward)    Start     Dose/Rate Route Frequency Ordered Stop   06/04/23 1600  ceFAZolin (ANCEF) IVPB 2g/100 mL premix        2 g 200 mL/hr over 30 Minutes Intravenous Every 8 hours 06/04/23 1559 06/05/23 0818   06/04/23 0645  ceFAZolin (ANCEF) IVPB 2g/100 mL premix        2 g 200 mL/hr over 30 Minutes  Intravenous On call to O.R. 06/04/23 4098 06/04/23 1245   06/04/23 0644  ceFAZolin (ANCEF) 2-4 GM/100ML-% IVPB       Note to Pharmacy: Darrick Huntsman: cabinet override      06/04/23 0644 06/04/23 0857        Patient was given sequential compression devices, early ambulation to prevent DVT.  Patient benefited maximally from hospital stay and there were no complications.    Recent vital signs: BP 116/68 (BP Location: Left Arm)   Pulse 82   Temp 98.7 F (37.1 C) (Oral)   Resp 16   Ht 5\' 6"  (1.676 m)   Wt 111.1 kg   SpO2 100%   BMI 39.54 kg/m    Discharge Medications:   Allergies as of 06/05/2023       Reactions   Apple Hives, Swelling   A Mixed Blend Apple   Other Hives, Swelling   "Grapple" (hybrid apple & grape)        Medication List     TAKE these medications    alendronate 70 MG tablet Commonly known as: FOSAMAX TAKE 1 TABLET BY MOUTH ONCE A WEEK TAKE  WITH  A  FULL  GLASS  OF  WATER  ON  AN  EMPTY  STOMACH   allopurinol 100 MG tablet Commonly known as: ZYLOPRIM Take 1 tablet (100 mg total) by mouth daily.   ascorbic acid 500 MG tablet Commonly known as: VITAMIN C Take 500 mg by mouth daily.   aspirin 81  MG chewable tablet Chew 81 mg daily by mouth.   atorvastatin 40 MG tablet Commonly known as: LIPITOR Take 1 tablet (40 mg total) by mouth daily.   carvedilol 25 MG tablet Commonly known as: COREG Take 1 tablet (25 mg total) by mouth 2 (two) times daily with a meal.   Entresto 97-103 MG Generic drug: sacubitril-valsartan Take 1 tablet by mouth 2 (two) times daily.   fluticasone 50 MCG/ACT nasal spray Commonly known as: FLONASE Place 2 sprays into both nostrils daily. What changed:  when to take this reasons to take this   furosemide 40 MG tablet Commonly known as: LASIX Take 1 tablet by mouth once daily   hydrALAZINE 25 MG tablet Commonly known as: APRESOLINE Take 1 tablet (25 mg total) by mouth 2 (two) times daily.   meloxicam  15 MG tablet Commonly known as: MOBIC TAKE 1 TABLET BY MOUTH ONCE DAILY AS NEEDED FOR PAIN   metFORMIN 500 MG tablet Commonly known as: GLUCOPHAGE Take 1 tablet (500 mg total) by mouth daily with breakfast.   methocarbamol 750 MG tablet Commonly known as: Robaxin-750 Take 1 tablet (750 mg total) by mouth every 6 (six) hours as needed for muscle spasms.   multivitamin with minerals Tabs tablet Take 1 tablet by mouth daily. Omega XL Joint and bone with fish oil   oxyCODONE-acetaminophen 5-325 MG tablet Commonly known as: PERCOCET/ROXICET Take 1-2 tablets by mouth every 4 (four) hours as needed for severe pain.   potassium gluconate 595 (99 K) MG Tabs tablet Take 595 mg by mouth daily.   Restasis 0.05 % ophthalmic emulsion Generic drug: cycloSPORINE Place 1 drop into both eyes 3 (three) times a week.   spironolactone 25 MG tablet Commonly known as: ALDACTONE Take 1 tablet (25 mg total) by mouth daily.   vitamin D3 25 MCG tablet Commonly known as: CHOLECALCIFEROL Take 1,000 Units by mouth daily.   vitamin E 180 MG (400 UNITS) capsule Take 400 Units by mouth daily.        Diagnostic Studies: DG Lumbar Spine 2-3 Views  Result Date: 06/04/2023 CLINICAL DATA:  Lumbar decompression and fusion EXAM: LUMBAR SPINE - 2-3 VIEW COMPARISON:  MRI 04/16/2023 FINDINGS: Two low resolution intraoperative spot views of the lumbar spine. Total fluoroscopy time is 1 minutes 31 seconds, fluoroscopic dose of 72.1 mGy. The images demonstrate posterior lumbar fusion hardware L3 through L5 with interbody devices at L3-L4 and L4-L5. IMPRESSION: Intraoperative fluoroscopic assistance provided during lumbar fusion. Electronically Signed   By: Jasmine Pang M.D.   On: 06/04/2023 15:30   DG Lumbar Spine 1 View  Result Date: 06/04/2023 CLINICAL DATA:  Intraop imaging localization EXAM: LUMBAR SPINE - 1 VIEW COMPARISON:  MRI 04/16/2023 FINDINGS: Limited intraoperative lateral view of the lumbar spine at  0919 hours. Linear localizing instrument overlying the posterior soft tissues at the level of L4 and the upper sacrum. IMPRESSION: Limited intraoperative view of the lumbar spine for intraoperative localization purposes Electronically Signed   By: Jasmine Pang M.D.   On: 06/04/2023 15:28   DG C-Arm 1-60 Min-No Report  Result Date: 06/04/2023 Fluoroscopy was utilized by the requesting physician.  No radiographic interpretation.   DG C-Arm 1-60 Min-No Report  Result Date: 06/04/2023 Fluoroscopy was utilized by the requesting physician.  No radiographic interpretation.   DG C-Arm 1-60 Min-No Report  Result Date: 06/04/2023 Fluoroscopy was utilized by the requesting physician.  No radiographic interpretation.   DG C-Arm 1-60 Min-No Report  Result Date: 06/04/2023 Fluoroscopy  was utilized by the requesting physician.  No radiographic interpretation.   DG C-Arm 1-60 Min-No Report  Result Date: 06/04/2023 Fluoroscopy was utilized by the requesting physician.  No radiographic interpretation.    Disposition: Discharge disposition: 01-Home or Self Care        POD #1 s/p L3-L5 decompression and fusion, doing well   - up with PT/OT, encourage ambulation - Percocet for pain, Robaxin for muscle spasms -Scripts for pain sent to pharmacy electronically  -D/C instructions sheet printed and in chart -D/C today  -F/U in office 2 weeks   Signed: Eilene Ghazi Jacquis Paxton 06/11/2023, 2:07 PM

## 2023-06-12 MED FILL — Sodium Chloride IV Soln 0.9%: INTRAVENOUS | Qty: 1000 | Status: AC

## 2023-06-12 MED FILL — Heparin Sodium (Porcine) Inj 1000 Unit/ML: INTRAMUSCULAR | Qty: 30 | Status: AC

## 2023-06-20 ENCOUNTER — Other Ambulatory Visit: Payer: Self-pay | Admitting: Internal Medicine

## 2023-07-18 DIAGNOSIS — M5416 Radiculopathy, lumbar region: Secondary | ICD-10-CM | POA: Diagnosis not present

## 2023-07-29 ENCOUNTER — Ambulatory Visit: Payer: Medicare PPO | Attending: Internal Medicine | Admitting: Internal Medicine

## 2023-07-29 ENCOUNTER — Encounter: Payer: Self-pay | Admitting: Internal Medicine

## 2023-07-29 VITALS — BP 138/80 | HR 95 | Ht 66.0 in | Wt 239.6 lb

## 2023-07-29 DIAGNOSIS — I10 Essential (primary) hypertension: Secondary | ICD-10-CM | POA: Diagnosis not present

## 2023-07-29 DIAGNOSIS — I7121 Aneurysm of the ascending aorta, without rupture: Secondary | ICD-10-CM

## 2023-07-29 DIAGNOSIS — R600 Localized edema: Secondary | ICD-10-CM | POA: Diagnosis not present

## 2023-07-29 DIAGNOSIS — I428 Other cardiomyopathies: Secondary | ICD-10-CM

## 2023-07-29 DIAGNOSIS — I5042 Chronic combined systolic (congestive) and diastolic (congestive) heart failure: Secondary | ICD-10-CM

## 2023-07-29 NOTE — Patient Instructions (Signed)
Medication Instructions:  NO CHANGES  *If you need a refill on your cardiac medications before your next appointment, please call your pharmacy*   Testing/Procedures: Your physician has requested that you have an echocardiogram. Echocardiography is a painless test that uses sound waves to create images of your heart. It provides your doctor with information about the size and shape of your heart and how well your heart's chambers and valves are working. This procedure takes approximately one hour. There are no restrictions for this procedure. Please do NOT wear cologne, perfume, aftershave, or lotions (deodorant is allowed). Please arrive 15 minutes prior to your appointment time.  Please note: We ask at that you not bring children with you during ultrasound (echo/ vascular) testing. Due to room size and safety concerns, children are not allowed in the ultrasound rooms during exams. Our front office staff cannot provide observation of children in our lobby area while testing is being conducted. An adult accompanying a patient to their appointment will only be allowed in the ultrasound room at the discretion of the ultrasound technician under special circumstances. We apologize for any inconvenience. Schedule before end of 2024   Follow-Up: At [facility], you and your health needs are our priority.  As part of our continuing mission to provide you with exceptional heart care, we have created designated Provider Care Teams.  These Care Teams include your primary Cardiologist (physician) and Advanced Practice Providers (APPs -  Physician Assistants and Nurse Practitioners) who all work together to provide you with the care you need, when you need it.  We recommend signing up for the patient portal called "MyChart".  Sign up information is provided on this After Visit Summary.  MyChart is used to connect with patients for Virtual Visits (Telemedicine).  Patients are able to view lab/test  results, encounter notes, upcoming appointments, etc.  Non-urgent messages can be sent to your provider as well.   To learn more about what you can do with MyChart, go to [email or web address].    Your next appointment:   1 year follow up with Dr. Rennis Golden

## 2023-07-29 NOTE — Progress Notes (Signed)
OFFICE NOTE  Chief Complaint:  Follow-up heart failure  Primary Care Physician: [name], DO  HPI:  Sharon Yu is a 72 y.o. female with a past medial history significant for hypertension, dyslipidemia, recent hospitalization for heart failure.  She was found to have a nonischemic cardiomyopathy with normal coronary arteries on cardiac catheterization in November 2018.  LVEF on echo was 25-30%.  She was started on carvedilol and losartan.  After discharge blood pressure remained elevated and her losartan was switched to olmesartan.  Carvedilol was then increased.  She reports she is doing well on the medications.  Initially blood pressure was elevated today however repeat came down to 124/78.  Weight is within a few pounds where it had been previously.  She denies any new or worsening edema.  She denies any chest pain.  She reports that she is going to start to exercise more with silver sneakers.  She attends the [facility] in [place] [facility].  She was also found to have a 4.3 cm ascending aortic aneurysm.  This will need to be reimaged in approximately 1 year.  04/08/2018  Sharon Yu returns today for heart failure follow-up.  We recently performed a repeat echocardiogram, she indicates improvement in LVEF from 25 to 30% up to 40 to 45%.  She is tolerating the addition of carvedilol and is on losartan.  She also takes low-dose aspirin, atorvastatin and furosemide 40 mg daily.  She would does report symptomatic improvement and currently endorses NYHA class II symptoms.  Echo shows stability of her 4.3 cm ascending aortic aneurysm and we will recommend following this annually.  We may consider a CT scan of the aorta during the next study to more fully visualize the aortic arch and proximal descending aorta.  07/09/2018  Sharon Yu is seen today for follow-up heart failure.  Recently her LVEF improved from 25 to 30% up to 40 to 45%.  She is on moderate dose Entresto.  She endorses no  more than NYHA class II symptoms.  She was noted to have a 4.3 cm ascending aortic aneurysm which will need routine follow-up.  Her last echo was in July 2019.  She is had about 6 pound weight loss this year.  She is on moderate dose Lasix.  She seems to be tolerating medications and notes her blood pressure at home is around the 120s over low 70s.  I think there is room to increase her Entresto further.  She says it cost her about $80 for a 66-month supply which she said is affordable at this time and she is not receiving patient assistance because she did not qualify financially.  04/28/2019  Sharon Yu seen today for heart failure follow-up.  Overall she says she is feeling well.  She had a recent echo which showed a small interval improvement in LV function up to 45 to 50%.  She has had escalating doses of Entresto now on max dose 97/103 mg twice daily.  She is also on carvedilol 6.25 mg twice daily.  Overall she endorses NYHA class I-II symptoms.  EKG today shows sinus rhythm at 79.  11/03/2019  Sharon Yu returns today for follow-up.  Sadly she reports that her brother died today.  She says she was with family but because she did not want to miss the appointment came to the office.  She was interested in follow-up of her echo which shows a stable LVEF 45 to 50% but no significant improvement despite maximizing  the dose of her Entresto.  Blood pressure would not likely allow increases in her medication.  Heart rate was elevated today however she was rushed to get here and under stress.  He says in general her blood pressure is in the low 100s and heart rate in the 60s.  06/19/2020  Sharon Yu is seen today in follow-up.  She had an echo earlier this year in January 2021 which showed fairly stable LVEF of 45 to 50%.  This is a nonischemic cardiomyopathy with normal coronaries by cath in November 2018.  She is now on maximum dose Entresto of 97/103 mg twice daily.  She is also recently had an  increase in carvedilol to 12.5 mg twice daily by her PCP for persistently elevated blood pressure.  Today is 157/89 and in general she says it sits around 140/90.  This very well may be a hypertensive cardiomyopathy.  At present she has no more than class II heart failure symptoms.  Weight is still an issue with morbid obesity and a BMI of 40.  She says she does sleep well at night denies snoring but has never had sleep study.  EKG shows sinus rhythm.  Labs from March 2021 showed total cholesterol 111, HDL 43, LDL 45 and triglycerides 109.  11/16/2020  Sharon Yu is seen today for follow-up.  Overall she is doing well.  Blood pressure was again elevated today 161/93.  She says at home it is lower however typically not better than 140 systolic.  We had repeated an echo recently and unfortunately despite maximal treatment with Entresto her LVEF is slightly lower at 40 to 45%.  This may be related to interpretation however I feel that with her blood pressure still poorly controlled that that is contributing to this.  Overall she has likely NYHA class II symptoms.  02/20/2021  Sharon Yu is seen today in follow-up of her heart failure.  Unfortunately she never started the Jardiance because she has concerns about side effects and was not able to afford the medication.  It also appears that she was noncompliant with her statin.  Her cholesterol has more than doubled with total cholesterol now 203 up from 111 and LDL cholesterol 126 up from 45.  She initially said that she was not compliant with her diet and had missed a few doses of her medicine and then subsequently said she stopped it completely and was trying over-the-counter red yeast rice.  She has now gone back to 40 mg daily atorvastatin.  01/15/2022  Sharon Yu returns today for follow-up.  She had a repeat echo in February which shows a stable LVEF 40 to 45% and mildly dilated aorta at 40 mm.  This is consistent with earlier measurements.  She is  maximized on Entresto and a higher dose of carvedilol.  We have previously discussed an SGLT2 inhibitor and I prescribed it for her but she never took the medicine.  She was concerned about possible side effects and adding another medicine.  She had also stopped her atorvastatin in favor of trying herbal supplements.  This was not effective and she did actually resume that.  Cholesterol in December showed the LDL was back down to 58.  Recent A1c was 6.0.  07/11/2022  Sharon Yu is seen today in follow-up.  Unfortunately her brother passed away recently.  She has been under quite a bit of stress.  Her blood pressure was elevated today although has been elevated somewhat recently.  She had previously tried  Jardiance but had some urinary tract issues with that and discontinued it.  Otherwise she is on guideline directed medical therapy with high-dose Entresto, carvedilol and furosemide as well as hydralazine.  Blood pressure is running 150/90 today.  She denies any heart failure symptoms or chest pain.  07/29/2023  Sharon Yu is seen today in follow-up for heart failure.  Overall she seems to be asymptomatic.  She had recent lower back surgery.  She is recovering from that.  She does report some improvement in back pain.  She has been having some swelling of her right lower extremity.  She might benefit from a compression stocking.  She is on her diuretics.  She says that sometimes she does not take the full dose regularly because of needing to urinate a lot and it is difficult for her to get to the bathroom since she had back surgery.  I added Aldactone to her heart failure regimen.  She is on high dose Entresto, carvedilol and hydralazine.  She was concerned about the side effects of SGLT2 inhibitors.  PMHx:  Past Medical History:  Diagnosis Date   Arthritis    Back pain    buldging disc   CHF (congestive heart failure) (HCC)    Chronic back pain    Diabetes mellitus without complication (HCC)     Hyperlipidemia    takes Crestor daily   Hypertension    takes Maxzide and Metoprolol daily along with Ramipril   Hypopotassemia    takes Potassium pill daily   Joint pain    Joint swelling    Nonischemic cardiomyopathy (HCC)    a. 07/2017: EF reduced to 25-30%, cath showing normal cors.    Shortness of breath    with exertion    Past Surgical History:  Procedure Laterality Date   bilateral knee arthroscopies     carpel tunnel     bilateral   CHOLECYSTECTOMY     colonscopy     JOINT REPLACEMENT Left    RIGHT/LEFT HEART CATH AND CORONARY ANGIOGRAPHY N/A 07/17/2017   Procedure: RIGHT/LEFT HEART CATH AND CORONARY ANGIOGRAPHY;  Surgeon: Swaz[place], Peter M, MD;  Location: [facility] INVASIVE CV LAB;  Service: Cardiovascular;  Laterality: N/A;   TOTAL KNEE ARTHROPLASTY Right 02/19/2013   Procedure: TOTAL KNEE ARTHROPLASTY;  Surgeon: Thera Flake., MD;  Location: [facility] OR;  Service: Orthopedics;  Laterality: Right;  right total knee arthroplasty   TOTAL KNEE REVISION Left 07/02/2013   Procedure: LEFT TOTAL KNEE TIBIA REVISION;  Surgeon: Yu Lewandowsky, MD;  Location: [facility] OR;  Service: Orthopedics;  Laterality: Left;   TRANSFORAMINAL LUMBAR INTERBODY FUSION (TLIF) WITH PEDICLE SCREW FIXATION 2 LEVEL Right 06/04/2023   Procedure: RIGHT-SIDED LUMBAR THREE- LUMBAR FOUR, LUMBAR FOUR- LUMBAR FIVE TRANSFORAMINAL LUMBAR INTERBODY FUSION AND DECOMPRESSION WITH INSTRUMENTATION AND ALLOGRAFT;  Surgeon: Estill Bamberg, MD;  Location: [facility] OR;  Service: Orthopedics;  Laterality: Right;    FAMHx:  Family History  Problem Relation Age of Onset   Hypertension Mother    Arthritis Mother    Asthma Mother    Diabetes Mother    Hyperlipidemia Mother    Cancer Mother        uterine cancer   CAD Sister    Heart failure Sister    Hyperlipidemia Sister    Heart disease Sister    Early death Father    Asthma Brother    COPD Brother    Depression Brother    AAA (abdominal aortic aneurysm) Daughter  Hearing loss  Daughter    Early death Daughter 21       Spinal miningitis    SOCHx:   reports that she has never smoked. She has never used smokeless tobacco. She reports that she does not drink alcohol and does not use drugs.  ALLERGIES:  Allergies  Allergen Reactions   Apple Hives and Swelling    A Mixed Blend Apple   Other Hives and Swelling    "Grapple" (hybrid apple & grape)    ROS: Pertinent items noted in HPI and remainder of comprehensive ROS otherwise negative.  HOME MEDS: Current Outpatient Medications on File Prior to Visit  Medication Sig Dispense Refill   alendronate (FOSAMAX) 70 MG tablet TAKE 1 TABLET BY MOUTH ONCE A WEEK TAKE  WITH  A  FULL  GLASS  OF  WATER  ON  AN  EMPTY  STOMACH 12 tablet 0   allopurinol (ZYLOPRIM) 100 MG tablet Take 1 tablet (100 mg total) by mouth daily. 90 tablet 1   ascorbic acid (VITAMIN C) 500 MG tablet Take 500 mg by mouth daily.     aspirin 81 MG chewable tablet Chew 81 mg daily by mouth.     atorvastatin (LIPITOR) 40 MG tablet Take 1 tablet (40 mg total) by mouth daily. 90 tablet 3   carvedilol (COREG) 25 MG tablet TAKE 1 TABLET BY MOUTH TWICE DAILY WITH A MEAL 180 tablet 1   fluticasone (FLONASE) 50 MCG/ACT nasal spray Place 2 sprays into both nostrils daily. (Patient taking differently: Place 2 sprays into both nostrils daily as needed (ear).) 16 g 5   furosemide (LASIX) 40 MG tablet Take 1 tablet by mouth once daily 90 tablet 0   hydrALAZINE (APRESOLINE) 25 MG tablet Take 1 tablet by mouth twice daily 180 tablet 1   meloxicam (MOBIC) 15 MG tablet TAKE 1 TABLET BY MOUTH ONCE DAILY AS NEEDED FOR PAIN 30 tablet 0   metFORMIN (GLUCOPHAGE) 500 MG tablet Take 1 tablet (500 mg total) by mouth daily with breakfast. 90 tablet 3   methocarbamol (ROBAXIN-750) 750 MG tablet Take 1 tablet (750 mg total) by mouth every 6 (six) hours as needed for muscle spasms. 30 tablet 2   Multiple Vitamin (MULITIVITAMIN WITH MINERALS) TABS Take 1 tablet by mouth daily. Omega  XL Joint and bone with fish oil     oxyCODONE-acetaminophen (PERCOCET/ROXICET) 5-325 MG tablet Take 1-2 tablets by mouth every 4 (four) hours as needed for severe pain. 30 tablet 0   potassium gluconate 595 (99 K) MG TABS tablet Take 595 mg by mouth daily.     RESTASIS 0.05 % ophthalmic emulsion Place 1 drop into both eyes 3 (three) times a week.     sacubitril-valsartan (ENTRESTO) 97-103 MG Take 1 tablet by mouth twice daily 180 tablet 1   spironolactone (ALDACTONE) 25 MG tablet Take 1 tablet by mouth once daily 90 tablet 1   Vitamin D3 (VITAMIN D) 25 MCG tablet Take 1,000 Units by mouth daily.     vitamin E 180 MG (400 UNITS) capsule Take 400 Units by mouth daily.     No current facility-administered medications on file prior to visit.    LABS/IMAGING: No results found for this or any previous visit (from the past 48 hour(s)). No results found.  LIPID PANEL:    Component Value Date/Time   CHOL 108 02/07/2023 1032   TRIG 92.0 02/07/2023 1032   HDL 45.20 02/07/2023 1032   CHOLHDL 2 02/07/2023 1032  VLDL 18.4 02/07/2023 1032   LDLCALC 44 02/07/2023 1032   LDLCALC 58 08/03/2021 1515     WEIGHTS: Wt Readings from Last 3 Encounters:  07/29/23 239 lb 9.6 oz (108.7 kg)  06/04/23 245 lb (111.1 kg)  05/22/23 246 lb 11.2 oz (111.9 kg)    VITALS: BP 138/80 (BP Location: Left Arm, Patient Position: Sitting, Cuff Size: Normal)   Pulse 95   Ht 5\' 6"  (1.676 m)   Wt 239 lb 9.6 oz (108.7 kg)   SpO2 96%   BMI 38.67 kg/m   EXAM: General appearance: alert, no distress and morbidly obese Neck: no carotid bruit, no JVD and thyroid not enlarged, symmetric, no tenderness/mass/nodules Lungs: clear to auscultation bilaterally Heart: regular rate and rhythm, S1, S2 normal, no murmur, click, rub or gallop Abdomen: soft, non-tender; bowel sounds normal; no masses,  no organomegaly Extremities: extremities normal, atraumatic, no cyanosis or edema Pulses: 2+ and symmetric Skin: Skin color,  texture, turgor normal. No rashes or lesions Neurologic: Grossly normal Psych: Pleasant  EKG: EKG Interpretation Date/Time:  Tuesday July 29 2023 11:07:51 EST Ventricular Rate:  92 PR Interval:  152 QRS Duration:  92 QT Interval:  340 QTC Calculation: 420 R Axis:   68  Text Interpretation: Normal sinus rhythm Cannot rule out Anterior infarct , age undetermined When compared with ECG of 14-Jul-2017 18:16, PREVIOUS ECG IS PRESENT Confirmed by Zoila Shutter [phone]) on 07/29/2023 11:19:20 AM    ASSESSMENT: Chronic systolic congestive heart failure-NYHA class II symptoms, LVEF 40-45% (10/2021) Nonischemic cardiomyopathy, normal coronary arteries by heart catheterization 07/2017 Hypertension Dyslipidemia Morbid obesity 4.3 cm ascending aortic aneurysm - measured 4.0 by echo (10/2021) Borderline diabetes-A1c 6.0  PLAN: 1.   Sharon Yu seems to be doing well without any acute heart failure symptoms.  Her last echo showed LVEF 40 to 45%.  I like to repeat that prior to the end of this year.  She had recent back surgery and has had issues with asymmetric swelling worse on the right leg than the left.  I think this is likely due to neuropathy.  As she is already on diuretics, she might benefit from a right lower extremity compression stocking.  Will provide a prescription for that today.  Otherwise no changes to her heart failure medications at this time.  Plan follow-up in 1 year or sooner as necessary.  Chrystie Nose, MD, [facility], FACP  [place]  [facility]  Medical Director of the [facility]  [facility] Diplomate of the American Board of Clinical Lipidology Attending Cardiologist  Direct Dial: [phone]  Fax: [phone]  Website:  [email or web address] Nicely Karmyn Lowman 07/29/2023, 11:19 AM

## 2023-08-11 ENCOUNTER — Ambulatory Visit: Payer: Medicare PPO | Admitting: Family Medicine

## 2023-08-11 ENCOUNTER — Encounter: Payer: Self-pay | Admitting: Family Medicine

## 2023-08-11 VITALS — BP 132/78 | HR 93 | Temp 98.0°F | Resp 16 | Ht 66.0 in | Wt 241.0 lb

## 2023-08-11 DIAGNOSIS — E1165 Type 2 diabetes mellitus with hyperglycemia: Secondary | ICD-10-CM

## 2023-08-11 DIAGNOSIS — E785 Hyperlipidemia, unspecified: Secondary | ICD-10-CM | POA: Diagnosis not present

## 2023-08-11 DIAGNOSIS — M810 Age-related osteoporosis without current pathological fracture: Secondary | ICD-10-CM

## 2023-08-11 DIAGNOSIS — M1A0720 Idiopathic chronic gout, left ankle and foot, without tophus (tophi): Secondary | ICD-10-CM | POA: Diagnosis not present

## 2023-08-11 DIAGNOSIS — Z7984 Long term (current) use of oral hypoglycemic drugs: Secondary | ICD-10-CM

## 2023-08-11 LAB — COMPREHENSIVE METABOLIC PANEL
ALT: 10 U/L (ref 0–35)
AST: 9 U/L (ref 0–37)
Albumin: 3.7 g/dL (ref 3.5–5.2)
Alkaline Phosphatase: 122 U/L — ABNORMAL HIGH (ref 39–117)
BUN: 14 mg/dL (ref 6–23)
CO2: 26 meq/L (ref 19–32)
Calcium: 8.7 mg/dL (ref 8.4–10.5)
Chloride: 106 meq/L (ref 96–112)
Creatinine, Ser: 0.88 mg/dL (ref 0.40–1.20)
GFR: 66.53 mL/min (ref >60.00)
Glucose, Bld: 118 mg/dL — ABNORMAL HIGH (ref 70–99)
Potassium: 3.9 meq/L (ref 3.5–5.1)
Sodium: 143 meq/L (ref 135–145)
Total Bilirubin: 0.3 mg/dL (ref 0.2–1.2)
Total Protein: 7.2 g/dL (ref 6.0–8.3)

## 2023-08-11 LAB — LIPID PANEL
Cholesterol: 104 mg/dL (ref 0–200)
HDL: 35.9 mg/dL — ABNORMAL LOW (ref >39.00)
LDL Cholesterol: 51 mg/dL (ref 0–99)
NonHDL: 67.86
Total CHOL/HDL Ratio: 3
Triglycerides: 83 mg/dL (ref 0.0–149.0)
VLDL: 16.6 mg/dL (ref 0.0–40.0)

## 2023-08-11 LAB — URIC ACID: Uric Acid, Serum: 4.9 mg/dL (ref 2.4–7.0)

## 2023-08-11 LAB — VITAMIN D 25 HYDROXY (VIT D DEFICIENCY, FRACTURES): VITD: 43.82 ng/mL (ref 30.00–100.00)

## 2023-08-11 LAB — HEMOGLOBIN A1C: Hgb A1c MFr Bld: 6.4 % (ref 4.6–6.5)

## 2023-08-11 NOTE — Patient Instructions (Signed)
Give us 2-3 business days to get the results of your labs back.   Keep the diet clean and stay active.  Let us know if you need anything. 

## 2023-08-11 NOTE — Progress Notes (Signed)
Subjective:   Chief Complaint  Patient presents with   Follow-up    Follow up    Sharon Yu is a 72 y.o. female here for follow-up of diabetes.   Steve does not routinely check her sugars.  Patient does not require insulin.   Medications include: Metformin 500 mg/d  Diet is OK.  Exercise: none  Hyperlipidemia Patient presents for dyslipidemia follow up. Currently being treated with Lipitor 40 mg/d and compliance with treatment thus far has been good. She denies myalgias. Diet/exercise. No CP or SOB.  The patient is not known to have coexisting coronary artery disease.  Osteoporosis Taking Fosamax 70 mg/week. Reports compliance, no AE's. No current exercise as she recently had back surgery. She does take supp Vit D and Ca.   Gout Had 2 mild gout flares of her L foot and R toe since the last time we saw each other. She is now taking allopurinol 100 mg/d. Compliant, no AE's. Red meat flares her s/s's.   Past Medical History:  Diagnosis Date   Arthritis    Back pain    buldging disc   CHF (congestive heart failure) (HCC)    Chronic back pain    Diabetes mellitus without complication (HCC)    Hyperlipidemia    takes Crestor daily   Hypertension    takes Maxzide and Metoprolol daily along with Ramipril   Hypopotassemia    takes Potassium pill daily   Joint pain    Joint swelling    Nonischemic cardiomyopathy (HCC)    a. 07/2017: EF reduced to 25-30%, cath showing normal cors.    Shortness of breath    with exertion     Related testing: Retinal exam: Done Pneumovax: done  Objective:  BP 132/78 (BP Location: Left Arm, Patient Position: Sitting, Cuff Size: Normal)   Pulse 93   Temp 98 F (36.7 C) (Oral)   Resp 16   Ht 5\' 6"  (1.676 m)   Wt 241 lb (109.3 kg)   SpO2 95%   BMI 38.90 kg/m  General:  Well developed, well nourished, in no apparent distress Skin:  Warm, no pallor or diaphoresis Head:  Normocephalic, atraumatic Eyes:  Pupils equal and round,  sclera anicteric without injection  Lungs:  CTAB, no access msc use Cardio:  RRR, no bruits, no LE edema Musculoskeletal:  Symmetrical muscle groups noted without atrophy or deformity Neuro:  Sensation intact to pinprick on feet Psych: Age appropriate judgment and insight  Assessment:   Type 2 diabetes mellitus with hyperglycemia, without long-term current use of insulin (HCC) - Plan: Comprehensive metabolic panel, Lipid panel, Hemoglobin A1c  Hyperlipidemia LDL goal <100  Age-related osteoporosis without current pathological fracture - Plan: VITAMIN D 25 Hydroxy (Vit-D Deficiency, Fractures)  Chronic gout of left foot, unspecified cause - Plan: Uric acid   Plan:   Chronic, stable. Cont metformin 500 mg/d- may replace with Ozempic/Mounjaro. Counseled on diet and exercise. Chronic, stable. Cont Lipitor 40 mg/d.  Chronic stable. Cont Fosamax. Will start exercising again as she continues to improve with her back.  Chronic stable. Cont allopurinol 100 mg/d. Avoid high-purine foods.  F/u in 6 mo. The patient voiced understanding and agreement to the plan.  Jilda Roche Bertha, DO 08/11/23 10:44 AM

## 2023-08-12 ENCOUNTER — Other Ambulatory Visit: Payer: Self-pay

## 2023-08-12 DIAGNOSIS — E1165 Type 2 diabetes mellitus with hyperglycemia: Secondary | ICD-10-CM

## 2023-09-01 ENCOUNTER — Other Ambulatory Visit (HOSPITAL_COMMUNITY): Payer: Medicare PPO

## 2023-09-01 DIAGNOSIS — M545 Low back pain, unspecified: Secondary | ICD-10-CM | POA: Diagnosis not present

## 2023-09-04 ENCOUNTER — Other Ambulatory Visit (HOSPITAL_COMMUNITY): Payer: Medicare PPO

## 2023-09-10 ENCOUNTER — Ambulatory Visit (HOSPITAL_COMMUNITY): Payer: Medicare PPO | Attending: Internal Medicine

## 2023-09-10 DIAGNOSIS — I428 Other cardiomyopathies: Secondary | ICD-10-CM | POA: Diagnosis not present

## 2023-09-10 LAB — ECHOCARDIOGRAM COMPLETE
Area-P 1/2: 3.72 cm2
P 1/2 time: 550 ms
S' Lateral: 3.7 cm

## 2023-09-10 MED ORDER — PERFLUTREN LIPID MICROSPHERE
1.0000 mL | INTRAVENOUS | Status: AC | PRN
Start: 2023-09-10 — End: 2023-09-10
  Administered 2023-09-10: 2 mL via INTRAVENOUS

## 2023-09-11 ENCOUNTER — Ambulatory Visit: Payer: Medicare PPO

## 2023-09-11 ENCOUNTER — Encounter: Payer: Self-pay | Admitting: Internal Medicine

## 2023-09-17 ENCOUNTER — Other Ambulatory Visit: Payer: Self-pay

## 2023-09-17 ENCOUNTER — Other Ambulatory Visit: Payer: Self-pay | Admitting: Family Medicine

## 2023-09-17 ENCOUNTER — Ambulatory Visit: Payer: Medicare PPO | Attending: Orthopedic Surgery | Admitting: Physical Therapy

## 2023-09-17 ENCOUNTER — Encounter: Payer: Self-pay | Admitting: Physical Therapy

## 2023-09-17 DIAGNOSIS — M6281 Muscle weakness (generalized): Secondary | ICD-10-CM | POA: Insufficient documentation

## 2023-09-17 DIAGNOSIS — M5459 Other low back pain: Secondary | ICD-10-CM | POA: Insufficient documentation

## 2023-09-17 DIAGNOSIS — R2689 Other abnormalities of gait and mobility: Secondary | ICD-10-CM | POA: Diagnosis not present

## 2023-09-17 DIAGNOSIS — R2681 Unsteadiness on feet: Secondary | ICD-10-CM | POA: Insufficient documentation

## 2023-09-17 NOTE — Therapy (Signed)
 OUTPATIENT PHYSICAL THERAPY THORACOLUMBAR EVALUATION   Patient Name: Sharon Yu MRN: [id number] DOB:[birth year], 73 y.o., female Today's Date: 09/17/2023  END OF SESSION:  PT End of Session - 09/17/23 1106     Visit Number 1    Number of Visits 17    Date for PT Re-Evaluation 11/12/23    Authorization Type [employer] MCR    Authorization Time Period 09/17/23 to 11/12/23    Progress Note Due on Visit 10    PT Start Time 1103    PT Stop Time 1143    PT Time Calculation (min) 40 min    Activity Tolerance Patient tolerated treatment well    Behavior During Therapy [facility] for tasks assessed/performed             Past Medical History:  Diagnosis Date   Arthritis    Back pain    buldging disc   CHF (congestive heart failure) (HCC)    Chronic back pain    Diabetes mellitus without complication (HCC)    Hyperlipidemia    takes Crestor daily   Hypertension    takes Maxzide  and Metoprolol  daily along with Ramipril    Hypopotassemia    takes Potassium pill daily   Joint pain    Joint swelling    Nonischemic cardiomyopathy (HCC)    a. 07/2017: EF reduced to 25-30%, cath showing normal cors.    Shortness of breath    with exertion   Past Surgical History:  Procedure Laterality Date   bilateral knee arthroscopies     carpel tunnel     bilateral   CHOLECYSTECTOMY     colonscopy     JOINT REPLACEMENT Left    RIGHT/LEFT HEART CATH AND CORONARY ANGIOGRAPHY N/A 07/17/2017   Procedure: RIGHT/LEFT HEART CATH AND CORONARY ANGIOGRAPHY;  Surgeon: Swaziland, Peter M, MD;  Location: [facility] INVASIVE CV LAB;  Service: Cardiovascular;  Laterality: N/A;   TOTAL KNEE ARTHROPLASTY Right 02/19/2013   Procedure: TOTAL KNEE ARTHROPLASTY;  Surgeon: Forbes Ida., MD;  Location: [facility] OR;  Service: Orthopedics;  Laterality: Right;  right total knee arthroplasty   TOTAL KNEE REVISION Left 07/02/2013   Procedure: LEFT TOTAL KNEE TIBIA REVISION;  Surgeon: Ilean Mall, MD;  Location: [facility] OR;  Service:  Orthopedics;  Laterality: Left;   TRANSFORAMINAL LUMBAR INTERBODY FUSION (TLIF) WITH PEDICLE SCREW FIXATION 2 LEVEL Right 06/04/2023   Procedure: RIGHT-SIDED LUMBAR THREE- LUMBAR FOUR, LUMBAR FOUR- LUMBAR FIVE TRANSFORAMINAL LUMBAR INTERBODY FUSION AND DECOMPRESSION WITH INSTRUMENTATION AND ALLOGRAFT;  Surgeon: Virl Grimes, MD;  Location: [facility] OR;  Service: Orthopedics;  Laterality: Right;   Patient Active Problem List   Diagnosis Date Noted   Chronic gout of left foot 08/11/2023   Radiculopathy of lumbar region 06/04/2023   Age-related osteoporosis without current pathological fracture 08/03/2021   Chronic bilateral low back pain without sciatica 07/19/2020   Type 2 diabetes mellitus with obesity (HCC) 10/15/2018   Nonischemic cardiomyopathy (HCC) 07/31/2017   Type 2 diabetes mellitus with hyperglycemia, without long-term current use of insulin (HCC)    Morbid (severe) obesity due to excess calories (HCC)    Elevated troponin 07/15/2017   Chronic combined systolic and diastolic CHF (congestive heart failure) (HCC) 07/15/2017   Ascending aortic aneurysm (HCC)    CHF exacerbation (HCC) 07/14/2017   Acute posthemorrhagic anemia 08/11/2013   Hypokalemia 07/25/2013   Essential hypertension 07/25/2013   Hyperlipidemia LDL goal <100 07/25/2013   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) 07/02/2013  PCP: Jobe Mulder DO   REFERRING PROVIDER: Virl Grimes, MD  REFERRING DIAG:  Diagnosis  Z98.1 (ICD-10-CM) - S/P lumbar fusion    Rationale for Evaluation and Treatment: Rehabilitation  THERAPY DIAG:  Other low back pain  Muscle weakness (generalized)  Unsteadiness on feet  Other abnormalities of gait and mobility  ONSET DATE: L3-L5 transforaminal lumbar interbody fusion and decompression and allograft 06/04/23  SUBJECTIVE:                                                                                                                                                                                            SUBJECTIVE STATEMENT:  Had my surgery back in October. Things are coming along slowly but surely, using my cane now instead of the walker. Feeling a little weak when I first stand up but once I get moving I feel OK. Surgeon took away my restrictions at this point, OK to bend/lift/twist but he wants me staying with light weights, said "if it hurts don't do it". If I'm cooking dinner and standing for a long time, I get a catch on my left side but the doctor said this is likely from OA.   PERTINENT HISTORY:  See above   PAIN:  Are you having pain? Yes: NPRS scale: 4/10 now, can get to 7-8/10 at worst  Pain location: L side of low back  Pain description: catching feeling  Aggravating factors: standing for a long time, morning stiffness  Relieving factors: movement, heat   PRECAUTIONS: Fall and Other: no heavy lifting, otherwise clear of BLT/post op precautions   RED FLAGS: None   WEIGHT BEARING RESTRICTIONS: No  FALLS:  Has patient fallen in last 6 months? No and FOF (-)  LIVING ENVIRONMENT: Lives with: lives with their family son is there 24/7 for now, visiting from [place]   Lives in: House/apartment Stairs: 4 STE B rails, otherwise one level home  Has following equipment at home: Single point cane and Environmental consultant - 2 wheeled  OCCUPATION: retired- used to work in [employer] schools   PLOF: Independent, Independent with basic ADLs, Independent with gait, and Independent with transfers  PATIENT GOALS: build strength, be able to stand longer for cooking/cleaning/housework  NEXT MD VISIT: Referring 10/17/23  OBJECTIVE:  Note: Objective measures were completed at Evaluation unless otherwise noted.    PATIENT SURVEYS:  Modified Oswestry 38%   COGNITION: Overall cognitive status: Within functional limits for tasks assessed       MUSCLE LENGTH:  HS WNL B Piriformis moderate limitation B Hip flexors moderately  tight per skilled observation  POSTURE: rounded shoulders, forward head, decreased lumbar  lordosis, decreased thoracic kyphosis, and s/p lumbar surgery     LUMBAR ROM:   AROM eval  Flexion Moderate limitation   Extension Extreme limitation   Right lateral flexion Severe limitation  Left lateral flexion Severe limitation   Right rotation   Left rotation    (Blank rows = not tested)    LOWER EXTREMITY MMT:    MMT Right eval Left eval  Hip flexion 4- 4-  Hip extension    Hip abduction 3+ 3  Hip adduction    Hip internal rotation    Hip external rotation    Knee flexion 4- 4+  Knee extension 4 5  Ankle dorsiflexion 5 5  Ankle plantarflexion    Ankle inversion    Ankle eversion     (Blank rows = not tested)    FUNCTIONAL TESTS:  5 times sit to stand: 21.6 seconds no UEs  Timed up and go (TUG): 14.4 seconds no device  3 minute walk test: 344 no device, 2 seated rest breaks   GAIT: Distance walked: 32ft Assistive device utilized: None Level of assistance: SBA Comments: hip ER tightness noted, flexed at hips, slightly wide BOS, needed one time MinA for balance when turning   TREATMENT DATE:   09/17/23  Eval, care planning, HEP  Seated march + TA set into red TB x10 Seated hip ABD red TB x10 STS with red TB above knees x5                                                                                                                                 PATIENT EDUCATION:  Education details: exam findings, POC, HEP  Person educated: Patient Education method: Explanation Education comprehension: verbalized understanding, returned demonstration, and needs further education  HOME EXERCISE PROGRAM: Access Code: [facility] URL: [email or web address] Date: 09/17/2023 Prepared by: Terrel Ferries  Exercises - Seated March with Resistance  - 1 x daily - 7 x weekly - 1 sets - 20 reps - Seated Hip Abduction with Resistance  - 1 x daily - 7 x weekly - 1  sets - 10 reps - 1 second  hold - Sit to Stand with Resistance Around Legs  - 1 x daily - 7 x weekly - 2 sets - 5 reps  ASSESSMENT:  CLINICAL IMPRESSION: Patient is a 73 y.o. F who was seen today for physical therapy evaluation and treatment for  Diagnosis  Z98.1 (ICD-10-CM) - S/P lumbar fusion  . Of note she reports she has been cleared of her lumbar precautions, but does still need to be cautious in terms of lifting heavy weights. Exam/objective measures as above, she generally demonstrates significant impairment in functional strength, balance, and functional activity tolerance. Will really benefit from skilled PT services to address all findings and assist her in reaching optimal level of function post-op.   OBJECTIVE IMPAIRMENTS: Abnormal gait, decreased activity tolerance, decreased balance, decreased mobility, difficulty walking, decreased strength, postural dysfunction,  obesity, and pain.   ACTIVITY LIMITATIONS: carrying, lifting, sitting, standing, sleeping, transfers, bed mobility, and locomotion level  PARTICIPATION LIMITATIONS: meal prep, cleaning, laundry, driving, shopping, community activity, and church  PERSONAL FACTORS: Age, Behavior pattern, Fitness, Past/current experiences, Social background, and Time since onset of injury/illness/exacerbation are also affecting patient's functional outcome.   REHAB POTENTIAL: Good  CLINICAL DECISION MAKING: Stable/uncomplicated  EVALUATION COMPLEXITY: Low   GOALS: Goals reviewed with patient? No  SHORT TERM GOALS: Target date: 10/15/2023    Will be compliant with appropriate progressive HEP  Baseline: Goal status: INITIAL  2.  Lumbar flexion and lateral flexion AROM to be no more than 25% limited, lumbar extension to be no more than 50% limited  Baseline:  Goal status: INITIAL  3.  Will demonstrate better awareness of functional posture with all task performance  Baseline:  Goal status: INITIAL  4.  Mm flexibility  impairment to have improved by 50%  Baseline:  Goal status: INITIAL    LONG TERM GOALS: Target date: 11/12/2023    MMT to have improved by at least one grade in all weak groups  Baseline:  Goal status: INITIAL  2.  Will ambulate at least 595ft in no device to show improved mobility and community access  Baseline:  Goal status: INITIAL  3.  Will complete 5xSTS in 15 seconds or less to show improved BLE strength and mobility  Baseline:  Goal status: INITIAL  4.  Will score at least 48 on Berg to show improved functional balance/reduced fall risk  Baseline:  Goal status: INITIAL  5.  Will tolerate standing for periods of at least 90 minutes in duration to improve ability to tolerate and perform home and self care  Baseline:  Goal status: INITIAL  6.  Oswestry to have improved by at least 15 points to show improved subjective QOL  Baseline:  Goal status: INITIAL  PLAN:  PT FREQUENCY: 2x/week  PT DURATION: 8 weeks  PLANNED INTERVENTIONS: 97164- PT Re-evaluation, 97110-Therapeutic exercises, 97530- Therapeutic activity, [id number]- Neuromuscular re-education, 97535- Self Care, 16109- Manual therapy, [id number]- Gait training, [id number]- Aquatic Therapy, Cryotherapy, and Moist heat.  PLAN FOR NEXT SESSION: clear of post-op precautions per patient; focus on strength, balance, general conditioning. Need to capture Berg, TUG did not fully show extent of balance impairments   Terrel Ferries, PT, DPT 09/17/23 12:00 PM

## 2023-09-22 ENCOUNTER — Other Ambulatory Visit (INDEPENDENT_AMBULATORY_CARE_PROVIDER_SITE_OTHER): Payer: Medicare PPO

## 2023-09-22 ENCOUNTER — Ambulatory Visit: Payer: Medicare PPO

## 2023-09-22 DIAGNOSIS — R2689 Other abnormalities of gait and mobility: Secondary | ICD-10-CM

## 2023-09-22 DIAGNOSIS — M5459 Other low back pain: Secondary | ICD-10-CM | POA: Diagnosis not present

## 2023-09-22 DIAGNOSIS — R2681 Unsteadiness on feet: Secondary | ICD-10-CM

## 2023-09-22 DIAGNOSIS — E1165 Type 2 diabetes mellitus with hyperglycemia: Secondary | ICD-10-CM

## 2023-09-22 DIAGNOSIS — M6281 Muscle weakness (generalized): Secondary | ICD-10-CM

## 2023-09-22 LAB — BASIC METABOLIC PANEL
BUN: 15 mg/dL (ref 6–23)
CO2: 26 meq/L (ref 19–32)
Calcium: 9 mg/dL (ref 8.4–10.5)
Chloride: 107 meq/L (ref 96–112)
Creatinine, Ser: 0.78 mg/dL (ref 0.40–1.20)
GFR: 76.83 mL/min (ref >60.00)
Glucose, Bld: 113 mg/dL — ABNORMAL HIGH (ref 70–99)
Potassium: 3.9 meq/L (ref 3.5–5.1)
Sodium: 143 meq/L (ref 135–145)

## 2023-09-22 NOTE — Therapy (Signed)
OUTPATIENT PHYSICAL THERAPY TREATMENT   Patient Name: Sharon Yu MRN: [id number] DOB:1950/12/17, 73 y.o., female Today's Date: 09/22/2023  END OF SESSION:  PT End of Session - 09/22/23 1059     Visit Number 2    Number of Visits 17    Date for PT Re-Evaluation 11/12/23    Authorization Type [employer] MCR    Authorization Time Period 09/17/23 to 11/12/23    Progress Note Due on Visit 10    PT Start Time 1015    PT Stop Time 1058    PT Time Calculation (min) 43 min    Activity Tolerance Patient tolerated treatment well    Behavior During Therapy WFL for tasks assessed/performed              Past Medical History:  Diagnosis Date   Arthritis    Back pain    buldging disc   CHF (congestive heart failure) (HCC)    Chronic back pain    Diabetes mellitus without complication (HCC)    Hyperlipidemia    takes Crestor daily   Hypertension    takes Maxzide and Metoprolol daily along with Ramipril   Hypopotassemia    takes Potassium pill daily   Joint pain    Joint swelling    Nonischemic cardiomyopathy (HCC)    a. 07/2017: EF reduced to 25-30%, cath showing normal cors.    Shortness of breath    with exertion   Past Surgical History:  Procedure Laterality Date   bilateral knee arthroscopies     carpel tunnel     bilateral   CHOLECYSTECTOMY     colonscopy     JOINT REPLACEMENT Left    RIGHT/LEFT HEART CATH AND CORONARY ANGIOGRAPHY N/A 07/17/2017   Procedure: RIGHT/LEFT HEART CATH AND CORONARY ANGIOGRAPHY;  Surgeon: Swaziland, Peter M, MD;  Location: [facility] INVASIVE CV LAB;  Service: Cardiovascular;  Laterality: N/A;   TOTAL KNEE ARTHROPLASTY Right 02/19/2013   Procedure: TOTAL KNEE ARTHROPLASTY;  Surgeon: Thera Flake., MD;  Location: [facility] OR;  Service: Orthopedics;  Laterality: Right;  right total knee arthroplasty   TOTAL KNEE REVISION Left 07/02/2013   Procedure: LEFT TOTAL KNEE TIBIA REVISION;  Surgeon: Nestor Lewandowsky, MD;  Location: [facility] OR;  Service: Orthopedics;   Laterality: Left;   TRANSFORAMINAL LUMBAR INTERBODY FUSION (TLIF) WITH PEDICLE SCREW FIXATION 2 LEVEL Right 06/04/2023   Procedure: RIGHT-SIDED LUMBAR THREE- LUMBAR FOUR, LUMBAR FOUR- LUMBAR FIVE TRANSFORAMINAL LUMBAR INTERBODY FUSION AND DECOMPRESSION WITH INSTRUMENTATION AND ALLOGRAFT;  Surgeon: Estill Bamberg, MD;  Location: [facility] OR;  Service: Orthopedics;  Laterality: Right;   Patient Active Problem List   Diagnosis Date Noted   Chronic gout of left foot 08/11/2023   Radiculopathy of lumbar region 06/04/2023   Age-related osteoporosis without current pathological fracture 08/03/2021   Chronic bilateral low back pain without sciatica 07/19/2020   Type 2 diabetes mellitus with obesity (HCC) 10/15/2018   Nonischemic cardiomyopathy (HCC) 07/31/2017   Type 2 diabetes mellitus with hyperglycemia, without long-term current use of insulin (HCC)    Morbid (severe) obesity due to excess calories (HCC)    Elevated troponin 07/15/2017   Chronic combined systolic and diastolic CHF (congestive heart failure) (HCC) 07/15/2017   Ascending aortic aneurysm (HCC)    CHF exacerbation (HCC) 07/14/2017   Acute posthemorrhagic anemia 08/11/2013   Hypokalemia 07/25/2013   Essential hypertension 07/25/2013   Hyperlipidemia LDL goal <100 07/25/2013   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) 07/02/2013  PCP: [name] DO   REFERRING PROVIDER: Estill Bamberg, MD  REFERRING DIAG:  Diagnosis  Z98.1 (ICD-10-CM) - S/P lumbar fusion    Rationale for Evaluation and Treatment: Rehabilitation  THERAPY DIAG:  Other low back pain  Muscle weakness (generalized)  Unsteadiness on feet  Other abnormalities of gait and mobility  ONSET DATE: L3-L5 transforaminal lumbar interbody fusion and decompression and allograft 06/04/23  SUBJECTIVE:                                                                                                                                                                                            SUBJECTIVE STATEMENT: Pt reports a little irritation in her back, not too bad.    PERTINENT HISTORY:  See above   PAIN:  Are you having pain? Yes: NPRS scale: 5/10 now, can get to 7-8/10 at worst  Pain location: L side of low back  Pain description: catching feeling  Aggravating factors: standing for a long time, morning stiffness  Relieving factors: movement, heat   PRECAUTIONS: Fall and Other: no heavy lifting, otherwise clear of BLT/post op precautions   RED FLAGS: None   WEIGHT BEARING RESTRICTIONS: No  FALLS:  Has patient fallen in last 6 months? No and FOF (-)  LIVING ENVIRONMENT: Lives with: lives with their family son is there 24/7 for now, visiting from [place]  Lives in: House/apartment Stairs: 4 STE B rails, otherwise one level home  Has following equipment at home: Single point cane and Environmental consultant - 2 wheeled  OCCUPATION: retired- used to work in [employer] schools   PLOF: Independent, Independent with basic ADLs, Independent with gait, and Independent with transfers  PATIENT GOALS: build strength, be able to stand longer for cooking/cleaning/housework  NEXT MD VISIT: Referring 10/17/23  OBJECTIVE:  Note: Objective measures were completed at Evaluation unless otherwise noted.    PATIENT SURVEYS:  Modified Oswestry 38%   COGNITION: Overall cognitive status: Within functional limits for tasks assessed       MUSCLE LENGTH:  HS WNL B Piriformis moderate limitation B Hip flexors moderately tight per skilled observation  POSTURE: rounded shoulders, forward head, decreased lumbar lordosis, decreased thoracic kyphosis, and s/p lumbar surgery     LUMBAR ROM:   AROM eval  Flexion Moderate limitation   Extension Extreme limitation   Right lateral flexion Severe limitation  Left lateral flexion Severe limitation   Right rotation   Left rotation    (Blank rows = not tested)    LOWER EXTREMITY  MMT:    MMT Right eval Left eval  Hip flexion 4- 4-  Hip extension  Hip abduction 3+ 3  Hip adduction    Hip internal rotation    Hip external rotation    Knee flexion 4- 4+  Knee extension 4 5  Ankle dorsiflexion 5 5  Ankle plantarflexion    Ankle inversion    Ankle eversion     (Blank rows = not tested)    FUNCTIONAL TESTS:  5 times sit to stand: 21.6 seconds no UEs  Timed up and go (TUG): 14.4 seconds no device  3 minute walk test: 344 no device, 2 seated rest breaks   GAIT: Distance walked: 341ft Assistive device utilized: None Level of assistance: SBA Comments: hip ER tightness noted, flexed at hips, slightly wide BOS, needed one time MinA for balance when turning   TREATMENT DATE:  09/22/23 Nustep L3x10min UE/LE BERG: 53/56 Standing heel raises x 10 B Standing hip abduction x 10 B Standing hip extension x 10 B Standing marches x 10 B Standing HS curls x 10  Church pew rock ant/post x 10 Seated marching RTB x 10   09/17/23  Eval, care planning, HEP  Seated march + TA set into red TB x10 Seated hip ABD red TB x10 STS with red TB above knees x5                                                                                                                                 PATIENT EDUCATION:  Education details: exam findings, POC, HEP  Person educated: Patient Education method: Explanation Education comprehension: verbalized understanding, returned demonstration, and needs further education  HOME EXERCISE PROGRAM: Access Code: [facility] URL: [email or web address] Date: 09/22/2023 Prepared by: Verta Ellen  Exercises - Seated March with Resistance  - 1 x daily - 7 x weekly - 1 sets - 20 reps - Seated Hip Abduction with Resistance  - 1 x daily - 7 x weekly - 1 sets - 10 reps - 1 second  hold - Sit to Stand with Resistance Around Legs  - 1 x daily - 7 x weekly - 2 sets - 5 reps - Standing Hip Abduction with Counter Support  - 1 x daily  - 7 x weekly - 2 sets - 10 reps - Standing Hip Extension with Counter Support  - 1 x daily - 7 x weekly - 2 sets - 10 reps - Standing March with Counter Support  - 1 x daily - 7 x weekly - 2 sets - 10 reps  ASSESSMENT:  CLINICAL IMPRESSION: Pt scored very high on BERG balance not indicating a fall risk. She did require a few seated breaks with the test, so she needs more work on standing exercises to build standing tolerance. Updated HEP for standing exercise along with seated ones when she becomes fatigued.   OBJECTIVE IMPAIRMENTS: Abnormal gait, decreased activity tolerance, decreased balance, decreased mobility, difficulty walking, decreased strength, postural dysfunction, obesity, and pain.   ACTIVITY LIMITATIONS: carrying, lifting, sitting, standing, sleeping, transfers,  bed mobility, and locomotion level  PARTICIPATION LIMITATIONS: meal prep, cleaning, laundry, driving, shopping, community activity, and church  PERSONAL FACTORS: Age, Behavior pattern, Fitness, Past/current experiences, Social background, and Time since onset of injury/illness/exacerbation are also affecting patient's functional outcome.   REHAB POTENTIAL: Good  CLINICAL DECISION MAKING: Stable/uncomplicated  EVALUATION COMPLEXITY: Low   GOALS: Goals reviewed with patient? No  SHORT TERM GOALS: Target date: 10/15/2023    Will be compliant with appropriate progressive HEP  Baseline: Goal status: INITIAL  2.  Lumbar flexion and lateral flexion AROM to be no more than 25% limited, lumbar extension to be no more than 50% limited  Baseline:  Goal status: INITIAL  3.  Will demonstrate better awareness of functional posture with all task performance  Baseline:  Goal status: INITIAL  4.  Mm flexibility impairment to have improved by 50%  Baseline:  Goal status: INITIAL    LONG TERM GOALS: Target date: 11/12/2023    MMT to have improved by at least one grade in all weak groups  Baseline:  Goal status:  INITIAL  2.  Will ambulate at least 57ft in no device to show improved mobility and community access  Baseline:  Goal status: INITIAL  3.  Will complete 5xSTS in 15 seconds or less to show improved BLE strength and mobility  Baseline:  Goal status: INITIAL  4.  Will score at least 48 on Berg to show improved functional balance/reduced fall risk  Baseline:  Goal status: INITIAL  5.  Will tolerate standing for periods of at least 90 minutes in duration to improve ability to tolerate and perform home and self care  Baseline:  Goal status: INITIAL  6.  Oswestry to have improved by at least 15 points to show improved subjective QOL  Baseline:  Goal status: INITIAL  PLAN:  PT FREQUENCY: 2x/week  PT DURATION: 8 weeks  PLANNED INTERVENTIONS: 97164- PT Re-evaluation, 97110-Therapeutic exercises, 97530- Therapeutic activity, [id number]- Neuromuscular re-education, 97535- Self Care, 16109- Manual therapy, [id number]- Gait training, [id number]- Aquatic Therapy, Cryotherapy, and Moist heat.  PLAN FOR NEXT SESSION: clear of post-op precautions per patient; focus on strength, balance, general conditioning.  Darleene Cleaver, PTA  09/22/23 10:59 AM

## 2023-09-26 ENCOUNTER — Telehealth: Payer: Self-pay

## 2023-09-26 NOTE — Telephone Encounter (Signed)
Copied from CRM [id number]. Topic: Clinical - Lab/Test Results >> Sep 26, 2023  8:07 AM Fredrich Romans wrote: Reason for CRM: patient called in to ask about lab results from labs that were drawn on 09/22/2023

## 2023-09-26 NOTE — Telephone Encounter (Signed)
Called pt told her the message had been sent to Dr.Wendling and we will call her back to let her know what he say.

## 2023-09-26 NOTE — Telephone Encounter (Signed)
Called pt was advised labs normal and pt stated understand.

## 2023-09-29 ENCOUNTER — Ambulatory Visit: Payer: Medicare PPO

## 2023-09-29 DIAGNOSIS — M5459 Other low back pain: Secondary | ICD-10-CM

## 2023-09-29 DIAGNOSIS — M6281 Muscle weakness (generalized): Secondary | ICD-10-CM | POA: Diagnosis not present

## 2023-09-29 DIAGNOSIS — R2681 Unsteadiness on feet: Secondary | ICD-10-CM | POA: Diagnosis not present

## 2023-09-29 DIAGNOSIS — R2689 Other abnormalities of gait and mobility: Secondary | ICD-10-CM | POA: Diagnosis not present

## 2023-09-29 NOTE — Therapy (Signed)
OUTPATIENT PHYSICAL THERAPY TREATMENT   Patient Name: Sharon Yu MRN: [id number] DOB:1951-08-09, 73 y.o., female Today's Date: 09/29/2023  END OF SESSION:  PT End of Session - 09/29/23 1235     Visit Number 3    Number of Visits 17    Date for PT Re-Evaluation 11/12/23    Authorization Type [employer] MCR    Authorization Time Period 09/17/23 to 11/12/23    Progress Note Due on Visit 10    PT Start Time 1150    PT Stop Time 1230    PT Time Calculation (min) 40 min    Activity Tolerance Patient tolerated treatment well    Behavior During Therapy [facility] for tasks assessed/performed               Past Medical History:  Diagnosis Date   Arthritis    Back pain    buldging disc   CHF (congestive heart failure) (HCC)    Chronic back pain    Diabetes mellitus without complication (HCC)    Hyperlipidemia    takes Crestor daily   Hypertension    takes Maxzide and Metoprolol daily along with Ramipril   Hypopotassemia    takes Potassium pill daily   Joint pain    Joint swelling    Nonischemic cardiomyopathy (HCC)    a. 07/2017: EF reduced to 25-30%, cath showing normal cors.    Shortness of breath    with exertion   Past Surgical History:  Procedure Laterality Date   bilateral knee arthroscopies     carpel tunnel     bilateral   CHOLECYSTECTOMY     colonscopy     JOINT REPLACEMENT Left    RIGHT/LEFT HEART CATH AND CORONARY ANGIOGRAPHY N/A 07/17/2017   Procedure: RIGHT/LEFT HEART CATH AND CORONARY ANGIOGRAPHY;  Surgeon: Swaziland, Peter M, MD;  Location: [facility] INVASIVE CV LAB;  Service: Cardiovascular;  Laterality: N/A;   TOTAL KNEE ARTHROPLASTY Right 02/19/2013   Procedure: TOTAL KNEE ARTHROPLASTY;  Surgeon: Thera Flake., MD;  Location: [facility] OR;  Service: Orthopedics;  Laterality: Right;  right total knee arthroplasty   TOTAL KNEE REVISION Left 07/02/2013   Procedure: LEFT TOTAL KNEE TIBIA REVISION;  Surgeon: Nestor Lewandowsky, MD;  Location: [facility] OR;  Service: Orthopedics;   Laterality: Left;   TRANSFORAMINAL LUMBAR INTERBODY FUSION (TLIF) WITH PEDICLE SCREW FIXATION 2 LEVEL Right 06/04/2023   Procedure: RIGHT-SIDED LUMBAR THREE- LUMBAR FOUR, LUMBAR FOUR- LUMBAR FIVE TRANSFORAMINAL LUMBAR INTERBODY FUSION AND DECOMPRESSION WITH INSTRUMENTATION AND ALLOGRAFT;  Surgeon: Estill Bamberg, MD;  Location: [facility] OR;  Service: Orthopedics;  Laterality: Right;   Patient Active Problem List   Diagnosis Date Noted   Chronic gout of left foot 08/11/2023   Radiculopathy of lumbar region 06/04/2023   Age-related osteoporosis without current pathological fracture 08/03/2021   Chronic bilateral low back pain without sciatica 07/19/2020   Type 2 diabetes mellitus with obesity (HCC) 10/15/2018   Nonischemic cardiomyopathy (HCC) 07/31/2017   Type 2 diabetes mellitus with hyperglycemia, without long-term current use of insulin (HCC)    Morbid (severe) obesity due to excess calories (HCC)    Elevated troponin 07/15/2017   Chronic combined systolic and diastolic CHF (congestive heart failure) (HCC) 07/15/2017   Ascending aortic aneurysm (HCC)    CHF exacerbation (HCC) 07/14/2017   Acute posthemorrhagic anemia 08/11/2013   Hypokalemia 07/25/2013   Essential hypertension 07/25/2013   Hyperlipidemia LDL goal <100 07/25/2013   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) 07/02/2013  PCP: [name] DO   REFERRING PROVIDER: Estill Bamberg, MD  REFERRING DIAG:  Diagnosis  Z98.1 (ICD-10-CM) - S/P lumbar fusion    Rationale for Evaluation and Treatment: Rehabilitation  THERAPY DIAG:  Other low back pain  Muscle weakness (generalized)  Unsteadiness on feet  Other abnormalities of gait and mobility  ONSET DATE: L3-L5 transforaminal lumbar interbody fusion and decompression and allograft 06/04/23  SUBJECTIVE:                                                                                                                                                                                            SUBJECTIVE STATEMENT: Pt reports she has been doing her exercises, no pain right now    PERTINENT HISTORY:  See above   PAIN:  Are you having pain? Yes: NPRS scale: 0/10 now  Pain location: L side of low back  Pain description: catching feeling  Aggravating factors: standing for a long time, morning stiffness  Relieving factors: movement, heat   PRECAUTIONS: Fall and Other: no heavy lifting, otherwise clear of BLT/post op precautions   RED FLAGS: None   WEIGHT BEARING RESTRICTIONS: No  FALLS:  Has patient fallen in last 6 months? No and FOF (-)  LIVING ENVIRONMENT: Lives with: lives with their family son is there 24/7 for now, visiting from [place]  Lives in: House/apartment Stairs: 4 STE B rails, otherwise one level home  Has following equipment at home: Single point cane and Environmental consultant - 2 wheeled  OCCUPATION: retired- used to work in [employer] schools   PLOF: Independent, Independent with basic ADLs, Independent with gait, and Independent with transfers  PATIENT GOALS: build strength, be able to stand longer for cooking/cleaning/housework  NEXT MD VISIT: Referring 10/17/23  OBJECTIVE:  Note: Objective measures were completed at Evaluation unless otherwise noted.    PATIENT SURVEYS:  Modified Oswestry 38%   COGNITION: Overall cognitive status: Within functional limits for tasks assessed       MUSCLE LENGTH:  HS WNL B Piriformis moderate limitation B Hip flexors moderately tight per skilled observation  POSTURE: rounded shoulders, forward head, decreased lumbar lordosis, decreased thoracic kyphosis, and s/p lumbar surgery     LUMBAR ROM:   AROM eval  Flexion Moderate limitation   Extension Extreme limitation   Right lateral flexion Severe limitation  Left lateral flexion Severe limitation   Right rotation   Left rotation    (Blank rows = not tested)    LOWER EXTREMITY MMT:    MMT  Right eval Left eval  Hip flexion 4- 4-  Hip extension    Hip abduction 3+ 3  Hip adduction    Hip internal rotation    Hip external rotation    Knee flexion 4- 4+  Knee extension 4 5  Ankle dorsiflexion 5 5  Ankle plantarflexion    Ankle inversion    Ankle eversion     (Blank rows = not tested)    FUNCTIONAL TESTS:  5 times sit to stand: 21.6 seconds no UEs  Timed up and go (TUG): 14.4 seconds no device  3 minute walk test: 344 no device, 2 seated rest breaks   GAIT: Distance walked: 340ft Assistive device utilized: None Level of assistance: SBA Comments: hip ER tightness noted, flexed at hips, slightly wide BOS, needed one time MinA for balance when turning   TREATMENT DATE:  09/29/23 Nustep L5x36min UE/LE Standing heel raises 2 x 10 B Standing hip abduction 2 x 10 B Standing hip extension 2 x 10 B Seated LAQ B 2x10 2# Seated marching 2# 2x10 Sidesteps along counter 4x down and back  Seated rows and shoulder ext RTB x 10  09/22/23 Nustep L3x56min UE/LE BERG: 53/56 Standing heel raises x 10 B Standing hip abduction x 10 B Standing hip extension x 10 B Standing marches x 10 B Standing HS curls x 10  [employer] pew [employer] ant/post x 10 Seated marching RTB x 10   09/17/23  Eval, care planning, HEP  Seated march + TA set into red TB x10 Seated hip ABD red TB x10 STS with red TB above knees x5                                                                                                                                 PATIENT EDUCATION:  Education details: exam findings, POC, HEP  Person educated: Patient Education method: Explanation Education comprehension: verbalized understanding, returned demonstration, and needs further education  HOME EXERCISE PROGRAM: Access Code: [facility] URL: [email or web address] Date: 09/22/2023 Prepared by: Verta Ellen  Exercises - Seated March with Resistance  - 1 x daily - 7 x weekly - 1 sets - 20 reps -  Seated Hip Abduction with Resistance  - 1 x daily - 7 x weekly - 1 sets - 10 reps - 1 second  hold - Sit to Stand with Resistance Around Legs  - 1 x daily - 7 x weekly - 2 sets - 5 reps - Standing Hip Abduction with Counter Support  - 1 x daily - 7 x weekly - 2 sets - 10 reps - Standing Hip Extension with Counter Support  - 1 x daily - 7 x weekly - 2 sets - 10 reps - Standing March with Counter Support  - 1 x daily - 7 x weekly - 2 sets - 10 reps  ASSESSMENT:  CLINICAL IMPRESSION: Continued to focus on interventions to improve endurance and activity tolerance to improve function with ADLs. Cues provided with interventions as needed to target the appropriate muscles and prevent compensatory movements. She is  showing better tolerance for standing exercises but still requires seated breaks every now and again.  OBJECTIVE IMPAIRMENTS: Abnormal gait, decreased activity tolerance, decreased balance, decreased mobility, difficulty walking, decreased strength, postural dysfunction, obesity, and pain.   ACTIVITY LIMITATIONS: carrying, lifting, sitting, standing, sleeping, transfers, bed mobility, and locomotion level  PARTICIPATION LIMITATIONS: meal prep, cleaning, laundry, driving, shopping, community activity, and church  PERSONAL FACTORS: Age, Behavior pattern, Fitness, Past/current experiences, Social background, and Time since onset of injury/illness/exacerbation are also affecting patient's functional outcome.   REHAB POTENTIAL: Good  CLINICAL DECISION MAKING: Stable/uncomplicated  EVALUATION COMPLEXITY: Low   GOALS: Goals reviewed with patient? No  SHORT TERM GOALS: Target date: 10/15/2023    Will be compliant with appropriate progressive HEP  Baseline: Goal status: INITIAL  2.  Lumbar flexion and lateral flexion AROM to be no more than 25% limited, lumbar extension to be no more than 50% limited  Baseline:  Goal status: INITIAL  3.  Will demonstrate better awareness of  functional posture with all task performance  Baseline:  Goal status: INITIAL  4.  Mm flexibility impairment to have improved by 50%  Baseline:  Goal status: INITIAL    LONG TERM GOALS: Target date: 11/12/2023    MMT to have improved by at least one grade in all weak groups  Baseline:  Goal status: INITIAL  2.  Will ambulate at least 555ft in no device to show improved mobility and community access  Baseline:  Goal status: INITIAL  3.  Will complete 5xSTS in 15 seconds or less to show improved BLE strength and mobility  Baseline:  Goal status: INITIAL  4.  Will score at least 48 on Berg to show improved functional balance/reduced fall risk  Baseline:  Goal status: INITIAL  5.  Will tolerate standing for periods of at least 90 minutes in duration to improve ability to tolerate and perform home and self care  Baseline:  Goal status: INITIAL  6.  Oswestry to have improved by at least 15 points to show improved subjective QOL  Baseline:  Goal status: INITIAL  PLAN:  PT FREQUENCY: 2x/week  PT DURATION: 8 weeks  PLANNED INTERVENTIONS: 97164- PT Re-evaluation, 97110-Therapeutic exercises, 97530- Therapeutic activity, [id number]- Neuromuscular re-education, 97535- Self Care, 16109- Manual therapy, [id number]- Gait training, [id number]- Aquatic Therapy, Cryotherapy, and Moist heat.  PLAN FOR NEXT SESSION: clear of post-op precautions per patient; focus on strength, balance, general conditioning.  Darleene Cleaver, PTA  09/29/23 12:35 PM

## 2023-10-02 ENCOUNTER — Ambulatory Visit: Payer: Medicare PPO

## 2023-10-02 ENCOUNTER — Other Ambulatory Visit: Payer: Self-pay

## 2023-10-02 DIAGNOSIS — M5459 Other low back pain: Secondary | ICD-10-CM

## 2023-10-02 DIAGNOSIS — M6281 Muscle weakness (generalized): Secondary | ICD-10-CM

## 2023-10-02 DIAGNOSIS — R2689 Other abnormalities of gait and mobility: Secondary | ICD-10-CM

## 2023-10-02 DIAGNOSIS — R2681 Unsteadiness on feet: Secondary | ICD-10-CM | POA: Diagnosis not present

## 2023-10-02 NOTE — Therapy (Signed)
OUTPATIENT PHYSICAL THERAPY TREATMENT   Patient Name: Sharon Yu MRN: [id number] DOB:14-Oct-1950, 73 y.o., female Today's Date: 10/02/2023  END OF SESSION:  PT End of Session - 10/02/23 1016     Visit Number 4    Number of Visits 17    Date for PT Re-Evaluation 11/12/23    Authorization Type [employer] MCR    Authorization Time Period 09/17/23 to 11/12/23    Progress Note Due on Visit 10    PT Start Time 1015    PT Stop Time 1100    PT Time Calculation (min) 45 min    Activity Tolerance Patient tolerated treatment well    Behavior During Therapy [facility] for tasks assessed/performed                Past Medical History:  Diagnosis Date   Arthritis    Back pain    buldging disc   CHF (congestive heart failure) (HCC)    Chronic back pain    Diabetes mellitus without complication (HCC)    Hyperlipidemia    takes Crestor daily   Hypertension    takes Maxzide and Metoprolol daily along with Ramipril   Hypopotassemia    takes Potassium pill daily   Joint pain    Joint swelling    Nonischemic cardiomyopathy (HCC)    a. 07/2017: EF reduced to 25-30%, cath showing normal cors.    Shortness of breath    with exertion   Past Surgical History:  Procedure Laterality Date   bilateral knee arthroscopies     carpel tunnel     bilateral   CHOLECYSTECTOMY     colonscopy     JOINT REPLACEMENT Left    RIGHT/LEFT HEART CATH AND CORONARY ANGIOGRAPHY N/A 07/17/2017   Procedure: RIGHT/LEFT HEART CATH AND CORONARY ANGIOGRAPHY;  Surgeon: Swazil[place], Peter M, MD;  Location: [facility] INVASIVE CV LAB;  Service: Cardiovascular;  Laterality: N/A;   TOTAL KNEE ARTHROPLASTY Right 02/19/2013   Procedure: TOTAL KNEE ARTHROPLASTY;  Surgeon: Thera Flake., MD;  Location: [facility] OR;  Service: Orthopedics;  Laterality: Right;  right total knee arthroplasty   TOTAL KNEE REVISION Left 07/02/2013   Procedure: LEFT TOTAL KNEE TIBIA REVISION;  Surgeon: Nestor Lewandowsky, MD;  Location: [facility] OR;  Service: Orthopedics;   Laterality: Left;   TRANSFORAMINAL LUMBAR INTERBODY FUSION (TLIF) WITH PEDICLE SCREW FIXATION 2 LEVEL Right 06/04/2023   Procedure: RIGHT-SIDED LUMBAR THREE- LUMBAR FOUR, LUMBAR FOUR- LUMBAR FIVE TRANSFORAMINAL LUMBAR INTERBODY FUSION AND DECOMPRESSION WITH INSTRUMENTATION AND ALLOGRAFT;  Surgeon: Estill Bamberg, MD;  Location: [facility] OR;  Service: Orthopedics;  Laterality: Right;   Patient Active Problem List   Diagnosis Date Noted   Chronic gout of left foot 08/11/2023   Radiculopathy of lumbar region 06/04/2023   Age-related osteoporosis without current pathological fracture 08/03/2021   Chronic bilateral low back pain without sciatica 07/19/2020   Type 2 diabetes mellitus with obesity (HCC) 10/15/2018   Nonischemic cardiomyopathy (HCC) 07/31/2017   Type 2 diabetes mellitus with hyperglycemia, without long-term current use of insulin (HCC)    Morbid (severe) obesity due to excess calories (HCC)    Elevated troponin 07/15/2017   Chronic combined systolic and diastolic CHF (congestive heart failure) (HCC) 07/15/2017   Ascending aortic aneurysm (HCC)    CHF exacerbation (HCC) 07/14/2017   Acute posthemorrhagic anemia 08/11/2013   Hypokalemia 07/25/2013   Essential hypertension 07/25/2013   Hyperlipidemia LDL goal <100 07/25/2013   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) 07/02/2013  PCP: [name] DO   REFERRING PROVIDER: Estill Bamberg, MD  REFERRING DIAG:  Diagnosis  Z98.1 (ICD-10-CM) - S/P lumbar fusion    Rationale for Evaluation and Treatment: Rehabilitation  THERAPY DIAG:  Other low back pain  Muscle weakness (generalized)  Unsteadiness on feet  Other abnormalities of gait and mobility  ONSET DATE: L3-L5 transforaminal lumbar interbody fusion and decompression and allograft 06/04/23  SUBJECTIVE:                                                                                                                                                                                            SUBJECTIVE STATEMENT: Reports pain has eased down since her surgery.  Certain things like washing dishes in the kitchen seems to cause a catch in her L side and she has to sit down, which relieves her pain     PERTINENT HISTORY:  See above   PAIN:  Are you having pain? Yes: NPRS scale: 0/10 now  Pain location: L side of low back  Pain description: catching feeling  Aggravating factors: standing for a long time, morning stiffness  Relieving factors: movement, heat   PRECAUTIONS: Fall and Other: no heavy lifting, otherwise clear of BLT/post op precautions   RED FLAGS: None   WEIGHT BEARING RESTRICTIONS: No  FALLS:  Has patient fallen in last 6 months? No and FOF (-)  LIVING ENVIRONMENT: Lives with: lives with their family son is there 24/7 for now, visiting from [place]  Lives in: House/apartment Stairs: 4 STE B rails, otherwise one level home  Has following equipment at home: Single point cane and Environmental consultant - 2 wheeled  OCCUPATION: retired- used to work in [employer] schools   PLOF: Independent, Independent with basic ADLs, Independent with gait, and Independent with transfers  PATIENT GOALS: build strength, be able to stand longer for cooking/cleaning/housework  NEXT MD VISIT: Referring 10/17/23  OBJECTIVE:  Note: Objective measures were completed at Evaluation unless otherwise noted.  PATIENT SURVEYS:  Modified Oswestry 38%   COGNITION: Overall cognitive status: Within functional limits for tasks assessed       MUSCLE LENGTH:  HS WNL B Piriformis moderate limitation B Hip flexors moderately tight per skilled observation  POSTURE: rounded shoulders, forward head, decreased lumbar lordosis, decreased thoracic kyphosis, and s/p lumbar surgery     LUMBAR ROM:   AROM eval  Flexion Moderate limitation   Extension Extreme limitation   Right lateral flexion Severe limitation  Left lateral flexion Severe  limitation   Right rotation   Left rotation    (Blank rows = not tested)    LOWER EXTREMITY MMT:  MMT Right eval Left eval  Hip flexion 4- 4-  Hip extension    Hip abduction 3+ 3  Hip adduction    Hip internal rotation    Hip external rotation    Knee flexion 4- 4+  Knee extension 4 5  Ankle dorsiflexion 5 5  Ankle plantarflexion    Ankle inversion    Ankle eversion     (Blank rows = not tested)    FUNCTIONAL TESTS:  5 times sit to stand: 21.6 seconds no UEs  Timed up and go (TUG): 14.4 seconds no device  3 minute walk test: 344 no device, 2 seated rest breaks   GAIT: Distance walked: 331ft Assistive device utilized: None Level of assistance: SBA Comments: hip ER tightness noted, flexed at hips, slightly wide BOS, needed one time MinA for balance when turning   TREATMENT DATE:  10/02/23: Nustep L6, 6 min Sidelying on each side for 10 reps hip abd x 2 sets each Supine for bridging with B heels on 2 airex to engage gluteals, 10 x Clam shells 10 x 2 each leg Standing for heel raises with blue t band around ankles for lateral stability demand 20x Heel raises with ball squeeze between heels 20x Towel slides for/ back each leg for engaging lateral hip musculature Seated rows, 15# 2 x 10 hands vertical and 2 x 10 hands horizontal  09/29/23 Nustep L5x55min UE/LE Standing heel raises 2 x 10 B Standing hip abduction 2 x 10 B Standing hip extension 2 x 10 B Seated LAQ B 2x10 2# Seated marching 2# 2x10 Sidesteps along counter 4x down and back  Seated rows and shoulder ext RTB x 10  09/22/23 Nustep L3x54min UE/LE BERG: 53/56 Standing heel raises x 10 B Standing hip abduction x 10 B Standing hip extension x 10 B Standing marches x 10 B Standing HS curls x 10  [employer] pew [place] ant/post x 10 Seated marching RTB x 10   09/17/23  Eval, care planning, HEP  Seated march + TA set into red TB x10 Seated hip ABD red TB x10 STS with red TB above knees x5                                                                                                                                  PATIENT EDUCATION:  Education details: exam findings, POC, HEP  Person educated: Patient Education method: Explanation Education comprehension: verbalized understanding, returned demonstration, and needs further education  HOME EXERCISE PROGRAM: Access Code: [facility] URL: [email or web address] Date: 09/22/2023 Prepared by: Verta Ellen  Exercises - Seated March with Resistance  - 1 x daily - 7 x weekly - 1 sets - 20 reps - Seated Hip Abduction with Resistance  - 1 x daily - 7 x weekly - 1 sets - 10 reps - 1 second  hold - Sit to Stand with Resistance Around Legs  - 1 x daily - 7 x weekly -  2 sets - 5 reps - Standing Hip Abduction with Counter Support  - 1 x daily - 7 x weekly - 2 sets - 10 reps - Standing Hip Extension with Counter Support  - 1 x daily - 7 x weekly - 2 sets - 10 reps - Standing March with Counter Support  - 1 x daily - 7 x weekly - 2 sets - 10 reps  ASSESSMENT:  CLINICAL IMPRESSION: Continued to focus on interventions to improve endurance and activity tolerance to improve function with ADLs. She is 4 months post op.  We focused and progressed her lateral hip stability/ demand today . She tolerated well. Should continue to improve with skilled PT intervention.  OBJECTIVE IMPAIRMENTS: Abnormal gait, decreased activity tolerance, decreased balance, decreased mobility, difficulty walking, decreased strength, postural dysfunction, obesity, and pain.   ACTIVITY LIMITATIONS: carrying, lifting, sitting, standing, sleeping, transfers, bed mobility, and locomotion level  PARTICIPATION LIMITATIONS: meal prep, cleaning, laundry, driving, shopping, community activity, and church  PERSONAL FACTORS: Age, Behavior pattern, Fitness, Past/current experiences, Social background, and Time since onset of injury/illness/exacerbation are also affecting  patient's functional outcome.   REHAB POTENTIAL: Good  CLINICAL DECISION MAKING: Stable/uncomplicated  EVALUATION COMPLEXITY: Low   GOALS: Goals reviewed with patient? No  SHORT TERM GOALS: Target date: 10/15/2023    Will be compliant with appropriate progressive HEP  Baseline: Goal status: INITIAL  2.  Lumbar flexion and lateral flexion AROM to be no more than 25% limited, lumbar extension to be no more than 50% limited  Baseline:  Goal status: INITIAL  3.  Will demonstrate better awareness of functional posture with all task performance  Baseline:  Goal status: INITIAL  4.  Mm flexibility impairment to have improved by 50%  Baseline:  Goal status: INITIAL    LONG TERM GOALS: Target date: 11/12/2023    MMT to have improved by at least one grade in all weak groups  Baseline:  Goal status: INITIAL  2.  Will ambulate at least 558ft in no device to show improved mobility and community access  Baseline:  Goal status: INITIAL  3.  Will complete 5xSTS in 15 seconds or less to show improved BLE strength and mobility  Baseline:  Goal status: INITIAL  4.  Will score at least 48 on Berg to show improved functional balance/reduced fall risk  Baseline:  Goal status: INITIAL  5.  Will tolerate standing for periods of at least 90 minutes in duration to improve ability to tolerate and perform home and self care  Baseline:  Goal status: INITIAL  6.  Oswestry to have improved by at least 15 points to show improved subjective QOL  Baseline:  Goal status: INITIAL  PLAN:  PT FREQUENCY: 2x/week  PT DURATION: 8 weeks  PLANNED INTERVENTIONS: 97164- PT Re-evaluation, 97110-Therapeutic exercises, 97530- Therapeutic activity, [id number]- Neuromuscular re-education, 97535- Self Care, 16109- Manual therapy, [id number]- Gait training, [id number]- Aquatic Therapy, Cryotherapy, and Moist heat.  PLAN FOR NEXT SESSION: clear of post-op precautions per patient; focus on strength, balance,  general conditioning.  Harlo Fabela Frazier Richards, PT , DPT, OCS 10/02/23 4:06 PM

## 2023-10-06 ENCOUNTER — Ambulatory Visit: Payer: Medicare PPO | Attending: Orthopedic Surgery

## 2023-10-06 DIAGNOSIS — M5459 Other low back pain: Secondary | ICD-10-CM | POA: Diagnosis not present

## 2023-10-06 DIAGNOSIS — R2681 Unsteadiness on feet: Secondary | ICD-10-CM | POA: Insufficient documentation

## 2023-10-06 DIAGNOSIS — M6281 Muscle weakness (generalized): Secondary | ICD-10-CM | POA: Diagnosis not present

## 2023-10-06 DIAGNOSIS — R2689 Other abnormalities of gait and mobility: Secondary | ICD-10-CM | POA: Diagnosis not present

## 2023-10-06 NOTE — Therapy (Signed)
OUTPATIENT PHYSICAL THERAPY TREATMENT   Patient Name: Sharon Yu MRN: [id number] DOB:06-21-1951, 73 y.o., female Today's Date: 10/06/2023  END OF SESSION:  PT End of Session - 10/06/23 1108     Visit Number 5    Number of Visits 17    Date for PT Re-Evaluation 11/12/23    Authorization Type [employer] MCR    Authorization Time Period 09/17/23 to 11/12/23    Progress Note Due on Visit 10    PT Start Time 1101    PT Stop Time 1143    PT Time Calculation (min) 42 min    Activity Tolerance Patient tolerated treatment well    Behavior During Therapy [facility] for tasks assessed/performed                 Past Medical History:  Diagnosis Date   Arthritis    Back pain    buldging disc   CHF (congestive heart failure) (HCC)    Chronic back pain    Diabetes mellitus without complication (HCC)    Hyperlipidemia    takes Crestor daily   Hypertension    takes Maxzide and Metoprolol daily along with Ramipril   Hypopotassemia    takes Potassium pill daily   Joint pain    Joint swelling    Nonischemic cardiomyopathy (HCC)    a. 07/2017: EF reduced to 25-30%, cath showing normal cors.    Shortness of breath    with exertion   Past Surgical History:  Procedure Laterality Date   bilateral knee arthroscopies     carpel tunnel     bilateral   CHOLECYSTECTOMY     colonscopy     JOINT REPLACEMENT Left    RIGHT/LEFT HEART CATH AND CORONARY ANGIOGRAPHY N/A 07/17/2017   Procedure: RIGHT/LEFT HEART CATH AND CORONARY ANGIOGRAPHY;  Surgeon: [place], Peter M, MD;  Location: [facility] INVASIVE CV LAB;  Service: Cardiovascular;  Laterality: N/A;   TOTAL KNEE ARTHROPLASTY Right 02/19/2013   Procedure: TOTAL KNEE ARTHROPLASTY;  Surgeon: Thera Flake., MD;  Location: [facility] OR;  Service: Orthopedics;  Laterality: Right;  right total knee arthroplasty   TOTAL KNEE REVISION Left 07/02/2013   Procedure: LEFT TOTAL KNEE TIBIA REVISION;  Surgeon: Nestor Lewandowsky, MD;  Location: [facility] OR;  Service: Orthopedics;   Laterality: Left;   TRANSFORAMINAL LUMBAR INTERBODY FUSION (TLIF) WITH PEDICLE SCREW FIXATION 2 LEVEL Right 06/04/2023   Procedure: RIGHT-SIDED LUMBAR THREE- LUMBAR FOUR, LUMBAR FOUR- LUMBAR FIVE TRANSFORAMINAL LUMBAR INTERBODY FUSION AND DECOMPRESSION WITH INSTRUMENTATION AND ALLOGRAFT;  Surgeon: Estill Bamberg, MD;  Location: [facility] OR;  Service: Orthopedics;  Laterality: Right;   Patient Active Problem List   Diagnosis Date Noted   Chronic gout of left foot 08/11/2023   Radiculopathy of lumbar region 06/04/2023   Age-related osteoporosis without current pathological fracture 08/03/2021   Chronic bilateral low back pain without sciatica 07/19/2020   Type 2 diabetes mellitus with obesity (HCC) 10/15/2018   Nonischemic cardiomyopathy (HCC) 07/31/2017   Type 2 diabetes mellitus with hyperglycemia, without long-term current use of insulin (HCC)    Morbid (severe) obesity due to excess calories (HCC)    Elevated troponin 07/15/2017   Chronic combined systolic and diastolic CHF (congestive heart failure) (HCC) 07/15/2017   Ascending aortic aneurysm (HCC)    CHF exacerbation (HCC) 07/14/2017   Acute posthemorrhagic anemia 08/11/2013   Hypokalemia 07/25/2013   Essential hypertension 07/25/2013   Hyperlipidemia LDL goal <100 07/25/2013   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) 07/02/2013  PCP: [name] DO   REFERRING PROVIDER: Estill Bamberg, MD  REFERRING DIAG:  Diagnosis  Z98.1 (ICD-10-CM) - S/P lumbar fusion    Rationale for Evaluation and Treatment: Rehabilitation  THERAPY DIAG:  Other low back pain  Muscle weakness (generalized)  Unsteadiness on feet  Other abnormalities of gait and mobility  ONSET DATE: L3-L5 transforaminal lumbar interbody fusion and decompression and allograft 06/04/23  SUBJECTIVE:                                                                                                                                                                                            SUBJECTIVE STATEMENT: No new complaints just stiff    PERTINENT HISTORY:  See above   PAIN:  Are you having pain? Yes: NPRS scale: 0/10 now  Pain location: L side of low back  Pain description: catching feeling  Aggravating factors: standing for a long time, morning stiffness  Relieving factors: movement, heat   PRECAUTIONS: Fall and Other: no heavy lifting, otherwise clear of BLT/post op precautions   RED FLAGS: None   WEIGHT BEARING RESTRICTIONS: No  FALLS:  Has patient fallen in last 6 months? No and FOF (-)  LIVING ENVIRONMENT: Lives with: lives with their family son is there 24/7 for now, visiting from [place]  Lives in: House/apartment Stairs: 4 STE B rails, otherwise one level home  Has following equipment at home: Single point cane and Environmental consultant - 2 wheeled  OCCUPATION: retired- used to work in [employer] schools   PLOF: Independent, Independent with basic ADLs, Independent with gait, and Independent with transfers  PATIENT GOALS: build strength, be able to stand longer for cooking/cleaning/housework  NEXT MD VISIT: Referring 10/17/23  OBJECTIVE:  Note: Objective measures were completed at Evaluation unless otherwise noted.  PATIENT SURVEYS:  Modified Oswestry 38%   COGNITION: Overall cognitive status: Within functional limits for tasks assessed       MUSCLE LENGTH:  HS WNL B Piriformis moderate limitation B Hip flexors moderately tight per skilled observation  POSTURE: rounded shoulders, forward head, decreased lumbar lordosis, decreased thoracic kyphosis, and s/p lumbar surgery     LUMBAR ROM:   AROM eval  Flexion Moderate limitation   Extension Extreme limitation   Right lateral flexion Severe limitation  Left lateral flexion Severe limitation   Right rotation   Left rotation    (Blank rows = not tested)    LOWER EXTREMITY MMT:    MMT Right eval Left eval  Hip flexion 4- 4-   Hip extension    Hip abduction 3+ 3  Hip adduction    Hip internal rotation  Hip external rotation    Knee flexion 4- 4+  Knee extension 4 5  Ankle dorsiflexion 5 5  Ankle plantarflexion    Ankle inversion    Ankle eversion     (Blank rows = not tested)    FUNCTIONAL TESTS:  5 times sit to stand: 21.6 seconds no UEs  Timed up and go (TUG): 14.4 seconds no device  3 minute walk test: 344 no device, 2 seated rest breaks   GAIT: Distance walked: 354ft Assistive device utilized: None Level of assistance: SBA Comments: hip ER tightness noted, flexed at hips, slightly wide BOS, needed one time MinA for balance when turning   TREATMENT DATE:  10/06/23 Nustep L5x46min UE/LE Supine TrA sets x 10 Supine TrA + march x 10 Bridge 2x10 Supine clams no weight x 10 Seated lat pull 15# 2x10 Seated rows high grips 2x10 15# Standing bird dog at counter 2 x 5 Side steps at counter 10/02/23: Nustep L6, 6 min Sidelying on each side for 10 reps hip abd x 2 sets each Supine for bridging with B heels on 2 airex to engage gluteals, 10 x Clam shells 10 x 2 each leg Standing for heel raises with blue t band around ankles for lateral stability demand 20x Heel raises with ball squeeze between heels 20x Towel slides for/ back each leg for engaging lateral hip musculature Seated rows, 15# 2 x 10 hands vertical and 2 x 10 hands horizontal  09/29/23 Nustep L5x32min UE/LE Standing heel raises 2 x 10 B Standing hip abduction 2 x 10 B Standing hip extension 2 x 10 B Seated LAQ B 2x10 2# Seated marching 2# 2x10 Sidesteps along counter 4x down and back  Seated rows and shoulder ext RTB x 10   PATIENT EDUCATION:  Education details: exam findings, POC, HEP  Person educated: Patient Education method: Explanation Education comprehension: verbalized understanding, returned demonstration, and needs further education  HOME EXERCISE PROGRAM: Access Code: [facility] URL:  [email or web address] Date: 09/22/2023 Prepared by: Verta Ellen  Exercises - Seated March with Resistance  - 1 x daily - 7 x weekly - 1 sets - 20 reps - Seated Hip Abduction with Resistance  - 1 x daily - 7 x weekly - 1 sets - 10 reps - 1 second  hold - Sit to Stand with Resistance Around Legs  - 1 x daily - 7 x weekly - 2 sets - 5 reps - Standing Hip Abduction with Counter Support  - 1 x daily - 7 x weekly - 2 sets - 10 reps - Standing Hip Extension with Counter Support  - 1 x daily - 7 x weekly - 2 sets - 10 reps - Standing March with Counter Support  - 1 x daily - 7 x weekly - 2 sets - 10 reps  ASSESSMENT:  CLINICAL IMPRESSION: Interventions focused on strengthening for core and hip musculature, to help with functional standing tolerance. Pt still tires out pretty quick with standing, so we are trying to keep working on this.  OBJECTIVE IMPAIRMENTS: Abnormal gait, decreased activity tolerance, decreased balance, decreased mobility, difficulty walking, decreased strength, postural dysfunction, obesity, and pain.   ACTIVITY LIMITATIONS: carrying, lifting, sitting, standing, sleeping, transfers, bed mobility, and locomotion level  PARTICIPATION LIMITATIONS: meal prep, cleaning, laundry, driving, shopping, community activity, and church  PERSONAL FACTORS: Age, Behavior pattern, Fitness, Past/current experiences, Social background, and Time since onset of injury/illness/exacerbation are also affecting patient's functional outcome.   REHAB POTENTIAL: Good  CLINICAL DECISION MAKING: Stable/uncomplicated  EVALUATION COMPLEXITY: Low   GOALS: Goals reviewed with patient? No  SHORT TERM GOALS: Target date: 10/15/2023    Will be compliant with appropriate progressive HEP  Baseline: Goal status: MET- 10/06/23  2.  Lumbar flexion and lateral flexion AROM to be no more than 25% limited, lumbar extension to be no more than 50% limited  Baseline:  Goal status: INITIAL  3.   Will demonstrate better awareness of functional posture with all task performance  Baseline:  Goal status: INITIAL  4.  Mm flexibility impairment to have improved by 50%  Baseline:  Goal status: INITIAL    LONG TERM GOALS: Target date: 11/12/2023    MMT to have improved by at least one grade in all weak groups  Baseline:  Goal status: INITIAL  2.  Will ambulate at least 559ft in no device to show improved mobility and community access  Baseline:  Goal status: INITIAL  3.  Will complete 5xSTS in 15 seconds or less to show improved BLE strength and mobility  Baseline:  Goal status: INITIAL  4.  Will score at least 48 on Berg to show improved functional balance/reduced fall risk  Baseline:  Goal status: INITIAL  5.  Will tolerate standing for periods of at least 90 minutes in duration to improve ability to tolerate and perform home and self care  Baseline:  Goal status: INITIAL  6.  Oswestry to have improved by at least 15 points to show improved subjective QOL  Baseline:  Goal status: INITIAL  PLAN:  PT FREQUENCY: 2x/week  PT DURATION: 8 weeks  PLANNED INTERVENTIONS: 97164- PT Re-evaluation, 97110-Therapeutic exercises, 97530- Therapeutic activity, [id number]- Neuromuscular re-education, 97535- Self Care, 09811- Manual therapy, [id number]- Gait training, [id number]- Aquatic Therapy, Cryotherapy, and Moist heat.  PLAN FOR NEXT SESSION: clear of post-op precautions per patient; focus on strength, balance, general conditioning.  Darleene Cleaver, PTA 10/06/23 11:44 AM

## 2023-10-09 ENCOUNTER — Ambulatory Visit: Payer: Medicare PPO

## 2023-10-09 ENCOUNTER — Other Ambulatory Visit: Payer: Self-pay

## 2023-10-09 DIAGNOSIS — R2689 Other abnormalities of gait and mobility: Secondary | ICD-10-CM | POA: Diagnosis not present

## 2023-10-09 DIAGNOSIS — M5459 Other low back pain: Secondary | ICD-10-CM

## 2023-10-09 DIAGNOSIS — M6281 Muscle weakness (generalized): Secondary | ICD-10-CM

## 2023-10-09 DIAGNOSIS — R2681 Unsteadiness on feet: Secondary | ICD-10-CM | POA: Diagnosis not present

## 2023-10-09 NOTE — Therapy (Signed)
 OUTPATIENT PHYSICAL THERAPY TREATMENT   Patient Name: Sharon Yu MRN: [id number] DOB:1951/08/28, 73 y.o., female Today's Date: 10/09/2023  END OF SESSION:  PT End of Session - 10/09/23 1015     Visit Number 6    Date for PT Re-Evaluation 11/12/23    Authorization Type [employer] MCR    Authorization Time Period 09/17/23 to 11/12/23    Progress Note Due on Visit 10    PT Start Time 1015    PT Stop Time 1100    PT Time Calculation (min) 45 min    Activity Tolerance Patient tolerated treatment well    Behavior During Therapy [facility] for tasks assessed/performed             Past Medical History:  Diagnosis Date   Arthritis    Back pain    buldging disc   CHF (congestive heart failure) (HCC)    Chronic back pain    Diabetes mellitus without complication (HCC)    Hyperlipidemia    takes Crestor daily   Hypertension    takes Maxzide  and Metoprolol  daily along with Ramipril    Hypopotassemia    takes Potassium pill daily   Joint pain    Joint swelling    Nonischemic cardiomyopathy (HCC)    a. 07/2017: EF reduced to 25-30%, cath showing normal cors.    Shortness of breath    with exertion   Past Surgical History:  Procedure Laterality Date   bilateral knee arthroscopies     carpel tunnel     bilateral   CHOLECYSTECTOMY     colonscopy     JOINT REPLACEMENT Left    RIGHT/LEFT HEART CATH AND CORONARY ANGIOGRAPHY N/A 07/17/2017   Procedure: RIGHT/LEFT HEART CATH AND CORONARY ANGIOGRAPHY;  Surgeon: Jordan, Peter M, MD;  Location: [facility] INVASIVE CV LAB;  Service: Cardiovascular;  Laterality: N/A;   TOTAL KNEE ARTHROPLASTY Right 02/19/2013   Procedure: TOTAL KNEE ARTHROPLASTY;  Surgeon: LELON JONETTA Shari Mickey., MD;  Location: [facility] OR;  Service: Orthopedics;  Laterality: Right;  right total knee arthroplasty   TOTAL KNEE REVISION Left 07/02/2013   Procedure: LEFT TOTAL KNEE TIBIA REVISION;  Surgeon: Dempsey JINNY Sensor, MD;  Location: [facility] OR;  Service: Orthopedics;  Laterality: Left;    TRANSFORAMINAL LUMBAR INTERBODY FUSION (TLIF) WITH PEDICLE SCREW FIXATION 2 LEVEL Right 06/04/2023   Procedure: RIGHT-SIDED LUMBAR THREE- LUMBAR FOUR, LUMBAR FOUR- LUMBAR FIVE TRANSFORAMINAL LUMBAR INTERBODY FUSION AND DECOMPRESSION WITH INSTRUMENTATION AND ALLOGRAFT;  Surgeon: Beuford Anes, MD;  Location: [facility] OR;  Service: Orthopedics;  Laterality: Right;   Patient Active Problem List   Diagnosis Date Noted   Chronic gout of left foot 08/11/2023   Radiculopathy of lumbar region 06/04/2023   Age-related osteoporosis without current pathological fracture 08/03/2021   Chronic bilateral low back pain without sciatica 07/19/2020   Type 2 diabetes mellitus with obesity (HCC) 10/15/2018   Nonischemic cardiomyopathy (HCC) 07/31/2017   Type 2 diabetes mellitus with hyperglycemia, without long-term current use of insulin (HCC)    Morbid (severe) obesity due to excess calories (HCC)    Elevated troponin 07/15/2017   Chronic combined systolic and diastolic CHF (congestive heart failure) (HCC) 07/15/2017   Ascending aortic aneurysm (HCC)    CHF exacerbation (HCC) 07/14/2017   Acute posthemorrhagic anemia 08/11/2013   Hypokalemia 07/25/2013   Essential hypertension 07/25/2013   Hyperlipidemia LDL goal <100 07/25/2013   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) 07/02/2013    PCP: Frann Mabel Mt DO   REFERRING  PROVIDER: Beuford Anes, MD  REFERRING DIAG:  Diagnosis  Z98.1 (ICD-10-CM) - S/P lumbar fusion    Rationale for Evaluation and Treatment: Rehabilitation  THERAPY DIAG:  Other low back pain  Muscle weakness (generalized)  Unsteadiness on feet  Other abnormalities of gait and mobility  ONSET DATE: L3-L5 transforaminal lumbar interbody fusion and decompression and allograft 06/04/23  SUBJECTIVE:                                                                                                                                                                                            SUBJECTIVE STATEMENT: My endurance is getting a little better PERTINENT HISTORY:  See above   PAIN:  Are you having pain? Yes: NPRS scale: 0/10 now  Pain location: L side of low back  Pain description: catching feeling  Aggravating factors: standing for a long time, morning stiffness  Relieving factors: movement, heat   PRECAUTIONS: Fall and Other: no heavy lifting, otherwise clear of BLT/post op precautions   RED FLAGS: None   WEIGHT BEARING RESTRICTIONS: No  FALLS:  Has patient fallen in last 6 months? No and FOF (-)  LIVING ENVIRONMENT: Lives with: lives with their family son is there 24/7 for now, visiting from [place]   Lives in: House/apartment Stairs: 4 STE B rails, otherwise one level home  Has following equipment at home: Single point cane and Environmental Consultant - 2 wheeled  OCCUPATION: retired- used to work in [employer] schools   PLOF: Independent, Independent with basic ADLs, Independent with gait, and Independent with transfers  PATIENT GOALS: build strength, be able to stand longer for cooking/cleaning/housework  NEXT MD VISIT: Referring 10/17/23  OBJECTIVE:  Note: Objective measures were completed at Evaluation unless otherwise noted.  PATIENT SURVEYS:  Modified Oswestry 38%   COGNITION: Overall cognitive status: Within functional limits for tasks assessed       MUSCLE LENGTH:  HS WNL B Piriformis moderate limitation B Hip flexors moderately tight per skilled observation  POSTURE: rounded shoulders, forward head, decreased lumbar lordosis, decreased thoracic kyphosis, and s/p lumbar surgery     LUMBAR ROM:   AROM eval  Flexion Moderate limitation   Extension Extreme limitation   Right lateral flexion Severe limitation  Left lateral flexion Severe limitation   Right rotation   Left rotation    (Blank rows = not tested)    LOWER EXTREMITY MMT:    MMT Right eval Left eval  Hip flexion 4- 4-  Hip extension     Hip abduction 3+ 3  Hip adduction    Hip internal rotation    Hip external rotation  Knee flexion 4- 4+  Knee extension 4 5  Ankle dorsiflexion 5 5  Ankle plantarflexion    Ankle inversion    Ankle eversion     (Blank rows = not tested)    FUNCTIONAL TESTS:  5 times sit to stand: 21.6 seconds no UEs  Timed up and go (TUG): 14.4 seconds no device  3 minute walk test: 344 no device, 2 seated rest breaks   GAIT: Distance walked: 350ft Assistive device utilized: None Level of assistance: SBA Comments: hip ER tightness noted, flexed at hips, slightly wide BOS, needed one time MinA for balance when turning   TREATMENT DATE:  10/09/23: Therapeutic exercise, and neuromuscular re education to recruit deep stabilizers abdominals and hips to improve activity tolerance for upright tasks:   Nustep L 6 x 6 min UE/LE  Sidelying hip abd with 2# wt over distal thigh, 1 x 10 B Side lying clam shells with 2 # wt distal thigh, 1 x 10 B Supine B heels on 2 AIRex pads, bridging 15 reps Supine small amplitude marches with feet on 2 airex pads, 15 reps Supine reaches each arm to same ankle, in hook lying with feet on 2 airex pads 10 x each  Seated for long arc quads with opposite arm punches, mass practice, cues to engage TA prior to this ex Seated for alt marching with hip ER mass practice , encouraged pt to engage TA with this ex  Seated rows vertical grip, 20#, 2 X 10 Seated rows horizontal grip 20#, 1 x 12  Standing at counter on 6 step, B heel drops to stretch plantarflexors and with heel raises, 15 x  Forward step ups with opposite knee drivers to 6 step with one hand support on counter to engage post chain  10/06/23 Nustep L5x5min UE/LE Supine TrA sets x 10 Supine TrA + march x 10 Bridge 2x10 Supine clams no weight x 10 Seated lat pull 15# 2x10 Seated rows high grips 2x10 15# Standing bird dog at counter 2 x 5 Side steps at counter 10/02/23: Nustep L6, 6 min Sidelying on  each side for 10 reps hip abd x 2 sets each Supine for bridging with B heels on 2 airex to engage gluteals, 10 x Clam shells 10 x 2 each leg Standing for heel raises with blue t band around ankles for lateral stability demand 20x Heel raises with ball squeeze between heels 20x Towel slides for/ back each leg for engaging lateral hip musculature Seated rows, 15# 2 x 10 hands vertical and 2 x 10 hands horizontal  09/29/23 Nustep L5x20min UE/LE Standing heel raises 2 x 10 B Standing hip abduction 2 x 10 B Standing hip extension 2 x 10 B Seated LAQ B 2x10 2# Seated marching 2# 2x10 Sidesteps along counter 4x down and back  Seated rows and shoulder ext RTB x 10   PATIENT EDUCATION:  Education details: exam findings, POC, HEP  Person educated: Patient Education method: Explanation Education comprehension: verbalized understanding, returned demonstration, and needs further education  HOME EXERCISE PROGRAM: Access Code: [facility] URL: [email or web address] Date: 09/22/2023 Prepared by: Braylin Clark  Exercises - Seated March with Resistance  - 1 x daily - 7 x weekly - 1 sets - 20 reps - Seated Hip Abduction with Resistance  - 1 x daily - 7 x weekly - 1 sets - 10 reps - 1 second  hold - Sit to Stand with Resistance Around Legs  - 1 x daily - 7 x weekly -  2 sets - 5 reps - Standing Hip Abduction with Counter Support  - 1 x daily - 7 x weekly - 2 sets - 10 reps - Standing Hip Extension with Counter Support  - 1 x daily - 7 x weekly - 2 sets - 10 reps - Standing March with Counter Support  - 1 x daily - 7 x weekly - 2 sets - 10 reps  ASSESSMENT:  CLINICAL IMPRESSION: The pt returned today for skilled PT due to decreased strength and endurance and postural changes , and lumbar fusion L3 to 5 in October 2024. Gradually increasing demands on deep postural musculature as well as bulk strength for gluts, lats.  She tolerated well.  She continues to be a good candidate for skilled  PT to address her deficits and goals.  OBJECTIVE IMPAIRMENTS: Abnormal gait, decreased activity tolerance, decreased balance, decreased mobility, difficulty walking, decreased strength, postural dysfunction, obesity, and pain.   ACTIVITY LIMITATIONS: carrying, lifting, sitting, standing, sleeping, transfers, bed mobility, and locomotion level  PARTICIPATION LIMITATIONS: meal prep, cleaning, laundry, driving, shopping, community activity, and church  PERSONAL FACTORS: Age, Behavior pattern, Fitness, Past/current experiences, Social background, and Time since onset of injury/illness/exacerbation are also affecting patient's functional outcome.   REHAB POTENTIAL: Good  CLINICAL DECISION MAKING: Stable/uncomplicated  EVALUATION COMPLEXITY: Low   GOALS: Goals reviewed with patient? No  SHORT TERM GOALS: Target date: 10/15/2023    Will be compliant with appropriate progressive HEP  Baseline: Goal status: MET- 10/06/23  2.  Lumbar flexion and lateral flexion AROM to be no more than 25% limited, lumbar extension to be no more than 50% limited  Baseline:  Goal status: INITIAL  3.  Will demonstrate better awareness of functional posture with all task performance  Baseline:  Goal status: INITIAL  4.  Mm flexibility impairment to have improved by 50%  Baseline:  Goal status: INITIAL    LONG TERM GOALS: Target date: 11/12/2023    MMT to have improved by at least one grade in all weak groups  Baseline:  Goal status: INITIAL  2.  Will ambulate at least 524ft in no device to show improved mobility and community access  Baseline:  Goal status: INITIAL  3.  Will complete 5xSTS in 15 seconds or less to show improved BLE strength and mobility  Baseline:  Goal status: INITIAL  4.  Will score at least 48 on Berg to show improved functional balance/reduced fall risk  Baseline:  Goal status: INITIAL  5.  Will tolerate standing for periods of at least 90 minutes in duration to  improve ability to tolerate and perform home and self care  Baseline:  Goal status: INITIAL  6.  Oswestry to have improved by at least 15 points to show improved subjective QOL  Baseline:  Goal status: INITIAL  PLAN:  PT FREQUENCY: 2x/week  PT DURATION: 8 weeks  PLANNED INTERVENTIONS: 97164- PT Re-evaluation, 97110-Therapeutic exercises, 97530- Therapeutic activity, [id number]- Neuromuscular re-education, 97535- Self Care, 02859- Manual therapy, [id number]- Gait training, [id number]- Aquatic Therapy, Cryotherapy, and Moist heat.  PLAN FOR NEXT SESSION: clear of post-op precautions per patient; focus on strength, balance, general conditioning.  Brayon Bielefeld L Lestine Rahe, PT, DPT, OCS 10/09/23 11:18 AM

## 2023-10-13 ENCOUNTER — Ambulatory Visit: Payer: Medicare PPO

## 2023-10-13 DIAGNOSIS — M5459 Other low back pain: Secondary | ICD-10-CM | POA: Diagnosis not present

## 2023-10-13 DIAGNOSIS — M6281 Muscle weakness (generalized): Secondary | ICD-10-CM

## 2023-10-13 DIAGNOSIS — R2681 Unsteadiness on feet: Secondary | ICD-10-CM

## 2023-10-13 DIAGNOSIS — R2689 Other abnormalities of gait and mobility: Secondary | ICD-10-CM | POA: Diagnosis not present

## 2023-10-13 NOTE — Therapy (Signed)
 OUTPATIENT PHYSICAL THERAPY TREATMENT   Patient Name: Sharon Yu MRN: [id number] DOB:Apr 05, 1951, 73 y.o., female Today's Date: 10/13/2023  END OF SESSION:    Past Medical History:  Diagnosis Date   Arthritis    Back pain    buldging disc   CHF (congestive heart failure) (HCC)    Chronic back pain    Diabetes mellitus without complication (HCC)    Hyperlipidemia    takes Crestor daily   Hypertension    takes Maxzide  and Metoprolol  daily along with Ramipril    Hypopotassemia    takes Potassium pill daily   Joint pain    Joint swelling    Nonischemic cardiomyopathy (HCC)    a. 07/2017: EF reduced to 25-30%, cath showing normal cors.    Shortness of breath    with exertion   Past Surgical History:  Procedure Laterality Date   bilateral knee arthroscopies     carpel tunnel     bilateral   CHOLECYSTECTOMY     colonscopy     JOINT REPLACEMENT Left    RIGHT/LEFT HEART CATH AND CORONARY ANGIOGRAPHY N/A 07/17/2017   Procedure: RIGHT/LEFT HEART CATH AND CORONARY ANGIOGRAPHY;  Surgeon: [place], Peter M, MD;  Location: [facility] INVASIVE CV LAB;  Service: Cardiovascular;  Laterality: N/A;   TOTAL KNEE ARTHROPLASTY Right 02/19/2013   Procedure: TOTAL KNEE ARTHROPLASTY;  Surgeon: Forbes Ida., MD;  Location: [facility] OR;  Service: Orthopedics;  Laterality: Right;  right total knee arthroplasty   TOTAL KNEE REVISION Left 07/02/2013   Procedure: LEFT TOTAL KNEE TIBIA REVISION;  Surgeon: Ilean Mall, MD;  Location: [facility] OR;  Service: Orthopedics;  Laterality: Left;   TRANSFORAMINAL LUMBAR INTERBODY FUSION (TLIF) WITH PEDICLE SCREW FIXATION 2 LEVEL Right 06/04/2023   Procedure: RIGHT-SIDED LUMBAR THREE- LUMBAR FOUR, LUMBAR FOUR- LUMBAR FIVE TRANSFORAMINAL LUMBAR INTERBODY FUSION AND DECOMPRESSION WITH INSTRUMENTATION AND ALLOGRAFT;  Surgeon: Virl Grimes, MD;  Location: [facility] OR;  Service: Orthopedics;  Laterality: Right;   Patient Active Problem List   Diagnosis Date Noted   Chronic gout of  left foot 08/11/2023   Radiculopathy of lumbar region 06/04/2023   Age-related osteoporosis without current pathological fracture 08/03/2021   Chronic bilateral low back pain without sciatica 07/19/2020   Type 2 diabetes mellitus with obesity (HCC) 10/15/2018   Nonischemic cardiomyopathy (HCC) 07/31/2017   Type 2 diabetes mellitus with hyperglycemia, without long-term current use of insulin (HCC)    Morbid (severe) obesity due to excess calories (HCC)    Elevated troponin 07/15/2017   Chronic combined systolic and diastolic CHF (congestive heart failure) (HCC) 07/15/2017   Ascending aortic aneurysm (HCC)    CHF exacerbation (HCC) 07/14/2017   Acute posthemorrhagic anemia 08/11/2013   Hypokalemia 07/25/2013   Essential hypertension 07/25/2013   Hyperlipidemia LDL goal <100 07/25/2013   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 Yes: NPRS scale: 6/10 now  Pain location: L side of low back  Pain description: catching feeling  Aggravating factors: standing for a long time, morning stiffness  Relieving factors: movement, heat   PRECAUTIONS: Fall and Other: no heavy lifting,  otherwise clear of BLT/post op precautions   RED FLAGS: None   WEIGHT BEARING RESTRICTIONS: No  FALLS:  Has patient fallen in last 6 months? No and FOF (-)  LIVING ENVIRONMENT: Lives with: lives with their family son is there 24/7 for now, visiting from [place]   Lives in: House/apartment Stairs: 4 STE B rails, otherwise one level home  Has following equipment at home: Single point cane and Environmental consultant - 2 wheeled  OCCUPATION: retired- used to work in [employer] schools   PLOF: Independent, Independent with basic ADLs, Independent with gait, and Independent with transfers  PATIENT GOALS: build strength, be able to stand longer for cooking/cleaning/housework  NEXT MD VISIT: Referring 10/17/23  OBJECTIVE:  Note: Objective measures were completed at Evaluation unless otherwise noted.  PATIENT SURVEYS:  Modified Oswestry 38%   COGNITION: Overall cognitive status: Within functional limits for tasks assessed       MUSCLE LENGTH:  HS WNL B Piriformis moderate limitation B Hip flexors moderately tight per skilled observation  POSTURE: rounded shoulders, forward head, decreased lumbar lordosis, decreased thoracic kyphosis, and s/p lumbar surgery     LUMBAR ROM:   AROM eval  Flexion Moderate limitation   Extension Extreme limitation   Right lateral flexion Severe limitation  Left lateral flexion Severe limitation   Right rotation   Left rotation    (Blank rows = not tested)    LOWER EXTREMITY MMT:    MMT Right eval Left eval  Hip flexion 4- 4-  Hip extension    Hip abduction 3+ 3  Hip adduction    Hip internal rotation    Hip external rotation    Knee flexion 4- 4+  Knee extension 4 5  Ankle dorsiflexion 5 5  Ankle plantarflexion    Ankle inversion    Ankle eversion     (Blank rows = not tested)    FUNCTIONAL TESTS:  5 times sit to stand: 21.6 seconds no UEs  Timed up and go (TUG): 14.4 seconds no device  3 minute walk test: 344 no device, 2  seated rest breaks   GAIT: Distance walked: 339ft Assistive device utilized: None Level of assistance: SBA Comments: hip ER tightness noted, flexed at hips, slightly wide BOS, needed one time MinA for balance when turning   TREATMENT DATE:  10/13/23 Bike L2x55min STM to R lumbar paraspinals and QL prone Seated lumbar flexion 10x5"; added L side bend 10x5" Standing rows RTB x 10 - cues for scap retraction Standing shoulder ext RTB x 10  Standing pallof press RTB x 10 bil  10/09/23: Therapeutic exercise, and neuromuscular re education to recruit deep stabilizers abdominals and hips to improve activity tolerance for upright tasks:   Nustep L 6 x 6 min UE/LE  Sidelying hip abd with 2# wt over distal thigh, 1 x 10 B Side lying clam shells with 2 # wt distal thigh, 1 x 10 B Supine B heels on 2 AIRex pads, bridging 15 reps Supine small amplitude marches with feet on 2 airex pads, 15 reps Supine reaches each arm to same ankle, in hook lying  with feet on 2 airex pads 10 x each  Seated for long arc quads with opposite arm punches, mass practice, cues to engage TA prior to this ex Seated for alt marching with hip ER mass practice , encouraged pt to engage TA with this ex  Seated rows vertical grip, 20#, 2 X 10 Seated rows horizontal grip 20#, 1 x 12  Standing at counter on 6" step, B heel drops to stretch plantarflexors and with heel raises, 15 x  Forward step ups with opposite knee drivers to 6" step with one hand support on counter to engage post chain  10/06/23 Nustep L5x57min UE/LE Supine TrA sets x 10 Supine TrA + march x 10 Bridge 2x10 Supine clams no weight x 10 Seated lat pull 15# 2x10 Seated rows high grips 2x10 15# Standing bird dog at counter 2 x 5 Side steps at counter 10/02/23: Nustep L6, 6 min Sidelying on each side for 10 reps hip abd x 2 sets each Supine for bridging with B heels on 2 airex to engage gluteals, 10 x Clam shells 10 x 2 each leg Standing for heel raises  with blue t band around ankles for lateral stability demand 20x Heel raises with ball squeeze between heels 20x Towel slides for/ back each leg for engaging lateral hip musculature Seated rows, 15# 2 x 10 hands vertical and 2 x 10 hands horizontal  09/29/23 Nustep L5x87min UE/LE Standing heel raises 2 x 10 B Standing hip abduction 2 x 10 B Standing hip extension 2 x 10 B Seated LAQ B 2x10 2# Seated marching 2# 2x10 Sidesteps along counter 4x down and back  Seated rows and shoulder ext RTB x 10   PATIENT EDUCATION:  Education details: exam findings, POC, HEP  Person educated: Patient Education method: Explanation Education comprehension: verbalized understanding, returned demonstration, and needs further education  HOME EXERCISE PROGRAM: Access Code: [facility] URL: [email or web address] Date: 10/13/2023 Prepared by: Klint Lezcano  Exercises - Seated March with Resistance  - 1 x daily - 7 x weekly - 1 sets - 20 reps - Seated Hip Abduction with Resistance  - 1 x daily - 7 x weekly - 1 sets - 10 reps - 1 second  hold - Sit to Stand with Resistance Around Legs  - 1 x daily - 7 x weekly - 2 sets - 5 reps - Standing Hip Abduction with Counter Support  - 1 x daily - 7 x weekly - 2 sets - 10 reps - Standing Hip Extension with Counter Support  - 1 x daily - 7 x weekly - 2 sets - 10 reps - Standing March with Counter Support  - 1 x daily - 7 x weekly - 2 sets - 10 reps - Standing Shoulder Row with Anchored Resistance  - 1 x daily - 7 x weekly - 2 sets - 10 reps - Shoulder Extension with Anchored Resistance  - 1 x daily - 7 x weekly - 2 sets - 10 reps - Standing Anti-Rotation Press with Anchored Resistance  - 1 x daily - 7 x weekly - 2 sets - 10 reps  ASSESSMENT:  CLINICAL IMPRESSION: Started with STM today to address pain and muscle tension reported by patient. Pt reported improved pain after STM, thus proceeded with exercise for core engagement and postural strengthening.  Pt pretty fatigued after these exercises, so limited with TE today but updates were made for HEP for standing UE exercises.  OBJECTIVE IMPAIRMENTS: Abnormal gait, decreased activity tolerance, decreased balance,  decreased mobility, difficulty walking, decreased strength, postural dysfunction, obesity, and pain.   ACTIVITY LIMITATIONS: carrying, lifting, sitting, standing, sleeping, transfers, bed mobility, and locomotion level  PARTICIPATION LIMITATIONS: meal prep, cleaning, laundry, driving, shopping, community activity, and church  PERSONAL FACTORS: Age, Behavior pattern, Fitness, Past/current experiences, Social background, and Time since onset of injury/illness/exacerbation are also affecting patient's functional outcome.   REHAB POTENTIAL: Good  CLINICAL DECISION MAKING: Stable/uncomplicated  EVALUATION COMPLEXITY: Low   GOALS: Goals reviewed with patient? No  SHORT TERM GOALS: Target date: 10/15/2023    Will be compliant with appropriate progressive HEP  Baseline: Goal status: MET- 10/06/23  2.  Lumbar flexion and lateral flexion AROM to be no more than 25% limited, lumbar extension to be no more than 50% limited  Baseline:  Goal status: INITIAL  3.  Will demonstrate better awareness of functional posture with all task performance  Baseline:  Goal status: INITIAL  4.  Mm flexibility impairment to have improved by 50%  Baseline:  Goal status: INITIAL    LONG TERM GOALS: Target date: 11/12/2023    MMT to have improved by at least one grade in all weak groups  Baseline:  Goal status: INITIAL  2.  Will ambulate at least 563ft in no device to show improved mobility and community access  Baseline:  Goal status: INITIAL  3.  Will complete 5xSTS in 15 seconds or less to show improved BLE strength and mobility  Baseline:  Goal status: INITIAL  4.  Will score at least 48 on Berg to show improved functional balance/reduced fall risk  Baseline:  Goal status:  INITIAL  5.  Will tolerate standing for periods of at least 90 minutes in duration to improve ability to tolerate and perform home and self care  Baseline:  Goal status: INITIAL  6.  Oswestry to have improved by at least 15 points to show improved subjective QOL  Baseline:  Goal status: INITIAL  PLAN:  PT FREQUENCY: 2x/week  PT DURATION: 8 weeks  PLANNED INTERVENTIONS: [id number]- PT Re-evaluation, 97110-Therapeutic exercises, 97530- Therapeutic activity, [id number]- Neuromuscular re-education, 97535- Self Care, [id number]- Manual therapy, [id number]- Gait training, [id number]- Aquatic Therapy, Cryotherapy, and Moist heat.  PLAN FOR NEXT SESSION: clear of post-op precautions per patient; focus on strength, balance, general conditioning.  Zacharius Funari L Mahdi Frye, PTA 10/13/23 11:07 AM

## 2023-10-16 ENCOUNTER — Other Ambulatory Visit: Payer: Self-pay

## 2023-10-16 ENCOUNTER — Ambulatory Visit: Payer: Medicare PPO

## 2023-10-16 DIAGNOSIS — R2689 Other abnormalities of gait and mobility: Secondary | ICD-10-CM | POA: Diagnosis not present

## 2023-10-16 DIAGNOSIS — M6281 Muscle weakness (generalized): Secondary | ICD-10-CM | POA: Diagnosis not present

## 2023-10-16 DIAGNOSIS — M5459 Other low back pain: Secondary | ICD-10-CM | POA: Diagnosis not present

## 2023-10-16 DIAGNOSIS — R2681 Unsteadiness on feet: Secondary | ICD-10-CM | POA: Diagnosis not present

## 2023-10-16 NOTE — Therapy (Signed)
 OUTPATIENT PHYSICAL THERAPY TREATMENT   Patient Name: Sharon Yu MRN: [id number] DOB:Aug 09, 1951, 73 y.o., female Today's Date: 10/16/2023  END OF SESSION:  PT End of Session - 10/16/23 1237     Visit Number 8    Number of Visits 17    Date for PT Re-Evaluation 11/12/23    Authorization Type [employer] M[employer]    Authorization Time Period 09/17/23 to 11/12/23    Progress Note Due on Visit 10    PT Start Time 1150    PT Stop Time 1229    PT Time Calculation (min) 39 min    Activity Tolerance Patient tolerated treatment well    Behavior During Therapy [facility] for tasks assessed/performed              Past Medical History:  Diagnosis Date   Arthritis    Back pain    buldging disc   CHF (congestive heart failure) (HCC)    Chronic back pain    Diabetes mellitus without complication (HCC)    Hyperlipidemia    takes Crestor daily   Hypertension    takes Maxzide and Metoprolol daily along with Ramipril   Hypopotassemia    takes Potassium pill daily   Joint pain    Joint swelling    Nonischemic cardiomyopathy (HCC)    a. 07/2017: EF reduced to 25-30%, cath showing normal cors.    Shortness of breath    with exertion   Past Surgical History:  Procedure Laterality Date   bilateral knee arthroscopies     carpel tunnel     bilateral   CHOLECYSTECTOMY     colonscopy     JOINT REPLACEMENT Left    RIGHT/LEFT HEART CATH AND CORONARY ANGIOGRAPHY N/A 07/17/2017   Procedure: RIGHT/LEFT HEART CATH AND CORONARY ANGIOGRAPHY;  Surgeon: Swazil[place], Peter M, MD;  Location: [facility] INVASIVE CV LAB;  Service: Cardiovascular;  Laterality: N/A;   TOTAL KNEE ARTHROPLASTY Right 02/19/2013   Procedure: TOTAL KNEE ARTHROPLASTY;  Surgeon: Thera Flake., MD;  Location: [facility] OR;  Service: Orthopedics;  Laterality: Right;  right total knee arthroplasty   TOTAL KNEE REVISION Left 07/02/2013   Procedure: LEFT TOTAL KNEE TIBIA REVISION;  Surgeon: Nestor Lewandowsky, MD;  Location: [facility] OR;  Service: Orthopedics;   Laterality: Left;   TRANSFORAMINAL LUMBAR INTERBODY FUSION (TLIF) WITH PEDICLE SCREW FIXATION 2 LEVEL Right 06/04/2023   Procedure: RIGHT-SIDED LUMBAR THREE- LUMBAR FOUR, LUMBAR FOUR- LUMBAR FIVE TRANSFORAMINAL LUMBAR INTERBODY FUSION AND DECOMPRESSION WITH INSTRUMENTATION AND ALLOGRAFT;  Surgeon: Estill Bamberg, MD;  Location: [facility] OR;  Service: Orthopedics;  Laterality: Right;   Patient Active Problem List   Diagnosis Date Noted   Chronic gout of left foot 08/11/2023   Radiculopathy of lumbar region 06/04/2023   Age-related osteoporosis without current pathological fracture 08/03/2021   Chronic bilateral low back pain without sciatica 07/19/2020   Type 2 diabetes mellitus with obesity (HCC) 10/15/2018   Nonischemic cardiomyopathy (HCC) 07/31/2017   Type 2 diabetes mellitus with hyperglycemia, without long-term current use of insulin (HCC)    Morbid (severe) obesity due to excess calories (HCC)    Elevated troponin 07/15/2017   Chronic combined systolic and diastolic CHF (congestive heart failure) (HCC) 07/15/2017   Ascending aortic aneurysm (HCC)    CHF exacerbation (HCC) 07/14/2017   Acute posthemorrhagic anemia 08/11/2013   Hypokalemia 07/25/2013   Essential hypertension 07/25/2013   Hyperlipidemia LDL goal <100 07/25/2013   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) 07/02/2013  PCP: [name] DO   REFERRING PROVIDER: Estill Bamberg, MD  REFERRING DIAG:  Diagnosis  Z98.1 (ICD-10-CM) - S/P lumbar fusion    Rationale for Evaluation and Treatment: Rehabilitation  THERAPY DIAG:  Other low back pain  Muscle weakness (generalized)  Unsteadiness on feet  Other abnormalities of gait and mobility  ONSET DATE: L3-L5 transforaminal lumbar interbody fusion and decompression and allograft 06/04/23  SUBJECTIVE:                                                                                                                                                                                            SUBJECTIVE STATEMENT: Pt reports increased pain today no MOI, maybe slept wong PERTINENT HISTORY:  See above   PAIN:  Are you having pain? Yes: NPRS scale: 6/10 now  Pain location: L side of low back  Pain description: catching feeling  Aggravating factors: standing for a long time, morning stiffness  Relieving factors: movement, heat   PRECAUTIONS: Fall and Other: no heavy lifting, otherwise clear of BLT/post op precautions   RED FLAGS: None   WEIGHT BEARING RESTRICTIONS: No  FALLS:  Has patient fallen in last 6 months? No and FOF (-)  LIVING ENVIRONMENT: Lives with: lives with their family son is there 24/7 for now, visiting from [place]  Lives in: House/apartment Stairs: 4 STE B rails, otherwise one level home  Has following equipment at home: Single point cane and Environmental consultant - 2 wheeled  OCCUPATION: retired- used to work in [employer] schools   PLOF: Independent, Independent with basic ADLs, Independent with gait, and Independent with transfers  PATIENT GOALS: build strength, be able to stand longer for cooking/cleaning/housework  NEXT MD VISIT: Referring 10/17/23  OBJECTIVE:  Note: Objective measures were completed at Evaluation unless otherwise noted.  PATIENT SURVEYS:  Modified Oswestry 38%   COGNITION: Overall cognitive status: Within functional limits for tasks assessed       MUSCLE LENGTH:  HS WNL B Piriformis moderate limitation B Hip flexors moderately tight per skilled observation  POSTURE: rounded shoulders, forward head, decreased lumbar lordosis, decreased thoracic kyphosis, and s/p lumbar surgery     LUMBAR ROM:   AROM eval  Flexion Moderate limitation   Extension Extreme limitation   Right lateral flexion Severe limitation  Left lateral flexion Severe limitation   Right rotation   Left rotation    (Blank rows = not tested)    LOWER EXTREMITY MMT:    MMT Right eval Left eval   Hip flexion 4- 4-  Hip extension    Hip abduction 3+ 3  Hip adduction    Hip  internal rotation    Hip external rotation    Knee flexion 4- 4+  Knee extension 4 5  Ankle dorsiflexion 5 5  Ankle plantarflexion    Ankle inversion    Ankle eversion     (Blank rows = not tested)    FUNCTIONAL TESTS:  5 times sit to stand: 21.6 seconds no UEs  Timed up and go (TUG): 14.4 seconds no device  3 minute walk test: 344 no device, 2 seated rest breaks   GAIT: Distance walked: 323ft Assistive device utilized: None Level of assistance: SBA Comments: hip ER tightness noted, flexed at hips, slightly wide BOS, needed one time MinA for balance when turning   TREATMENT DATE:  10/16/23 combination of therapeutic activities and neuromuscular re education to engage deep hip stability as well as muscle memory, motor control lower abds. Side lying for clam shells and hip abd, 2# wt on knee, 2 x 10 each Supine for bridges with feet on step, then switched to physioball peanut 3 x 10 Supine dead bugs with legs over peanut to engage TA mass practice Supine legs over peanut for side reaches each arm with deep abdominal cxn mass practice Supine heels on peanut for abdominal set with B knees to chest motion mass practice  10/13/23 Bike L2x37min STM to R lumbar paraspinals and QL prone Seated lumbar flexion 10x5"; added L side bend 10x5" Standing rows RTB x 10 - cues for scap retraction Standing shoulder ext RTB x 10  Standing pallof press RTB x 10 bil  10/09/23: Therapeutic exercise, and neuromuscular re education to recruit deep stabilizers abdominals and hips to improve activity tolerance for upright tasks:   Nustep L 6 x 6 min UE/LE  Sidelying hip abd with 2# wt over distal thigh, 1 x 10 B Side lying clam shells with 2 # wt distal thigh, 1 x 10 B Supine B heels on 2 AIRex pads, bridging 15 reps Supine small amplitude marches with feet on 2 airex pads, 15 reps Supine reaches each arm to same ankle,  in hook lying with feet on 2 airex pads 10 x each  Seated for long arc quads with opposite arm punches, mass practice, cues to engage TA prior to this ex Seated for alt marching with hip ER mass practice , encouraged pt to engage TA with this ex  Seated rows vertical grip, 20#, 2 X 10 Seated rows horizontal grip 20#, 1 x 12  Standing at counter on 6" step, B heel drops to stretch plantarflexors and with heel raises, 15 x  Forward step ups with opposite knee drivers to 6" step with one hand support on counter to engage post chain  10/06/23 Nustep L5x80min UE/LE Supine TrA sets x 10 Supine TrA + march x 10 Bridge 2x10 Supine clams no weight x 10 Seated lat pull 15# 2x10 Seated rows high grips 2x10 15# Standing bird dog at counter 2 x 5 Side steps at counter 10/02/23: Nustep L6, 6 min Sidelying on each side for 10 reps hip abd x 2 sets each Supine for bridging with B heels on 2 airex to engage gluteals, 10 x Clam shells 10 x 2 each leg Standing for heel raises with blue t band around ankles for lateral stability demand 20x Heel raises with ball squeeze between heels 20x Towel slides for/ back each leg for engaging lateral hip musculature Seated rows, 15# 2 x 10 hands vertical and 2 x 10 hands horizontal  09/29/23 Nustep L5x4min UE/LE Standing heel  raises 2 x 10 B Standing hip abduction 2 x 10 B Standing hip extension 2 x 10 B Seated LAQ B 2x10 2# Seated marching 2# 2x10 Sidesteps along counter 4x down and back  Seated rows and shoulder ext RTB x 10   PATIENT EDUCATION:  Education details: exam findings, POC, HEP  Person educated: Patient Education method: Explanation Education comprehension: verbalized understanding, returned demonstration, and needs further education  HOME EXERCISE PROGRAM: Access Code: [facility] URL: [email or web address] Date: 10/13/2023 Prepared by: Verta Ellen  Exercises - Seated March with Resistance  - 1 x daily - 7 x weekly - 1  sets - 20 reps - Seated Hip Abduction with Resistance  - 1 x daily - 7 x weekly - 1 sets - 10 reps - 1 second  hold - Sit to Stand with Resistance Around Legs  - 1 x daily - 7 x weekly - 2 sets - 5 reps - Standing Hip Abduction with Counter Support  - 1 x daily - 7 x weekly - 2 sets - 10 reps - Standing Hip Extension with Counter Support  - 1 x daily - 7 x weekly - 2 sets - 10 reps - Standing March with Counter Support  - 1 x daily - 7 x weekly - 2 sets - 10 reps - Standing Shoulder Row with Anchored Resistance  - 1 x daily - 7 x weekly - 2 sets - 10 reps - Shoulder Extension with Anchored Resistance  - 1 x daily - 7 x weekly - 2 sets - 10 reps - Standing Anti-Rotation Press with Anchored Resistance  - 1 x daily - 7 x weekly - 2 sets - 10 reps  ASSESSMENT:  CLINICAL IMPRESSION: Sharon Yu is nearly 5 months post op from her L3 to L5 fusion. We are continuing to address her strength and stability especially for her hips and abdominal musculature.  She works very hard in Nurse, children's.  She has a follow up with her surgeon for tomorrow. She continues to benefit from skilled PT to address her recovery of function.  OBJECTIVE IMPAIRMENTS: Abnormal gait, decreased activity tolerance, decreased balance, decreased mobility, difficulty walking, decreased strength, postural dysfunction, obesity, and pain.   ACTIVITY LIMITATIONS: carrying, lifting, sitting, standing, sleeping, transfers, bed mobility, and locomotion level  PARTICIPATION LIMITATIONS: meal prep, cleaning, laundry, driving, shopping, community activity, and church  PERSONAL FACTORS: Age, Behavior pattern, Fitness, Past/current experiences, Social background, and Time since onset of injury/illness/exacerbation are also affecting patient's functional outcome.   REHAB POTENTIAL: Good  CLINICAL DECISION MAKING: Stable/uncomplicated  EVALUATION COMPLEXITY: Low   GOALS: Goals reviewed with patient? No  SHORT TERM GOALS: Target date:  10/15/2023    Will be compliant with appropriate progressive HEP  Baseline: Goal status: MET- 10/06/23  2.  Lumbar flexion and lateral flexion AROM to be no more than 25% limited, lumbar extension to be no more than 50% limited  Baseline:  Goal status: INITIAL  3.  Will demonstrate better awareness of functional posture with all task performance  Baseline:  Goal status: 10/16/23: met  4.  Mm flexibility impairment to have improved by 50%  Baseline:  Goal status: INITIAL    LONG TERM GOALS: Target date: 11/12/2023    MMT to have improved by at least one grade in all weak groups  Baseline:  Goal status: INITIAL  2.  Will ambulate at least 564ft in no device to show improved mobility and community access  Baseline:  Goal status: INITIAL  3.  Will complete 5xSTS in 15 seconds or less to show improved BLE strength and mobility  Baseline:  Goal status: INITIAL  4.  Will score at least 48 on Berg to show improved functional balance/reduced fall risk  Baseline:  Goal status: INITIAL  5.  Will tolerate standing for periods of at least 90 minutes in duration to improve ability to tolerate and perform home and self care  Baseline:  Goal status: INITIAL  6.  Oswestry to have improved by at least 15 points to show improved subjective QOL  Baseline:  Goal status: INITIAL  PLAN:  PT FREQUENCY: 2x/week  PT DURATION: 8 weeks  PLANNED INTERVENTIONS: 97164- PT Re-evaluation, 97110-Therapeutic exercises, 97530- Therapeutic activity, [id number]- Neuromuscular re-education, 97535- Self Care, [id number]- Manual therapy, [id number]- Gait training, [id number]- Aquatic Therapy, Cryotherapy, and Moist heat.  PLAN FOR NEXT SESSION: continued focus on strength, balance, general conditioning, core stability  Raelie Lohr L Tameko Halder, PT, DPT, OCS 10/16/23 12:40 PM

## 2023-10-17 DIAGNOSIS — M545 Low back pain, unspecified: Secondary | ICD-10-CM | POA: Diagnosis not present

## 2023-10-20 ENCOUNTER — Ambulatory Visit: Payer: Medicare PPO

## 2023-10-20 DIAGNOSIS — R2689 Other abnormalities of gait and mobility: Secondary | ICD-10-CM | POA: Diagnosis not present

## 2023-10-20 DIAGNOSIS — R2681 Unsteadiness on feet: Secondary | ICD-10-CM

## 2023-10-20 DIAGNOSIS — M5459 Other low back pain: Secondary | ICD-10-CM | POA: Diagnosis not present

## 2023-10-20 DIAGNOSIS — M6281 Muscle weakness (generalized): Secondary | ICD-10-CM | POA: Diagnosis not present

## 2023-10-20 NOTE — Therapy (Signed)
 OUTPATIENT PHYSICAL THERAPY TREATMENT   Patient Name: Sharon Yu MRN: [id number] DOB:13-Aug-1951, 73 y.o., female Today's Date: 10/20/2023  END OF SESSION:  PT End of Session - 10/20/23 1142     Visit Number 9    Number of Visits 17    Date for PT Re-Evaluation 11/12/23    Authorization Type [employer] M[employer]    Authorization Time Period 09/17/23 to 11/12/23    Progress Note Due on Visit 10    PT Start Time 1104    PT Stop Time 1142    PT Time Calculation (min) 38 min    Activity Tolerance Patient tolerated treatment well    Behavior During Therapy WFL for tasks assessed/performed               Past Medical History:  Diagnosis Date   Arthritis    Back pain    buldging disc   CHF (congestive heart failure) (HCC)    Chronic back pain    Diabetes mellitus without complication (HCC)    Hyperlipidemia    takes Crestor daily   Hypertension    takes Maxzide and Metoprolol daily along with Ramipril   Hypopotassemia    takes Potassium pill daily   Joint pain    Joint swelling    Nonischemic cardiomyopathy (HCC)    a. 07/2017: EF reduced to 25-30%, cath showing normal cors.    Shortness of breath    with exertion   Past Surgical History:  Procedure Laterality Date   bilateral knee arthroscopies     carpel tunnel     bilateral   CHOLECYSTECTOMY     colonscopy     JOINT REPLACEMENT Left    RIGHT/LEFT HEART CATH AND CORONARY ANGIOGRAPHY N/A 07/17/2017   Procedure: RIGHT/LEFT HEART CATH AND CORONARY ANGIOGRAPHY;  Surgeon: Swazil[place], Peter M, MD;  Location: [facility] INVASIVE CV LAB;  Service: Cardiovascular;  Laterality: N/A;   TOTAL KNEE ARTHROPLASTY Right 02/19/2013   Procedure: TOTAL KNEE ARTHROPLASTY;  Surgeon: Thera Flake., MD;  Location: [facility] OR;  Service: Orthopedics;  Laterality: Right;  right total knee arthroplasty   TOTAL KNEE REVISION Left 07/02/2013   Procedure: LEFT TOTAL KNEE TIBIA REVISION;  Surgeon: Nestor Lewandowsky, MD;  Location: [facility] OR;  Service: Orthopedics;   Laterality: Left;   TRANSFORAMINAL LUMBAR INTERBODY FUSION (TLIF) WITH PEDICLE SCREW FIXATION 2 LEVEL Right 06/04/2023   Procedure: RIGHT-SIDED LUMBAR THREE- LUMBAR FOUR, LUMBAR FOUR- LUMBAR FIVE TRANSFORAMINAL LUMBAR INTERBODY FUSION AND DECOMPRESSION WITH INSTRUMENTATION AND ALLOGRAFT;  Surgeon: Estill Bamberg, MD;  Location: [facility] OR;  Service: Orthopedics;  Laterality: Right;   Patient Active Problem List   Diagnosis Date Noted   Chronic gout of left foot 08/11/2023   Radiculopathy of lumbar region 06/04/2023   Age-related osteoporosis without current pathological fracture 08/03/2021   Chronic bilateral low back pain without sciatica 07/19/2020   Type 2 diabetes mellitus with obesity (HCC) 10/15/2018   Nonischemic cardiomyopathy (HCC) 07/31/2017   Type 2 diabetes mellitus with hyperglycemia, without long-term current use of insulin (HCC)    Morbid (severe) obesity due to excess calories (HCC)    Elevated troponin 07/15/2017   Chronic combined systolic and diastolic CHF (congestive heart failure) (HCC) 07/15/2017   Ascending aortic aneurysm (HCC)    CHF exacerbation (HCC) 07/14/2017   Acute posthemorrhagic anemia 08/11/2013   Hypokalemia 07/25/2013   Essential hypertension 07/25/2013   Hyperlipidemia LDL goal <100 07/25/2013   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) 07/02/2013  PCP: [name] DO   REFERRING PROVIDER: Estill Bamberg, MD  REFERRING DIAG:  Diagnosis  Z98.1 (ICD-10-CM) - S/P lumbar fusion    Rationale for Evaluation and Treatment: Rehabilitation  THERAPY DIAG:  Other low back pain  Muscle weakness (generalized)  Unsteadiness on feet  Other abnormalities of gait and mobility  ONSET DATE: L3-L5 transforaminal lumbar interbody fusion and decompression and allograft 06/04/23  SUBJECTIVE:                                                                                                                                                                                            SUBJECTIVE STATEMENT: Pt reports increased pain today no MOI, maybe slept wong PERTINENT HISTORY:  See above   PAIN:  Are you having pain? Yes: NPRS scale: 6/10 now  Pain location: L side of low back  Pain description: catching feeling  Aggravating factors: standing for a long time, morning stiffness  Relieving factors: movement, heat   PRECAUTIONS: Fall and Other: no heavy lifting, otherwise clear of BLT/post op precautions   RED FLAGS: None   WEIGHT BEARING RESTRICTIONS: No  FALLS:  Has patient fallen in last 6 months? No and FOF (-)  LIVING ENVIRONMENT: Lives with: lives with their family son is there 24/7 for now, visiting from [place]  Lives in: House/apartment Stairs: 4 STE B rails, otherwise one level home  Has following equipment at home: Single point cane and Environmental consultant - 2 wheeled  OCCUPATION: retired- used to work in [employer] schools   PLOF: Independent, Independent with basic ADLs, Independent with gait, and Independent with transfers  PATIENT GOALS: build strength, be able to stand longer for cooking/cleaning/housework  NEXT MD VISIT: Referring 10/17/23  OBJECTIVE:  Note: Objective measures were completed at Evaluation unless otherwise noted.  PATIENT SURVEYS:  Modified Oswestry 38%   COGNITION: Overall cognitive status: Within functional limits for tasks assessed       MUSCLE LENGTH:  HS WNL B Piriformis moderate limitation B Hip flexors moderately tight per skilled observation  POSTURE: rounded shoulders, forward head, decreased lumbar lordosis, decreased thoracic kyphosis, and s/p lumbar surgery     LUMBAR ROM:   AROM eval  Flexion Moderate limitation   Extension Extreme limitation   Right lateral flexion Severe limitation  Left lateral flexion Severe limitation   Right rotation   Left rotation    (Blank rows = not tested)    LOWER EXTREMITY MMT:    MMT Right eval Left eval  R 10/20/23 L 10/20/23  Hip flexion 4- 4- 4 4+  Hip extension      Hip abduction 3+  3 4+ 4+  Hip adduction      Hip internal rotation      Hip external rotation      Knee flexion 4- 4+ 4+ 4+  Knee extension 4 5 4+ 4+  Ankle dorsiflexion 5 5    Ankle plantarflexion      Ankle inversion      Ankle eversion       (Blank rows = not tested)    FUNCTIONAL TESTS:  5 times sit to stand: 21.6 seconds no UEs  Timed up and go (TUG): 14.4 seconds no device  3 minute walk test: 344 no device, 2 seated rest breaks   GAIT: Distance walked: 332ft Assistive device utilized: None Level of assistance: SBA Comments: hip ER tightness noted, flexed at hips, slightly wide BOS, needed one time MinA for balance when turning   TREATMENT DATE:  10/20/23 Therapeutic Exercise: to improve strength and mobility.  Demo, verbal and tactile cues throughout for technique.  Bike L2x28min Knee flexion 10# 2x10 BLE Knee extension 5# 2x10 BLE LE MMT NEUROMUSCULAR RE-EDUCATION: To improve posture, balance, and kinesthesia. Standing heel and toe raises  Church pew x 20 Retro steps x 10 bil Seated rows 15# 3x10  10/16/23 combination of therapeutic activities and neuromuscular re education to engage deep hip stability as well as muscle memory, motor control lower abds. Side lying for clam shells and hip abd, 2# wt on knee, 2 x 10 each Supine for bridges with feet on step, then switched to physioball peanut 3 x 10 Supine dead bugs with legs over peanut to engage TA mass practice Supine legs over peanut for side reaches each arm with deep abdominal cxn mass practice Supine heels on peanut for abdominal set with B knees to chest motion mass practice  10/13/23 Bike L2x40min STM to R lumbar paraspinals and QL prone Seated lumbar flexion 10x5"; added L side bend 10x5" Standing rows RTB x 10 - cues for scap retraction Standing shoulder ext RTB x 10  Standing pallof press RTB x 10 bil  10/09/23: Therapeutic exercise,  and neuromuscular re education to recruit deep stabilizers abdominals and hips to improve activity tolerance for upright tasks:   Nustep L 6 x 6 min UE/LE  Sidelying hip abd with 2# wt over distal thigh, 1 x 10 B Side lying clam shells with 2 # wt distal thigh, 1 x 10 B Supine B heels on 2 AIRex pads, bridging 15 reps Supine small amplitude marches with feet on 2 airex pads, 15 reps Supine reaches each arm to same ankle, in hook lying with feet on 2 airex pads 10 x each  Seated for long arc quads with opposite arm punches, mass practice, cues to engage TA prior to this ex Seated for alt marching with hip ER mass practice , encouraged pt to engage TA with this ex  Seated rows vertical grip, 20#, 2 X 10 Seated rows horizontal grip 20#, 1 x 12  Standing at counter on 6" step, B heel drops to stretch plantarflexors and with heel raises, 15 x  Forward step ups with opposite knee drivers to 6" step with one hand support on counter to engage post chain  10/06/23 Nustep L5x28min UE/LE Supine TrA sets x 10 Supine TrA + march x 10 Bridge 2x10 Supine clams no weight x 10 Seated lat pull 15# 2x10 Seated rows high grips 2x10 15# Standing bird dog at counter 2 x 5 Side steps at counter 10/02/23: Nustep L6, 6 min Sidelying  on each side for 10 reps hip abd x 2 sets each Supine for bridging with B heels on 2 airex to engage gluteals, 10 x Clam shells 10 x 2 each leg Standing for heel raises with blue t band around ankles for lateral stability demand 20x Heel raises with ball squeeze between heels 20x Towel slides for/ back each leg for engaging lateral hip musculature Seated rows, 15# 2 x 10 hands vertical and 2 x 10 hands horizontal  09/29/23 Nustep L5x11min UE/LE Standing heel raises 2 x 10 B Standing hip abduction 2 x 10 B Standing hip extension 2 x 10 B Seated LAQ B 2x10 2# Seated marching 2# 2x10 Sidesteps along counter 4x down and back  Seated rows and shoulder ext RTB x  10   PATIENT EDUCATION:  Education details: exam findings, POC, HEP  Person educated: Patient Education method: Explanation Education comprehension: verbalized understanding, returned demonstration, and needs further education  HOME EXERCISE PROGRAM: Access Code: [facility] URL: [email or web address] Date: 10/13/2023 Prepared by: Verta Ellen  Exercises - Seated March with Resistance  - 1 x daily - 7 x weekly - 1 sets - 20 reps - Seated Hip Abduction with Resistance  - 1 x daily - 7 x weekly - 1 sets - 10 reps - 1 second  hold - Sit to Stand with Resistance Around Legs  - 1 x daily - 7 x weekly - 2 sets - 5 reps - Standing Hip Abduction with Counter Support  - 1 x daily - 7 x weekly - 2 sets - 10 reps - Standing Hip Extension with Counter Support  - 1 x daily - 7 x weekly - 2 sets - 10 reps - Standing March with Counter Support  - 1 x daily - 7 x weekly - 2 sets - 10 reps - Standing Shoulder Row with Anchored Resistance  - 1 x daily - 7 x weekly - 2 sets - 10 reps - Shoulder Extension with Anchored Resistance  - 1 x daily - 7 x weekly - 2 sets - 10 reps - Standing Anti-Rotation Press with Anchored Resistance  - 1 x daily - 7 x weekly - 2 sets - 10 reps  ASSESSMENT:  CLINICAL IMPRESSION: Continued with interventions to improve standing tolerance and strength. Pt shows improvement in BLE strength. She is showing better tolerance for standing, still functional weakness in her legs with transfers and gait. Will continue to progress functional strengthening to improve QOL.  OBJECTIVE IMPAIRMENTS: Abnormal gait, decreased activity tolerance, decreased balance, decreased mobility, difficulty walking, decreased strength, postural dysfunction, obesity, and pain.   ACTIVITY LIMITATIONS: carrying, lifting, sitting, standing, sleeping, transfers, bed mobility, and locomotion level  PARTICIPATION LIMITATIONS: meal prep, cleaning, laundry, driving, shopping, community activity, and  church  PERSONAL FACTORS: Age, Behavior pattern, Fitness, Past/current experiences, Social background, and Time since onset of injury/illness/exacerbation are also affecting patient's functional outcome.   REHAB POTENTIAL: Good  CLINICAL DECISION MAKING: Stable/uncomplicated  EVALUATION COMPLEXITY: Low   GOALS: Goals reviewed with patient? No  SHORT TERM GOALS: Target date: 10/15/2023    Will be compliant with appropriate progressive HEP  Baseline: Goal status: MET- 10/06/23  2.  Lumbar flexion and lateral flexion AROM to be no more than 25% limited, lumbar extension to be no more than 50% limited  Baseline:  Goal status: INITIAL  3.  Will demonstrate better awareness of functional posture with all task performance  Baseline:  Goal status: 10/16/23: met  4.  Mm flexibility impairment to  have improved by 50%  Baseline:  Goal status: INITIAL    LONG TERM GOALS: Target date: 11/12/2023    MMT to have improved by at least one grade in all weak groups  Baseline:  Goal status: INITIAL  2.  Will ambulate at least 53ft in no device to show improved mobility and community access  Baseline:  Goal status: INITIAL  3.  Will complete 5xSTS in 15 seconds or less to show improved BLE strength and mobility  Baseline:  Goal status: INITIAL  4.  Will score at least 48 on Berg to show improved functional balance/reduced fall risk  Baseline:  Goal status: INITIAL  5.  Will tolerate standing for periods of at least 90 minutes in duration to improve ability to tolerate and perform home and self care  Baseline:  Goal status: INITIAL  6.  Oswestry to have improved by at least 15 points to show improved subjective QOL  Baseline:  Goal status: INITIAL  PLAN:  PT FREQUENCY: 2x/week  PT DURATION: 8 weeks  PLANNED INTERVENTIONS: [id number]- PT Re-evaluation, 97110-Therapeutic exercises, 97530- Therapeutic activity, [id number]- Neuromuscular re-education, 97535- Self Care, [id number]- Manual  therapy, [id number]- Gait training, [id number]- Aquatic Therapy, Cryotherapy, and Moist heat.  PLAN FOR NEXT SESSION: continued focus on strength, balance, general conditioning, core stability  Darleene Cleaver, PTA 10/20/23 11:44 AM

## 2023-10-27 ENCOUNTER — Ambulatory Visit: Payer: Medicare PPO

## 2023-10-27 DIAGNOSIS — R2689 Other abnormalities of gait and mobility: Secondary | ICD-10-CM

## 2023-10-27 DIAGNOSIS — R2681 Unsteadiness on feet: Secondary | ICD-10-CM

## 2023-10-27 DIAGNOSIS — M5459 Other low back pain: Secondary | ICD-10-CM

## 2023-10-27 DIAGNOSIS — M6281 Muscle weakness (generalized): Secondary | ICD-10-CM

## 2023-10-27 NOTE — Therapy (Signed)
 OUTPATIENT PHYSICAL THERAPY TREATMENT   Patient Name: Sharon Yu MRN: [id number] DOB:April 15, 1951, 73 y.o., female Today's Date: 10/27/2023  END OF SESSION:  PT End of Session - 10/27/23 1145     Visit Number 10    Number of Visits 17    Date for PT Re-Evaluation 11/12/23    Authorization Type [employer] MCR    Authorization Time Period 09/17/23 to 11/12/23    Progress Note Due on Visit 10    PT Start Time 1102    PT Stop Time 1144    PT Time Calculation (min) 42 min    Activity Tolerance Patient tolerated treatment well    Behavior During Therapy [facility] for tasks assessed/performed                Past Medical History:  Diagnosis Date   Arthritis    Back pain    buldging disc   CHF (congestive heart failure) (HCC)    Chronic back pain    Diabetes mellitus without complication (HCC)    Hyperlipidemia    takes Crestor daily   Hypertension    takes Maxzide and Metoprolol daily along with Ramipril   Hypopotassemia    takes Potassium pill daily   Joint pain    Joint swelling    Nonischemic cardiomyopathy (HCC)    a. 07/2017: EF reduced to 25-30%, cath showing normal cors.    Shortness of breath    with exertion   Past Surgical History:  Procedure Laterality Date   bilateral knee arthroscopies     carpel tunnel     bilateral   CHOLECYSTECTOMY     colonscopy     JOINT REPLACEMENT Left    RIGHT/LEFT HEART CATH AND CORONARY ANGIOGRAPHY N/A 07/17/2017   Procedure: RIGHT/LEFT HEART CATH AND CORONARY ANGIOGRAPHY;  Surgeon: Swazil[place], Peter M, MD;  Location: [facility] INVASIVE CV LAB;  Service: Cardiovascular;  Laterality: N/A;   TOTAL KNEE ARTHROPLASTY Right 02/19/2013   Procedure: TOTAL KNEE ARTHROPLASTY;  Surgeon: Thera Flake., MD;  Location: [facility] OR;  Service: Orthopedics;  Laterality: Right;  right total knee arthroplasty   TOTAL KNEE REVISION Left 07/02/2013   Procedure: LEFT TOTAL KNEE TIBIA REVISION;  Surgeon: Nestor Lewandowsky, MD;  Location: [facility] OR;  Service: Orthopedics;   Laterality: Left;   TRANSFORAMINAL LUMBAR INTERBODY FUSION (TLIF) WITH PEDICLE SCREW FIXATION 2 LEVEL Right 06/04/2023   Procedure: RIGHT-SIDED LUMBAR THREE- LUMBAR FOUR, LUMBAR FOUR- LUMBAR FIVE TRANSFORAMINAL LUMBAR INTERBODY FUSION AND DECOMPRESSION WITH INSTRUMENTATION AND ALLOGRAFT;  Surgeon: Estill Bamberg, MD;  Location: [facility] OR;  Service: Orthopedics;  Laterality: Right;   Patient Active Problem List   Diagnosis Date Noted   Chronic gout of left foot 08/11/2023   Radiculopathy of lumbar region 06/04/2023   Age-related osteoporosis without current pathological fracture 08/03/2021   Chronic bilateral low back pain without sciatica 07/19/2020   Type 2 diabetes mellitus with obesity (HCC) 10/15/2018   Nonischemic cardiomyopathy (HCC) 07/31/2017   Type 2 diabetes mellitus with hyperglycemia, without long-term current use of insulin (HCC)    Morbid (severe) obesity due to excess calories (HCC)    Elevated troponin 07/15/2017   Chronic combined systolic and diastolic CHF (congestive heart failure) (HCC) 07/15/2017   Ascending aortic aneurysm (HCC)    CHF exacerbation (HCC) 07/14/2017   Acute posthemorrhagic anemia 08/11/2013   Hypokalemia 07/25/2013   Essential hypertension 07/25/2013   Hyperlipidemia LDL goal <100 07/25/2013   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) 07/02/2013  PCP: [name] DO   REFERRING PROVIDER: Estill Bamberg, MD  REFERRING DIAG:  Diagnosis  Z98.1 (ICD-10-CM) - S/P lumbar fusion    Rationale for Evaluation and Treatment: Rehabilitation  THERAPY DIAG:  Other low back pain  Muscle weakness (generalized)  Unsteadiness on feet  Other abnormalities of gait and mobility  ONSET DATE: L3-L5 transforaminal lumbar interbody fusion and decompression and allograft 06/04/23  SUBJECTIVE:                                                                                                                                                                                            SUBJECTIVE STATEMENT: No new complaints PERTINENT HISTORY:  See above   PAIN:  Are you having pain? Yes: NPRS scale: 6/10 now  Pain location: L side of low back  Pain description: catching feeling  Aggravating factors: standing for a long time, morning stiffness  Relieving factors: movement, heat   PRECAUTIONS: Fall and Other: no heavy lifting, otherwise clear of BLT/post op precautions   RED FLAGS: None   WEIGHT BEARING RESTRICTIONS: No  FALLS:  Has patient fallen in last 6 months? No and FOF (-)  LIVING ENVIRONMENT: Lives with: lives with their family son is there 24/7 for now, visiting from [place]  Lives in: House/apartment Stairs: 4 STE B rails, otherwise one level home  Has following equipment at home: Single point cane and Environmental consultant - 2 wheeled  OCCUPATION: retired- used to work in [employer] schools   PLOF: Independent, Independent with basic ADLs, Independent with gait, and Independent with transfers  PATIENT GOALS: build strength, be able to stand longer for cooking/cleaning/housework  NEXT MD VISIT: Referring 10/17/23  OBJECTIVE:  Note: Objective measures were completed at Evaluation unless otherwise noted.  PATIENT SURVEYS:  Modified Oswestry 38%   COGNITION: Overall cognitive status: Within functional limits for tasks assessed       MUSCLE LENGTH:  HS WNL B Piriformis moderate limitation B Hip flexors moderately tight per skilled observation  POSTURE: rounded shoulders, forward head, decreased lumbar lordosis, decreased thoracic kyphosis, and s/p lumbar surgery     LUMBAR ROM:   AROM eval 10/27/23  Flexion Moderate limitation  25% limited  Extension Extreme limitation  50% limited  Right lateral flexion Severe limitation 75%  Left lateral flexion Severe limitation  75%  Right rotation    Left rotation     (Blank rows = not tested)    LOWER EXTREMITY MMT:    MMT Right eval  Left eval R 10/20/23 L 10/20/23  Hip flexion 4- 4- 4 4+  Hip extension      Hip  abduction 3+ 3 4+ 4+  Hip adduction      Hip internal rotation      Hip external rotation      Knee flexion 4- 4+ 4+ 4+  Knee extension 4 5 4+ 4+  Ankle dorsiflexion 5 5    Ankle plantarflexion      Ankle inversion      Ankle eversion       (Blank rows = not tested)    FUNCTIONAL TESTS:  5 times sit to stand: 21.6 seconds no UEs  Timed up and go (TUG): 14.4 seconds no device  3 minute walk test: 344 no device, 2 seated rest breaks   GAIT: Distance walked: 367ft Assistive device utilized: None Level of assistance: SBA Comments: hip ER tightness noted, flexed at hips, slightly wide BOS, needed one time MinA for balance when turning   TREATMENT DATE:  2/10/07/23 Nustep L6x2min UE/LE 5xSTS- 15.36 seconds 3 min walk test: 318 ft rest required after 1 min 30 sec; after test able to walk additional 300 ft Lumbar AROM LE flexibility and assessment of other goals 10/20/23 Therapeutic Exercise: to improve strength and mobility.  Demo, verbal and tactile cues throughout for technique.  Bike L2x55min Knee flexion 10# 2x10 BLE Knee extension 5# 2x10 BLE LE MMT NEUROMUSCULAR RE-EDUCATION: To improve posture, balance, and kinesthesia. Standing heel and toe raises  Church pew x 20 Retro steps x 10 bil Seated rows 15# 3x10  10/16/23 combination of therapeutic activities and neuromuscular re education to engage deep hip stability as well as muscle memory, motor control lower abds. Side lying for clam shells and hip abd, 2# wt on knee, 2 x 10 each Supine for bridges with feet on step, then switched to physioball peanut 3 x 10 Supine dead bugs with legs over peanut to engage TA mass practice Supine legs over peanut for side reaches each arm with deep abdominal cxn mass practice Supine heels on peanut for abdominal set with B knees to chest motion mass practice  10/13/23 Bike L2x52min STM to R lumbar  paraspinals and QL prone Seated lumbar flexion 10x5"; added L side bend 10x5" Standing rows RTB x 10 - cues for scap retraction Standing shoulder ext RTB x 10  Standing pallof press RTB x 10 bil  10/09/23: Therapeutic exercise, and neuromuscular re education to recruit deep stabilizers abdominals and hips to improve activity tolerance for upright tasks:   Nustep L 6 x 6 min UE/LE  Sidelying hip abd with 2# wt over distal thigh, 1 x 10 B Side lying clam shells with 2 # wt distal thigh, 1 x 10 B Supine B heels on 2 AIRex pads, bridging 15 reps Supine small amplitude marches with feet on 2 airex pads, 15 reps Supine reaches each arm to same ankle, in hook lying with feet on 2 airex pads 10 x each  Seated for long arc quads with opposite arm punches, mass practice, cues to engage TA prior to this ex Seated for alt marching with hip ER mass practice , encouraged pt to engage TA with this ex  Seated rows vertical grip, 20#, 2 X 10 Seated rows horizontal grip 20#, 1 x 12  Standing at counter on 6" step, B heel drops to stretch plantarflexors and with heel raises, 15 x  Forward step ups with opposite knee drivers to 6" step with one hand support on counter to engage post chain  10/06/23 Nustep L5x72min UE/LE Supine TrA sets x 10 Supine TrA + march  x 10 Bridge 2x10 Supine clams no weight x 10 Seated lat pull 15# 2x10 Seated rows high grips 2x10 15# Standing bird dog at counter 2 x 5 Side steps at counter 10/02/23: Nustep L6, 6 min Sidelying on each side for 10 reps hip abd x 2 sets each Supine for bridging with B heels on 2 airex to engage gluteals, 10 x Clam shells 10 x 2 each leg Standing for heel raises with blue t band around ankles for lateral stability demand 20x Heel raises with ball squeeze between heels 20x Towel slides for/ back each leg for engaging lateral hip musculature Seated rows, 15# 2 x 10 hands vertical and 2 x 10 hands horizontal  09/29/23 Nustep L5x72min  UE/LE Standing heel raises 2 x 10 B Standing hip abduction 2 x 10 B Standing hip extension 2 x 10 B Seated LAQ B 2x10 2# Seated marching 2# 2x10 Sidesteps along counter 4x down and back  Seated rows and shoulder ext RTB x 10   PATIENT EDUCATION:  Education details: exam findings, POC, HEP  Person educated: Patient Education method: Explanation Education comprehension: verbalized understanding, returned demonstration, and needs further education  HOME EXERCISE PROGRAM: Access Code: [facility] URL: [email or web address] Date: 10/13/2023 Prepared by: Verta Ellen  Exercises - Seated March with Resistance  - 1 x daily - 7 x weekly - 1 sets - 20 reps - Seated Hip Abduction with Resistance  - 1 x daily - 7 x weekly - 1 sets - 10 reps - 1 second  hold - Sit to Stand with Resistance Around Legs  - 1 x daily - 7 x weekly - 2 sets - 5 reps - Standing Hip Abduction with Counter Support  - 1 x daily - 7 x weekly - 2 sets - 10 reps - Standing Hip Extension with Counter Support  - 1 x daily - 7 x weekly - 2 sets - 10 reps - Standing March with Counter Support  - 1 x daily - 7 x weekly - 2 sets - 10 reps - Standing Shoulder Row with Anchored Resistance  - 1 x daily - 7 x weekly - 2 sets - 10 reps - Shoulder Extension with Anchored Resistance  - 1 x daily - 7 x weekly - 2 sets - 10 reps - Standing Anti-Rotation Press with Anchored Resistance  - 1 x daily - 7 x weekly - 2 sets - 10 reps  ASSESSMENT:  CLINICAL IMPRESSION: Pt is showing improvement with walking endurance, lumbar AROM, and LE strength. She has met LTG  #3 for 5xSTS. LTG #4 was met on her second visit. Her standing tolerance is limited to 15 minutes at a time. 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  OBJECTIVE  IMPAIRMENTS: Abnormal gait, decreased activity tolerance, decreased balance, decreased mobility, difficulty walking, decreased strength, postural dysfunction, obesity, and pain.   ACTIVITY LIMITATIONS: carrying, lifting, sitting, standing, sleeping, transfers, bed mobility, and locomotion level  PARTICIPATION LIMITATIONS: meal prep, cleaning, laundry, driving, shopping, community activity, and church  PERSONAL FACTORS: Age, Behavior pattern, Fitness, Past/current experiences, Social background, and Time since onset of injury/illness/exacerbation are also affecting patient's functional outcome.   REHAB POTENTIAL: Good  CLINICAL DECISION MAKING: Stable/uncomplicated  EVALUATION COMPLEXITY: Low   GOALS: Goals reviewed with patient? No  SHORT TERM GOALS: Target date:  10/15/2023    Will be compliant with appropriate progressive HEP  Baseline: Goal status: MET- 10/06/23  2.  Lumbar flexion and lateral flexion AROM to be no more than 25% limited, lumbar extension to be no more than 50% limited  Baseline:  Goal status: PROGRESSING: 10/27/23 met for flexion and extension  3.  Will demonstrate better awareness of functional posture with all task performance  Baseline:  Goal status: 10/16/23: met  4.  Mm flexibility impairment to have improved by 50%  Baseline:  Goal status: PROGRESSING- 10/27/23    LONG TERM GOALS: Target date: 11/12/2023    MMT to have improved by at least one grade in all weak groups  Baseline:  Goal status: PROGRESSING 10/19/22  2.  Will ambulate at least 566ft in no device to show improved mobility and community access  Baseline:  Goal status: PROGRESSING; 10/27/23 see treatment  3.  Will complete 5xSTS in 15 seconds or less to show improved BLE strength and mobility  Baseline:  Goal status: MET- 15.36 seconds 10/27/23  4.  Will score at least 48 on Berg to show improved functional balance/reduced fall risk  Baseline:  Goal status: MET- 53/56 on visit  #2  5.  Will tolerate standing for periods of at least 90 minutes in duration to improve ability to tolerate and perform home and self care  Baseline:  Goal status: 10/28/23- 15 minutes  6.  Oswestry to have improved by at least 15 points to show improved subjective QOL  Baseline:  Goal status: INITIAL  PLAN:  PT FREQUENCY: 2x/week  PT DURATION: 8 weeks  PLANNED INTERVENTIONS: 97164- PT Re-evaluation, 97110-Therapeutic exercises, 97530- Therapeutic activity, [id number]- Neuromuscular re-education, 97535- Self Care, [id number]- Manual therapy, [id number]- Gait training, [id number]- Aquatic Therapy, Cryotherapy, and Moist heat.  PLAN FOR NEXT SESSION: need to do oswestry; continued focus on strength, balance, general conditioning, core stability  Darleene Cleaver, PTA 10/27/23 11:57 AM I agree with the findings above.  Amy Speaks, PT, DPT, OCS

## 2023-10-30 ENCOUNTER — Ambulatory Visit: Payer: Medicare PPO

## 2023-10-30 ENCOUNTER — Other Ambulatory Visit: Payer: Self-pay

## 2023-10-30 DIAGNOSIS — R2689 Other abnormalities of gait and mobility: Secondary | ICD-10-CM | POA: Diagnosis not present

## 2023-10-30 DIAGNOSIS — R2681 Unsteadiness on feet: Secondary | ICD-10-CM

## 2023-10-30 DIAGNOSIS — M5459 Other low back pain: Secondary | ICD-10-CM | POA: Diagnosis not present

## 2023-10-30 DIAGNOSIS — M6281 Muscle weakness (generalized): Secondary | ICD-10-CM

## 2023-10-30 NOTE — Therapy (Signed)
 OUTPATIENT PHYSICAL THERAPY TREATMENT   Patient Name: Sharon Yu MRN: [id number] DOB:06-Oct-1950, 73 y.o., female Today's Date: 10/30/2023  END OF SESSION:       Past Medical History:  Diagnosis Date   Arthritis    Back pain    buldging disc   CHF (congestive heart failure) (HCC)    Chronic back pain    Diabetes mellitus without complication (HCC)    Hyperlipidemia    takes Crestor daily   Hypertension    takes Maxzide and Metoprolol daily along with Ramipril   Hypopotassemia    takes Potassium pill daily   Joint pain    Joint swelling    Nonischemic cardiomyopathy (HCC)    a. 07/2017: EF reduced to 25-30%, cath showing normal cors.    Shortness of breath    with exertion   Past Surgical History:  Procedure Laterality Date   bilateral knee arthroscopies     carpel tunnel     bilateral   CHOLECYSTECTOMY     colonscopy     JOINT REPLACEMENT Left    RIGHT/LEFT HEART CATH AND CORONARY ANGIOGRAPHY N/A 07/17/2017   Procedure: RIGHT/LEFT HEART CATH AND CORONARY ANGIOGRAPHY;  Surgeon: Swaziland, Peter M, MD;  Location: [facility] INVASIVE CV LAB;  Service: Cardiovascular;  Laterality: N/A;   TOTAL KNEE ARTHROPLASTY Right 02/19/2013   Procedure: TOTAL KNEE ARTHROPLASTY;  Surgeon: Thera Flake., MD;  Location: [facility] OR;  Service: Orthopedics;  Laterality: Right;  right total knee arthroplasty   TOTAL KNEE REVISION Left 07/02/2013   Procedure: LEFT TOTAL KNEE TIBIA REVISION;  Surgeon: Nestor Lewandowsky, MD;  Location: [facility] OR;  Service: Orthopedics;  Laterality: Left;   TRANSFORAMINAL LUMBAR INTERBODY FUSION (TLIF) WITH PEDICLE SCREW FIXATION 2 LEVEL Right 06/04/2023   Procedure: RIGHT-SIDED LUMBAR THREE- LUMBAR FOUR, LUMBAR FOUR- LUMBAR FIVE TRANSFORAMINAL LUMBAR INTERBODY FUSION AND DECOMPRESSION WITH INSTRUMENTATION AND ALLOGRAFT;  Surgeon: Estill Bamberg, MD;  Location: [facility] OR;  Service: Orthopedics;  Laterality: Right;   Patient Active Problem List   Diagnosis Date Noted   Chronic  gout of left foot 08/11/2023   Radiculopathy of lumbar region 06/04/2023   Age-related osteoporosis without current pathological fracture 08/03/2021   Chronic bilateral low back pain without sciatica 07/19/2020   Type 2 diabetes mellitus with obesity (HCC) 10/15/2018   Nonischemic cardiomyopathy (HCC) 07/31/2017   Type 2 diabetes mellitus with hyperglycemia, without long-term current use of insulin (HCC)    Morbid (severe) obesity due to excess calories (HCC)    Elevated troponin 07/15/2017   Chronic combined systolic and diastolic CHF (congestive heart failure) (HCC) 07/15/2017   Ascending aortic aneurysm (HCC)    CHF exacerbation (HCC) 07/14/2017   Acute posthemorrhagic anemia 08/11/2013   Hypokalemia 07/25/2013   Essential hypertension 07/25/2013   Hyperlipidemia LDL goal <100 07/25/2013   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  ONSET DATE: L3-L5 transforaminal lumbar interbody fusion and decompression and allograft 06/04/23  SUBJECTIVE:  SUBJECTIVE STATEMENT: No new complaints PERTINENT HISTORY:  See above   PAIN:  Are you having pain? Yes: NPRS scale: 6/10 now  Pain location: L side of low back  Pain description: catching feeling  Aggravating factors: standing for a long time, morning stiffness  Relieving factors: movement, heat   PRECAUTIONS: Fall and Other: no heavy lifting, otherwise clear of BLT/post op precautions   RED FLAGS: None   WEIGHT BEARING RESTRICTIONS: No  FALLS:  Has patient fallen in  last 6 months? No and FOF (-)  LIVING ENVIRONMENT: Lives with: lives with their family son is there 24/7 for now, visiting from [place]  Lives in: House/apartment Stairs: 4 STE B rails, otherwise one level home  Has following equipment at home: Single point cane and Environmental consultant - 2 wheeled  OCCUPATION: retired- used to work in [employer] schools   PLOF: Independent, Independent with basic ADLs, Independent with gait, and Independent with transfers  PATIENT GOALS: build strength, be able to stand longer for cooking/cleaning/housework  NEXT MD VISIT: Referring 10/17/23  OBJECTIVE:  Note: Objective measures were completed at Evaluation unless otherwise noted.  PATIENT SURVEYS:  Modified Oswestry 38%   COGNITION: Overall cognitive status: Within functional limits for tasks assessed       MUSCLE LENGTH:  HS WNL B Piriformis moderate limitation B Hip flexors moderately tight per skilled observation  POSTURE: rounded shoulders, forward head, decreased lumbar lordosis, decreased thoracic kyphosis, and s/p lumbar surgery     LUMBAR ROM:   AROM eval 10/27/23  Flexion Moderate limitation  25% limited  Extension Extreme limitation  50% limited  Right lateral flexion Severe limitation 75%  Left lateral flexion Severe limitation  75%  Right rotation    Left rotation     (Blank rows = not tested)    LOWER EXTREMITY MMT:    MMT Right eval Left eval R 10/20/23 L 10/20/23  Hip flexion 4- 4- 4 4+  Hip extension      Hip abduction 3+ 3 4+ 4+  Hip adduction      Hip internal rotation      Hip external rotation      Knee flexion 4- 4+ 4+ 4+  Knee extension 4 5 4+ 4+  Ankle dorsiflexion 5 5    Ankle plantarflexion      Ankle inversion      Ankle eversion       (Blank rows = not tested)    FUNCTIONAL TESTS:  5 times sit to stand: 21.6 seconds no UEs  Timed up and go (TUG): 14.4 seconds no device  3 minute walk test: 344 no device, 2 seated rest breaks    GAIT: Distance walked: 39ft Assistive device utilized: None Level of assistance: SBA Comments: hip ER tightness noted, flexed at hips, slightly wide BOS, needed one time MinA for balance when turning   TREATMENT DATE:  10/30/23: Recumbent cycle 6 min, level6 Seated rows, hands vertical and hands horizontal: 20# , 2 sets 15 each B Hamstring curls 25#, 2 x 15 B knee ext 5 #, 2 x 15 Full length lift R shoe,  Provided with 1/8" rubber insert due to pronounced iliac crest difference and ongoing difficulty with maintaining prolonged standing and walking.  She was advised to remove if increased back pain or knee, hip pain. Advised that the insert should provide an easy transition and ease of movement, less pain and prolonged stamina for standing, walking.  2/10/07/23 Nustep L6x67min UE/LE 5xSTS- 15.36 seconds 3 min  walk test: 318 ft rest required after 1 min 30 sec; after test able to walk additional 300 ft Lumbar AROM LE flexibility and assessment of other goals 10/20/23 Therapeutic Exercise: to improve strength and mobility.  Demo, verbal and tactile cues throughout for technique.  Bike L2x58min Knee flexion 10# 2x10 BLE Knee extension 5# 2x10 BLE LE MMT NEUROMUSCULAR RE-EDUCATION: To improve posture, balance, and kinesthesia. Standing heel and toe raises  Church pew x 20 Retro steps x 10 bil Seated rows 15# 3x10  10/16/23 combination of therapeutic activities and neuromuscular re education to engage deep hip stability as well as muscle memory, motor control lower abds. Side lying for clam shells and hip abd, 2# wt on knee, 2 x 10 each Supine for bridges with feet on step, then switched to physioball peanut 3 x 10 Supine dead bugs with legs over peanut to engage TA mass practice Supine legs over peanut for side reaches each arm with deep abdominal cxn mass practice Supine heels on peanut for abdominal set with B knees to chest motion mass practice  10/13/23 Bike L2x72min STM to R  lumbar paraspinals and QL prone Seated lumbar flexion 10x5"; added L side bend 10x5" Standing rows RTB x 10 - cues for scap retraction Standing shoulder ext RTB x 10  Standing pallof press RTB x 10 bil  10/09/23: Therapeutic exercise, and neuromuscular re education to recruit deep stabilizers abdominals and hips to improve activity tolerance for upright tasks:   Nustep L 6 x 6 min UE/LE  Sidelying hip abd with 2# wt over distal thigh, 1 x 10 B Side lying clam shells with 2 # wt distal thigh, 1 x 10 B Supine B heels on 2 AIRex pads, bridging 15 reps Supine small amplitude marches with feet on 2 airex pads, 15 reps Supine reaches each arm to same ankle, in hook lying with feet on 2 airex pads 10 x each  Seated for long arc quads with opposite arm punches, mass practice, cues to engage TA prior to this ex Seated for alt marching with hip ER mass practice , encouraged pt to engage TA with this ex  Seated rows vertical grip, 20#, 2 X 10 Seated rows horizontal grip 20#, 1 x 12  Standing at counter on 6" step, B heel drops to stretch plantarflexors and with heel raises, 15 x  Forward step ups with opposite knee drivers to 6" step with one hand support on counter to engage post chain  10/06/23 Nustep L5x7min UE/LE Supine TrA sets x 10 Supine TrA + march x 10 Bridge 2x10 Supine clams no weight x 10 Seated lat pull 15# 2x10 Seated rows high grips 2x10 15# Standing bird dog at counter 2 x 5 Side steps at counter 10/02/23: Nustep L6, 6 min Sidelying on each side for 10 reps hip abd x 2 sets each Supine for bridging with B heels on 2 airex to engage gluteals, 10 x Clam shells 10 x 2 each leg Standing for heel raises with blue t band around ankles for lateral stability demand 20x Heel raises with ball squeeze between heels 20x Towel slides for/ back each leg for engaging lateral hip musculature Seated rows, 15# 2 x 10 hands vertical and 2 x 10 hands horizontal  09/29/23 Nustep L5x84min  UE/LE Standing heel raises 2 x 10 B Standing hip abduction 2 x 10 B Standing hip extension 2 x 10 B Seated LAQ B 2x10 2# Seated marching 2# 2x10 Sidesteps along counter 4x down  and back  Seated rows and shoulder ext RTB x 10   PATIENT EDUCATION:  Education details: exam findings, POC, HEP  Person educated: Patient Education method: Explanation Education comprehension: verbalized understanding, returned demonstration, and needs further education  HOME EXERCISE PROGRAM: Access Code: [facility] URL: [email or web address] Date: 10/13/2023 Prepared by: Verta Ellen  Exercises - Seated March with Resistance  - 1 x daily - 7 x weekly - 1 sets - 20 reps - Seated Hip Abduction with Resistance  - 1 x daily - 7 x weekly - 1 sets - 10 reps - 1 second  hold - Sit to Stand with Resistance Around Legs  - 1 x daily - 7 x weekly - 2 sets - 5 reps - Standing Hip Abduction with Counter Support  - 1 x daily - 7 x weekly - 2 sets - 10 reps - Standing Hip Extension with Counter Support  - 1 x daily - 7 x weekly - 2 sets - 10 reps - Standing March with Counter Support  - 1 x daily - 7 x weekly - 2 sets - 10 reps - Standing Shoulder Row with Anchored Resistance  - 1 x daily - 7 x weekly - 2 sets - 10 reps - Shoulder Extension with Anchored Resistance  - 1 x daily - 7 x weekly - 2 sets - 10 reps - Standing Anti-Rotation Press with Anchored Resistance  - 1 x daily - 7 x weekly - 2 sets - 10 reps  ASSESSMENT:  CLINICAL IMPRESSION: Pt returned today for her discharge visit following rehabilitation for LS fusion.  She has progressed well, still has standing tolerance issues, so added the shoe insert which improved her posture, and she voiced improved Sx as well. She will be [facility] the senior center to utilize their gym to continue her fitness regamin.  At this point we will discharge from PT.  OBJECTIVE IMPAIRMENTS: Abnormal gait, decreased activity tolerance, decreased balance, decreased  mobility, difficulty walking, decreased strength, postural dysfunction, obesity, and pain.   ACTIVITY LIMITATIONS: carrying, lifting, sitting, standing, sleeping, transfers, bed mobility, and locomotion level  PARTICIPATION LIMITATIONS: meal prep, cleaning, laundry, driving, shopping, community activity, and church  PERSONAL FACTORS: Age, Behavior pattern, Fitness, Past/current experiences, Social background, and Time since onset of injury/illness/exacerbation are also affecting patient's functional outcome.   REHAB POTENTIAL: Good  CLINICAL DECISION MAKING: Stable/uncomplicated  EVALUATION COMPLEXITY: Low   GOALS: Goals reviewed with patient? No  SHORT TERM GOALS: Target date: 10/15/2023    Will be compliant with appropriate progressive HEP  Baseline: Goal status: MET- 10/06/23  2.  Lumbar flexion and lateral flexion AROM to be no more than 25% limited, lumbar extension to be no more than 50% limited  Baseline:  Goal status: PROGRESSING: 10/27/23 met for flexion and extension  3.  Will demonstrate better awareness of functional posture with all task performance  Baseline:  Goal status: 10/16/23: met  4.  Mm flexibility impairment to have improved by 50%  Baseline:  Goal status: PROGRESSING- 10/27/23    LONG TERM GOALS: Target date: 11/12/2023    MMT to have improved by at least one grade in all weak groups  Baseline:  Goal status: PROGRESSING 10/19/22  2.  Will ambulate at least 524ft in no device to show improved mobility and community access  Baseline:  Goal status: PROGRESSING; 10/27/23 see treatment  3.  Will complete 5xSTS in 15 seconds or less to show improved BLE strength and mobility  Baseline:  Goal  status: MET- 15.36 seconds 10/27/23  4.  Will score at least 48 on Berg to show improved functional balance/reduced fall risk  Baseline:  Goal status: MET- 53/56 on visit #2  5.  Will tolerate standing for periods of at least 90 minutes in duration to improve  ability to tolerate and perform home and self care  Baseline:  Goal status: 10/28/23- 15 minutes 10/30/23: 15 min 6.  Oswestry to have improved by at least 15 points to show improved subjective QOL  Baseline:  Goal status: INITIAL 10/30/23: Modified Oswestry Low Back Pain Disability Questionnaire: 14 / 50 = 28.0 %  PLAN:  PT FREQUENCY: 2x/week  PT DURATION: 8 weeks  PLANNED INTERVENTIONS: 97164- PT Re-evaluation, 97110-Therapeutic exercises, 97530- Therapeutic activity, 97112- Neuromuscular re-education, 97535- Self Care, 16109- Manual therapy, [id number]- Gait training, [id number]- Aquatic Therapy, Cryotherapy, and Moist heat.  PLAN FOR NEXT SESSION: need to do oswestry; continued focus on strength, balance, general conditioning, core stability  Devynn Hessler L Nevayah Faust, PT, DPT, OCS 10/30/23 12:09 PM

## 2023-11-10 ENCOUNTER — Telehealth: Payer: Self-pay | Admitting: Family Medicine

## 2023-11-10 NOTE — Telephone Encounter (Signed)
 Copied from CRM [id number]. Topic: Medicare AWV >> Nov 10, 2023 11:54 AM Payton Doughty wrote: Reason for CRM: Called LVM 11/10/2023 to schedule AWV. Please schedule office or virtual visits.  Verlee Rossetti; Care Guide Ambulatory Clinical Support [place]o l [facility] Direct Dial: [phone]

## 2023-12-03 ENCOUNTER — Other Ambulatory Visit: Payer: Self-pay | Admitting: Family Medicine

## 2023-12-15 ENCOUNTER — Other Ambulatory Visit: Payer: Self-pay | Admitting: Internal Medicine

## 2023-12-15 ENCOUNTER — Other Ambulatory Visit: Payer: Self-pay | Admitting: Family Medicine

## 2023-12-31 ENCOUNTER — Telehealth: Payer: Self-pay | Admitting: Family Medicine

## 2023-12-31 NOTE — Telephone Encounter (Signed)
 Copied from CRM [id number]. Topic: Medicare AWV >> Dec 31, 2023  9:08 AM Juliana Ocean wrote: Reason for CRM: N/A 12/30/2023 to schedule AWV. Please schedule Virtual or Telehealth visits ONLY.   Rosalee Collins; Care Guide Ambulatory Clinical Support Short l [facility] Direct Dial: [phone]

## 2024-01-12 DIAGNOSIS — H53042 Amblyopia suspect, left eye: Secondary | ICD-10-CM | POA: Diagnosis not present

## 2024-01-12 DIAGNOSIS — H43811 Vitreous degeneration, right eye: Secondary | ICD-10-CM | POA: Diagnosis not present

## 2024-01-12 DIAGNOSIS — H52223 Regular astigmatism, bilateral: Secondary | ICD-10-CM | POA: Diagnosis not present

## 2024-01-12 DIAGNOSIS — H04123 Dry eye syndrome of bilateral lacrimal glands: Secondary | ICD-10-CM | POA: Diagnosis not present

## 2024-01-12 DIAGNOSIS — E119 Type 2 diabetes mellitus without complications: Secondary | ICD-10-CM | POA: Diagnosis not present

## 2024-01-12 DIAGNOSIS — H524 Presbyopia: Secondary | ICD-10-CM | POA: Diagnosis not present

## 2024-01-12 DIAGNOSIS — H25813 Combined forms of age-related cataract, bilateral: Secondary | ICD-10-CM | POA: Diagnosis not present

## 2024-01-12 LAB — HM DIABETES EYE EXAM

## 2024-02-03 ENCOUNTER — Other Ambulatory Visit (HOSPITAL_BASED_OUTPATIENT_CLINIC_OR_DEPARTMENT_OTHER): Payer: Self-pay | Admitting: Family Medicine

## 2024-02-03 DIAGNOSIS — Z1231 Encounter for screening mammogram for malignant neoplasm of breast: Secondary | ICD-10-CM

## 2024-02-09 ENCOUNTER — Ambulatory Visit: Payer: Self-pay | Admitting: Family Medicine

## 2024-02-09 ENCOUNTER — Encounter: Payer: Self-pay | Admitting: Family Medicine

## 2024-02-09 ENCOUNTER — Ambulatory Visit (INDEPENDENT_AMBULATORY_CARE_PROVIDER_SITE_OTHER): Payer: Medicare PPO | Admitting: Family Medicine

## 2024-02-09 ENCOUNTER — Ambulatory Visit (HOSPITAL_BASED_OUTPATIENT_CLINIC_OR_DEPARTMENT_OTHER)
Admission: RE | Admit: 2024-02-09 | Discharge: 2024-02-09 | Disposition: A | Discharge status: Home / Self Care | Source: Ambulatory Visit | Attending: Family Medicine | Admitting: Family Medicine

## 2024-02-09 ENCOUNTER — Encounter (HOSPITAL_BASED_OUTPATIENT_CLINIC_OR_DEPARTMENT_OTHER): Payer: Self-pay

## 2024-02-09 VITALS — BP 126/78 | HR 91 | Temp 98.0°F | Resp 16 | Ht 66.0 in | Wt 234.8 lb

## 2024-02-09 DIAGNOSIS — Z0000 Encounter for general adult medical examination without abnormal findings: Secondary | ICD-10-CM | POA: Diagnosis not present

## 2024-02-09 DIAGNOSIS — Z1231 Encounter for screening mammogram for malignant neoplasm of breast: Secondary | ICD-10-CM | POA: Diagnosis not present

## 2024-02-09 DIAGNOSIS — E669 Obesity, unspecified: Secondary | ICD-10-CM

## 2024-02-09 DIAGNOSIS — E1169 Type 2 diabetes mellitus with other specified complication: Secondary | ICD-10-CM | POA: Diagnosis not present

## 2024-02-09 DIAGNOSIS — E65 Localized adiposity: Secondary | ICD-10-CM | POA: Diagnosis not present

## 2024-02-09 LAB — COMPREHENSIVE METABOLIC PANEL WITH GFR
ALT: 13 U/L (ref 0–35)
AST: 11 U/L (ref 0–37)
Albumin: 3.9 g/dL (ref 3.5–5.2)
Alkaline Phosphatase: 109 U/L (ref 39–117)
BUN: 21 mg/dL (ref 6–23)
CO2: 26 meq/L (ref 19–32)
Calcium: 9 mg/dL (ref 8.4–10.5)
Chloride: 106 meq/L (ref 96–112)
Creatinine, Ser: 1.01 mg/dL (ref 0.40–1.20)
GFR: 56.19 mL/min — ABNORMAL LOW (ref >60.00)
Glucose, Bld: 112 mg/dL — ABNORMAL HIGH (ref 70–99)
Potassium: 3.9 meq/L (ref 3.5–5.1)
Sodium: 141 meq/L (ref 135–145)
Total Bilirubin: 0.3 mg/dL (ref 0.2–1.2)
Total Protein: 7.1 g/dL (ref 6.0–8.3)

## 2024-02-09 LAB — CBC
HCT: 35.1 % — ABNORMAL LOW (ref 36.0–46.0)
Hemoglobin: 11.3 g/dL — ABNORMAL LOW (ref 12.0–15.0)
MCHC: 32.2 g/dL (ref 30.0–36.0)
MCV: 86.3 fl (ref 78.0–100.0)
Platelets: 238 10*3/uL (ref 150.0–400.0)
RBC: 4.07 Mil/uL (ref 3.87–5.11)
RDW: 15.7 % — ABNORMAL HIGH (ref 11.5–15.5)
WBC: 7.5 10*3/uL (ref 4.0–10.5)

## 2024-02-09 LAB — LIPID PANEL
Cholesterol: 96 mg/dL (ref 0–200)
HDL: 35.7 mg/dL — ABNORMAL LOW (ref >39.00)
LDL Cholesterol: 44 mg/dL (ref 0–99)
NonHDL: 60.15
Total CHOL/HDL Ratio: 3
Triglycerides: 79 mg/dL (ref 0.0–149.0)
VLDL: 15.8 mg/dL (ref 0.0–40.0)

## 2024-02-09 LAB — MICROALBUMIN / CREATININE URINE RATIO
Creatinine,U: 178.3 mg/dL
Microalb Creat Ratio: 10.5 mg/g (ref 0.0–30.0)
Microalb, Ur: 1.9 mg/dL (ref 0.0–1.9)

## 2024-02-09 LAB — HEMOGLOBIN A1C: Hgb A1c MFr Bld: 6.6 % — ABNORMAL HIGH (ref 4.6–6.5)

## 2024-02-09 NOTE — Patient Instructions (Addendum)
 Give us  2-3 business days to get the results of your labs back.   Keep the diet clean and stay active.  Please sched your eye exam.   If you do not hear anything about your referral in the next 1-2 weeks, call our office and ask for an update.  OK to use Debrox (peroxide) in the ear to loosen up wax. Also recommend using a bulb syringe (for removing boogers from baby's noses) to flush through warm water and vinegar (3-4:1 ratio). An alternative, though more expensive, is an elephant ear washer wax removal kit. Do not use Q-tips as this can impact wax further.  Let us  know if you need anything.

## 2024-02-09 NOTE — Progress Notes (Signed)
 Chief Complaint  Patient presents with   Annual Exam    CPE     Well Woman Sharon Yu is here for a complete physical.   Her last physical was >1 year ago.  Current diet: in general, a "healthy" diet. Current exercise: was lifting wts, none recently. Weight is intentionally down a few lbs and she denies daytime fatigue. Seatbelt? Yes Advanced directive? Yes  Health Maintenance Colonoscopy- Yes Shingrix- Yes DEXA- Yes Mammogram- Yes Tetanus- Yes Pneumonia- Yes Hep C screen- Yes  Past Medical History:  Diagnosis Date   Arthritis    Back pain    buldging disc   CHF (congestive heart failure) (HCC)    Chronic back pain    Diabetes mellitus without complication (HCC)    Hyperlipidemia    takes Crestor daily   Hypertension    takes Maxzide  and Metoprolol  daily along with Ramipril    Hypopotassemia    takes Potassium pill daily   Joint pain    Joint swelling    Nonischemic cardiomyopathy (HCC)    a. 07/2017: EF reduced to 25-30%, cath showing normal cors.    Shortness of breath    with exertion     Past Surgical History:  Procedure Laterality Date   bilateral knee arthroscopies     carpel tunnel     bilateral   CHOLECYSTECTOMY     colonscopy     JOINT REPLACEMENT Left    RIGHT/LEFT HEART CATH AND CORONARY ANGIOGRAPHY N/A 07/17/2017   Procedure: RIGHT/LEFT HEART CATH AND CORONARY ANGIOGRAPHY;  Surgeon: Swaziland, Peter M, MD;  Location: [facility] INVASIVE CV LAB;  Service: Cardiovascular;  Laterality: N/A;   TOTAL KNEE ARTHROPLASTY Right 02/19/2013   Procedure: TOTAL KNEE ARTHROPLASTY;  Surgeon: Forbes Ida., MD;  Location: [facility] OR;  Service: Orthopedics;  Laterality: Right;  right total knee arthroplasty   TOTAL KNEE REVISION Left 07/02/2013   Procedure: LEFT TOTAL KNEE TIBIA REVISION;  Surgeon: Ilean Mall, MD;  Location: [facility] OR;  Service: Orthopedics;  Laterality: Left;   TRANSFORAMINAL LUMBAR INTERBODY FUSION (TLIF) WITH PEDICLE SCREW FIXATION 2 LEVEL Right  06/04/2023   Procedure: RIGHT-SIDED LUMBAR THREE- LUMBAR FOUR, LUMBAR FOUR- LUMBAR FIVE TRANSFORAMINAL LUMBAR INTERBODY FUSION AND DECOMPRESSION WITH INSTRUMENTATION AND ALLOGRAFT;  Surgeon: Virl Grimes, MD;  Location: [facility] OR;  Service: Orthopedics;  Laterality: Right;    Medications  Current Outpatient Medications on File Prior to Visit  Medication Sig Dispense Refill   alendronate  (FOSAMAX ) 70 MG tablet TAKE 1 TABLET BY MOUTH ONCE A WEEK.  TAKE  WITH  A  FULL  GLASS  OF  WATER  ON  AN  EMPTY  STOMACH. 12 tablet 0   allopurinol  (ZYLOPRIM ) 100 MG tablet Take 1 tablet (100 mg total) by mouth daily. 180 tablet 0   ascorbic acid  (VITAMIN C ) 500 MG tablet Take 500 mg by mouth daily.     aspirin  81 MG chewable tablet Chew 81 mg daily by mouth.     atorvastatin  (LIPITOR ) 40 MG tablet Take 1 tablet (40 mg total) by mouth daily. 90 tablet 3   carvedilol  (COREG ) 25 MG tablet TAKE 1 TABLET BY MOUTH TWICE DAILY WITH A MEAL 180 tablet 2   fluticasone  (FLONASE ) 50 MCG/ACT nasal spray Use 2 spray(s) in each nostril once daily 16 g 0   furosemide  (LASIX ) 40 MG tablet Take 1 tablet by mouth once daily 90 tablet 0   hydrALAZINE  (APRESOLINE ) 25 MG tablet Take 1 tablet by mouth twice daily  180 tablet 2   meloxicam  (MOBIC ) 15 MG tablet TAKE 1 TABLET BY MOUTH ONCE DAILY AS NEEDED FOR PAIN 30 tablet 0   metFORMIN  (GLUCOPHAGE ) 500 MG tablet Take 1 tablet (500 mg total) by mouth daily with breakfast. 90 tablet 3   methocarbamol  (ROBAXIN -750) 750 MG tablet Take 1 tablet (750 mg total) by mouth every 6 (six) hours as needed for muscle spasms. 30 tablet 2   Multiple Vitamin (MULITIVITAMIN WITH MINERALS) TABS Take 1 tablet by mouth daily. Omega XL Joint and bone with fish oil     potassium gluconate 595 (99 K) MG TABS tablet Take 595 mg by mouth daily.     RESTASIS  0.05 % ophthalmic emulsion Place 1 drop into both eyes 3 (three) times a week.     sacubitril -valsartan  (ENTRESTO ) 97-103 MG Take 1 tablet by mouth twice  daily 180 tablet 2   spironolactone  (ALDACTONE ) 25 MG tablet Take 1 tablet by mouth once daily 90 tablet 2   Vitamin D3 (VITAMIN D ) 25 MCG tablet Take 1,000 Units by mouth daily.     vitamin E  180 MG (400 UNITS) capsule Take 400 Units by mouth daily.      Allergies Allergies  Allergen Reactions   Apple Hives and Swelling    A Mixed Blend Apple   Other Hives and Swelling    "Grapple" (hybrid apple & grape)    Review of Systems: Constitutional:  no fevers Eye:  no recent significant change in vision Ears:  No changes in hearing Nose/Mouth/Throat:  no complaints of nasal congestion, no sore throat Cardiovascular: no chest pain Respiratory:  No shortness of breath Gastrointestinal:  No change in bowel habits GU:  Female: negative for dysuria Integumentary:  no abnormal skin lesions reported Neurologic:  no headaches Endocrine:  denies unexplained weight changes  Exam BP 126/78 (BP Location: Left Arm, Patient Position: Sitting)   Pulse 91   Temp 98 F (36.7 C) (Oral)   Resp 16   Ht 5\' 6"  (1.676 m)   Wt 234 lb 12.8 oz (106.5 kg)   SpO2 98%   BMI 37.90 kg/m  General:  well developed, well nourished, in no apparent distress Skin:  no significant moles, warts, or growths Head:  no masses, lesions, or tenderness Eyes:  pupils equal and round, sclera anicteric without injection Ears: 100% obstructed with cerumen bilaterally Nose:  nares patent, mucosa normal, and no drainage Throat/Pharynx:  lips and gingiva without lesion; tongue and uvula midline; non-inflamed pharynx; no exudates or postnasal drainage Neck: neck supple without adenopathy, thyromegaly, or masses Lungs:  clear to auscultation, breath sounds equal bilaterally, no respiratory distress Cardio:  regular rate and rhythm, no bruits or LE edema Abdomen:  abdomen soft, nontender; bowel sounds normal; no masses or organomegaly Genital: Deferred Neuro:  gait normal; deep tendon reflexes normal and symmetric Psych:  well oriented with normal range of affect and appropriate judgment/insight  Assessment and Plan  Well adult exam  Type 2 diabetes mellitus with obesity (HCC) - Plan: CBC, Comprehensive metabolic panel with GFR, Hemoglobin A1c, Microalbumin / creatinine urine ratio, Lipid panel  Fat deposits - Plan: Ambulatory referral to Plastic Surgery   Well 73 y.o. female. Counseled on diet and exercise. Other orders as above. Flushed ears today. Refer back to plastic surgery at her request. Follow up in 6 mo. The patient voiced understanding and agreement to the plan.  Shellie Dials Bradshaw, DO 02/09/24 9:54 AM

## 2024-02-10 ENCOUNTER — Other Ambulatory Visit: Payer: Self-pay

## 2024-02-10 ENCOUNTER — Ambulatory Visit

## 2024-02-10 ENCOUNTER — Ambulatory Visit (INDEPENDENT_AMBULATORY_CARE_PROVIDER_SITE_OTHER)

## 2024-02-10 VITALS — Ht 66.0 in | Wt 241.0 lb

## 2024-02-10 DIAGNOSIS — D649 Anemia, unspecified: Secondary | ICD-10-CM | POA: Diagnosis not present

## 2024-02-10 DIAGNOSIS — Z0000 Encounter for general adult medical examination without abnormal findings: Secondary | ICD-10-CM | POA: Diagnosis not present

## 2024-02-10 NOTE — Progress Notes (Signed)
 Subjective:   Sharon Yu is a 73 y.o. who presents for a Medicare Wellness preventive visit.  As a reminder, Annual Wellness Visits don't include a physical exam, and some assessments may be limited, especially if this visit is performed virtually. We may recommend an in-person follow-up visit with your provider if needed.  Visit Complete: Virtual I connected with  Sharon Yu on 02/10/24 by a audio enabled telemedicine application and verified that I am speaking with the correct person using two identifiers.  Patient Location: Home  Provider Location: Home Office  I discussed the limitations of evaluation and management by telemedicine. The patient expressed understanding and agreed to proceed.  Vital Signs: Because this visit was a virtual/telehealth visit, some criteria may be missing or patient reported. Any vitals not documented were not able to be obtained and vitals that have been documented are patient reported.  VideoDeclined- This patient declined Librarian, academic. Therefore the visit was completed with audio only.  Persons Participating in Visit: Patient.  AWV Questionnaire: No: Patient Medicare AWV questionnaire was not completed prior to this visit.  Cardiac Risk Factors include: advanced age (>41men, >22 women);diabetes mellitus;dyslipidemia;hypertension     Objective:     Today's Vitals   02/10/24 1017  Weight: 241 lb (109.3 kg)  Height: 5\' 6"  (1.676 m)   Body mass index is 38.9 kg/m.     02/10/2024   10:27 AM 09/17/2023   11:05 AM 05/22/2023    1:19 PM 01/01/2023    1:39 PM 11/03/2022    6:36 AM 12/27/2021   10:24 AM 12/20/2020   10:30 AM  Advanced Directives  Does Patient Have a Medical Advance Directive? Yes Yes Yes Yes No Yes No  Type of Estate agent of [place];Living will Healthcare Power of [place];Living will Living will Healthcare Power of [place];Living will  Healthcare Power of  [place];Out of facility DNR (pink MOST or yellow form);Living will   Does patient want to make changes to medical advance directive? No - Patient declined No - Patient declined No - Guardian declined No - Patient declined     Copy of Healthcare Power of Attorney in Chart? Yes - validated most recent copy scanned in chart (See row information) No - copy requested No - copy requested Yes - validated most recent copy scanned in chart (See row information)  Yes - validated most recent copy scanned in chart (See row information)   Would patient like information on creating a medical advance directive?     No - Patient declined  Yes (MAU/Ambulatory/Procedural Areas - Information given)    Current Medications (verified) Outpatient Encounter Medications as of 02/10/2024  Medication Sig   alendronate  (FOSAMAX ) 70 MG tablet TAKE 1 TABLET BY MOUTH ONCE A WEEK.  TAKE  WITH  A  FULL  GLASS  OF  WATER  ON  AN  EMPTY  STOMACH.   allopurinol  (ZYLOPRIM ) 100 MG tablet Take 1 tablet (100 mg total) by mouth daily.   ascorbic acid  (VITAMIN C ) 500 MG tablet Take 500 mg by mouth daily.   aspirin  81 MG chewable tablet Chew 81 mg daily by mouth.   atorvastatin  (LIPITOR ) 40 MG tablet Take 1 tablet (40 mg total) by mouth daily.   carvedilol  (COREG ) 25 MG tablet TAKE 1 TABLET BY MOUTH TWICE DAILY WITH A MEAL   fluticasone  (FLONASE ) 50 MCG/ACT nasal spray Use 2 spray(s) in each nostril once daily   furosemide  (LASIX ) 40 MG tablet Take  1 tablet by mouth once daily   hydrALAZINE  (APRESOLINE ) 25 MG tablet Take 1 tablet by mouth twice daily   meloxicam  (MOBIC ) 15 MG tablet TAKE 1 TABLET BY MOUTH ONCE DAILY AS NEEDED FOR PAIN   metFORMIN  (GLUCOPHAGE ) 500 MG tablet Take 1 tablet (500 mg total) by mouth daily with breakfast.   methocarbamol  (ROBAXIN -750) 750 MG tablet Take 1 tablet (750 mg total) by mouth every 6 (six) hours as needed for muscle spasms.   Multiple Vitamin (MULITIVITAMIN WITH MINERALS) TABS Take 1 tablet by mouth  daily. Omega XL Joint and bone with fish oil   potassium gluconate 595 (99 K) MG TABS tablet Take 595 mg by mouth daily.   RESTASIS  0.05 % ophthalmic emulsion Place 1 drop into both eyes 3 (three) times a week.   sacubitril -valsartan  (ENTRESTO ) 97-103 MG Take 1 tablet by mouth twice daily   spironolactone  (ALDACTONE ) 25 MG tablet Take 1 tablet by mouth once daily   Vitamin D3 (VITAMIN D ) 25 MCG tablet Take 1,000 Units by mouth daily.   vitamin E  180 MG (400 UNITS) capsule Take 400 Units by mouth daily.   No facility-administered encounter medications on file as of 02/10/2024.    Allergies (verified) Apple and Other   History: Past Medical History:  Diagnosis Date   Arthritis    Back pain    buldging disc   CHF (congestive heart failure) (HCC)    Chronic back pain    Diabetes mellitus without complication (HCC)    Hyperlipidemia    takes Crestor daily   Hypertension    takes Maxzide  and Metoprolol  daily along with Ramipril    Hypopotassemia    takes Potassium pill daily   Joint pain    Joint swelling    Nonischemic cardiomyopathy (HCC)    a. 07/2017: EF reduced to 25-30%, cath showing normal cors.    Shortness of breath    with exertion   Past Surgical History:  Procedure Laterality Date   bilateral knee arthroscopies     carpel tunnel     bilateral   CHOLECYSTECTOMY     colonscopy     JOINT REPLACEMENT Left    RIGHT/LEFT HEART CATH AND CORONARY ANGIOGRAPHY N/A 07/17/2017   Procedure: RIGHT/LEFT HEART CATH AND CORONARY ANGIOGRAPHY;  Surgeon: [place], Peter M, MD;  Location: [facility] INVASIVE CV LAB;  Service: Cardiovascular;  Laterality: N/A;   TOTAL KNEE ARTHROPLASTY Right 02/19/2013   Procedure: TOTAL KNEE ARTHROPLASTY;  Surgeon: Forbes Ida., MD;  Location: [facility] OR;  Service: Orthopedics;  Laterality: Right;  right total knee arthroplasty   TOTAL KNEE REVISION Left 07/02/2013   Procedure: LEFT TOTAL KNEE TIBIA REVISION;  Surgeon: Ilean Mall, MD;  Location: [facility] OR;   Service: Orthopedics;  Laterality: Left;   TRANSFORAMINAL LUMBAR INTERBODY FUSION (TLIF) WITH PEDICLE SCREW FIXATION 2 LEVEL Right 06/04/2023   Procedure: RIGHT-SIDED LUMBAR THREE- LUMBAR FOUR, LUMBAR FOUR- LUMBAR FIVE TRANSFORAMINAL LUMBAR INTERBODY FUSION AND DECOMPRESSION WITH INSTRUMENTATION AND ALLOGRAFT;  Surgeon: Virl Grimes, MD;  Location: [facility] OR;  Service: Orthopedics;  Laterality: Right;   Family History  Problem Relation Age of Onset   Hypertension Mother    Arthritis Mother    Asthma Mother    Diabetes Mother    Hyperlipidemia Mother    Cancer Mother        uterine cancer   CAD Sister    Heart failure Sister    Hyperlipidemia Sister    Heart disease Sister    Early death  Father    Asthma Brother    COPD Brother    Depression Brother    AAA (abdominal aortic aneurysm) Daughter    Hearing loss Daughter    Early death Daughter 66       Spinal miningitis   Social History   Socioeconomic History   Marital status: Widowed    Spouse name: Not on file   Number of children: Not on file   Years of education: Not on file   Highest education level: Not on file  Occupational History   Occupation: retired  Tobacco Use   Smoking status: Never   Smokeless tobacco: Never  Substance and Sexual Activity   Alcohol use: No   Drug use: No   Sexual activity: Yes    Birth control/protection: Post-menopausal  Other Topics Concern   Not on file  Social History Narrative   Not on file   Social Drivers of Health   Financial Resource Strain: Low Risk  (02/10/2024)   Overall Financial Resource Strain (CARDIA)    Difficulty of Paying Living Expenses: Not hard at all  Food Insecurity: No Food Insecurity (02/10/2024)   Hunger Vital Sign    Worried About Running Out of Food in the Last Year: Never true    Ran Out of Food in the Last Year: Never true  Transportation Needs: No Transportation Needs (02/10/2024)   PRAPARE - Administrator, Civil Service (Medical): No     Lack of Transportation (Non-Medical): No  Physical Activity: Insufficiently Active (02/10/2024)   Exercise Vital Sign    Days of Exercise per Week: 3 days    Minutes of Exercise per Session: 30 min  Stress: No Stress Concern Present (02/10/2024)   [employer] of Occupational Health - Occupational Stress Questionnaire    Feeling of Stress : Not at all  Social Connections: Moderately Integrated (02/10/2024)   Social Connection and Isolation Panel [NHANES]    Frequency of Communication with Friends and Family: More than three times a week    Frequency of Social Gatherings with Friends and Family: More than three times a week    Attends Religious Services: More than 4 times per year    Active Member of [employer] or Organizations: Yes    Attends Banker Meetings: More than 4 times per year    Marital Status: Widowed    Tobacco Counseling Counseling given: Not Answered    Clinical Intake:  Pre-visit preparation completed: Yes  Pain : No/denies pain     Diabetes: Yes CBG done?: No Did pt. bring in CBG monitor from home?: No  Lab Results  Component Value Date   HGBA1C 6.6 (H) 02/09/2024   HGBA1C 6.4 08/11/2023   HGBA1C 6.2 (H) 05/22/2023     How often do you need to have someone help you when you read instructions, pamphlets, or other written materials from your doctor or pharmacy?: 1 - Never  Interpreter Needed?: No  Information entered by :: Seabron Cypress LPN   Activities of Daily Living     02/10/2024   10:27 AM 05/22/2023    1:27 PM  In your present state of health, do you have any difficulty performing the following activities:  Hearing? 0   Vision? 0   Difficulty concentrating or making decisions? 0   Walking or climbing stairs? 1   Dressing or bathing? 0   Doing errands, shopping? 0 0  Preparing Food and eating ? N   Using the Toilet? N  In the past six months, have you accidently leaked urine? N   Do you have problems with loss of bowel  control? N   Managing your Medications? N   Managing your Finances? N   Housekeeping or managing your Housekeeping? N     Patient Care Team: Jobe Mulder, DO as PCP - General (Family Medicine) Maximo Spar Aviva Lemmings, MD as PCP - Cardiology (Cardiology)  I have updated your Care Teams any recent Medical Services you may have received from other providers in the past year.     Assessment:    This is a routine wellness examination for [facility].  Hearing/Vision screen Hearing Screening - Comments:: Denies hearing difficulties   Vision Screening - Comments:: Wears rx glasses - up to date with routine eye exams with [facility]    Goals Addressed             This Visit's Progress    Patient Stated   On track    Drink more water        Depression Screen     02/10/2024   10:26 AM 02/09/2024    9:51 AM 01/01/2023    1:43 PM 08/06/2022    9:38 AM 02/04/2022    9:42 AM 12/27/2021   10:26 AM 01/24/2021    8:49 AM  PHQ 2/9 Scores  PHQ - 2 Score 0 0 1 1 0 1 0  PHQ- 9 Score    1 2      Fall Risk     02/10/2024   10:27 AM 08/11/2023   10:23 AM 01/01/2023    1:42 PM 09/17/2022   11:30 AM 08/06/2022    9:38 AM  Fall Risk   Falls in the past year? 0 0 0 0 0  Number falls in past yr: 0 0 0 0 0  Injury with Fall? 0 0 0 0 0  Risk for fall due to : Impaired mobility;Impaired balance/gait  No Fall Risks No Fall Risks No Fall Risks  Follow up Falls prevention discussed;Education provided;Falls evaluation completed Falls evaluation completed Falls evaluation completed  Falls evaluation completed    MEDICARE RISK AT HOME:  Medicare Risk at Home Any stairs in or around the home?: No If so, are there any without handrails?: No Home free of loose throw rugs in walkways, pet beds, electrical cords, etc?: Yes Adequate lighting in your home to reduce risk of falls?: Yes Life alert?: No Use of a cane, walker or w/c?: Yes Grab bars in the bathroom?: Yes Shower chair or bench in  shower?: No Elevated toilet seat or a handicapped toilet?: Yes  TIMED UP AND GO:  Was the test performed?  No  Cognitive Function: 6CIT completed        02/10/2024   10:27 AM 01/01/2023    1:50 PM 12/27/2021   10:37 AM  6CIT Screen  What Year? 0 points 0 points 0 points  What month? 0 points 0 points 0 points  What time? 0 points 0 points 0 points  Count back from 20 0 points 0 points 0 points  Months in reverse 0 points 0 points 0 points  Repeat phrase 0 points 0 points 0 points  Total Score 0 points 0 points 0 points    Immunizations Immunization History  Administered Date(s) Administered   Fluad Quad(high Dose 65+) 05/31/2020   Influenza, High Dose Seasonal PF 05/24/2023   Influenza-Unspecified 05/13/2019, 05/31/2021, 05/19/2022   Moderna Covid-19 Fall Seasonal Vaccine 69yrs &  older 05/24/2023   PFIZER(Purple Top)SARS-COV-2 Vaccination 10/07/2019, 10/28/2019, 06/02/2020, 01/17/2021, 06/03/2021   PNEUMOCOCCAL CONJUGATE-20 01/17/2022   Pfizer Covid-19 Vaccine Bivalent Booster 40yrs & up 06/03/2022   Pneumococcal Conjugate-13 04/27/2018   Pneumococcal Polysaccharide-23 06/09/2014, 10/15/2018   Tdap 10/15/2018, 01/17/2022   Zoster Recombinant(Shingrix) 06/09/2017, 06/09/2018    Screening Tests Health Maintenance  Topic Date Due   OPHTHALMOLOGY EXAM  01/09/2024   COVID-19 Vaccine (8 - Pfizer risk 2024-25 season) 08/10/2024 (Originally 11/21/2023)   INFLUENZA VACCINE  04/02/2024   HEMOGLOBIN A1C  08/10/2024   MAMMOGRAM  01/19/2025   Diabetic kidney evaluation - eGFR measurement  02/08/2025   Diabetic kidney evaluation - Urine ACR  02/08/2025   FOOT EXAM  02/08/2025   Medicare Annual Wellness (AWV)  02/09/2025   Colonoscopy  05/01/2027   DTaP/Tdap/Td (3 - Td or Tdap) 01/18/2032   Pneumonia Vaccine 27+ Years old  Completed   DEXA SCAN  Completed   Hepatitis C Screening  Completed   Zoster Vaccines- Shingrix  Completed   HPV VACCINES  Aged Out   Meningococcal B  Vaccine  Aged Out    Health Maintenance  Health Maintenance Due  Topic Date Due   OPHTHALMOLOGY EXAM  01/09/2024    Additional Screening:  Vision Screening: Recommended annual ophthalmology exams for early detection of glaucoma and other disorders of the eye. Would you like a referral to an eye doctor? No    Dental Screening: Recommended annual dental exams for proper oral hygiene  Community Resource Referral / Chronic Care Management: CRR required this visit?  No   CCM required this visit?  No   Plan:    I have personally reviewed and noted the following in the patient's chart:   Medical and social history Use of alcohol, tobacco or illicit drugs  Current medications and supplements including opioid prescriptions. Patient is not currently taking opioid prescriptions. Functional ability and status Nutritional status Physical activity Advanced directives List of other physicians Hospitalizations, surgeries, and ER visits in previous 12 months Vitals Screenings to include cognitive, depression, and falls Referrals and appointments  In addition, I have reviewed and discussed with patient certain preventive protocols, quality metrics, and best practice recommendations. A written personalized care plan for preventive services as well as general preventive health recommendations were provided to patient.   Seabron Cypress [place], [place]   [id number]/[id number]   After Visit Summary: (MyChart) Due to this being a telephonic visit, the after visit summary with patients personalized plan was offered to patient via MyChart   Notes: Nothing significant to report at this time.

## 2024-02-10 NOTE — Patient Instructions (Signed)
 Ms. Scripter , Thank you for taking time out of your busy schedule to complete your Annual Wellness Visit with me. I enjoyed our conversation and look forward to speaking with you again next year. I, as well as your care team,  appreciate your ongoing commitment to your health goals. Please review the following plan we discussed and let me know if I can assist you in the future. Your Game plan/ To Do List    Follow up Visits: Next Medicare AWV with our clinical staff: In 1 year    Have you seen your provider in the last 6 months (3 months if uncontrolled diabetes)? Yes Next Office Visit with your provider: 08/11/24 @ 9:30  Clinician Recommendations:  Aim for 30 minutes of exercise or brisk walking, 6-8 glasses of water, and 5 servings of fruits and vegetables each day.       This is a list of the screening recommended for you and due dates:  Health Maintenance  Topic Date Due   Eye exam for diabetics  01/09/2024   COVID-19 Vaccine (8 - Pfizer risk 2024-25 season) 08/10/2024*   Flu Shot  04/02/2024   Hemoglobin A1C  08/10/2024   Mammogram  01/19/2025   Yearly kidney function blood test for diabetes  02/08/2025   Yearly kidney health urinalysis for diabetes  02/08/2025   Complete foot exam   02/08/2025   Medicare Annual Wellness Visit  02/09/2025   Colon Cancer Screening  05/01/2027   DTaP/Tdap/Td vaccine (3 - Td or Tdap) 01/18/2032   Pneumonia Vaccine  Completed   DEXA scan (bone density measurement)  Completed   Hepatitis C Screening  Completed   Zoster (Shingles) Vaccine  Completed   HPV Vaccine  Aged Out   Meningitis B Vaccine  Aged Out  *Topic was postponed. The date shown is not the original due date.    Advanced directives: (ACP Link)Information on Advanced Care Planning can be found at [place]  Secretary of [facility] Advance Health Care Directives Advance Health Care Directives. [email or web address]   Advance Care Planning is important because it:  [x]  Makes sure you receive the  medical care that is consistent with your values, goals, and preferences  [x]  It provides guidance to your family and loved ones and reduces their decisional burden about whether or not they are making the right decisions based on your wishes.  Follow the link provided in your after visit summary or read over the paperwork we have mailed to you to help you started getting your Advance Directives in place. If you need assistance in completing these, please reach out to us  so that we can help you!  See attachments for Preventive Care and Fall Prevention Tips.

## 2024-02-11 ENCOUNTER — Ambulatory Visit: Payer: Self-pay | Admitting: Family Medicine

## 2024-02-11 LAB — IRON,TIBC AND FERRITIN PANEL
%SAT: 22 % (ref 16–45)
Ferritin: 84 ng/mL (ref 16–288)
Iron: 58 ug/dL (ref 45–160)
TIBC: 267 ug/dL (ref 250–450)

## 2024-03-01 ENCOUNTER — Telehealth: Payer: Self-pay | Admitting: *Deleted

## 2024-03-01 ENCOUNTER — Telehealth: Payer: Self-pay

## 2024-03-01 ENCOUNTER — Encounter: Payer: Self-pay | Admitting: Plastic Surgery

## 2024-03-01 ENCOUNTER — Ambulatory Visit: Admitting: Plastic Surgery

## 2024-03-01 VITALS — BP 120/76 | HR 81 | Ht 66.0 in | Wt 233.0 lb

## 2024-03-01 DIAGNOSIS — E65 Localized adiposity: Secondary | ICD-10-CM

## 2024-03-01 DIAGNOSIS — Q831 Accessory breast: Secondary | ICD-10-CM

## 2024-03-01 DIAGNOSIS — L987 Excessive and redundant skin and subcutaneous tissue: Secondary | ICD-10-CM

## 2024-03-01 NOTE — Progress Notes (Signed)
 Referring Provider Frann Mabel Mt, DO [address] [place],  [place] [address]   CC:  Chief Complaint  Patient presents with   Advice Only   Skin Problem      Sharon Yu is an 73 y.o. female.  HPI: Sharon Yu is a 73 year old female furred today for evaluation and management of excess tissue in the bilateral axillas left greater than right.  Patient states that while she has always been big as she has lost weight on her medically managed weight loss program the tissue in her axilla seems to have gotten worse.  This hangs over her lateral bra straps and is quite uncomfortable when she puts her arms down.  Of note the patient has significant medical issues including a history of congestive heart failure a thoracic aneurysm diabetes and most recently mild anemia.  Last hemoglobin A1c was 6.6.  She did have back surgery recently and developed swelling in the right leg postoperatively.  This was felt to be neuropathic and has since resolved.  Allergies  Allergen Reactions   Apple Hives and Swelling    A Mixed Blend Apple   Other Hives and Swelling    Grapple (hybrid apple & grape)    Outpatient Encounter Medications as of 03/01/2024  Medication Sig   alendronate  (FOSAMAX ) 70 MG tablet TAKE 1 TABLET BY MOUTH ONCE A WEEK.  TAKE  WITH  A  FULL  GLASS  OF  WATER  ON  AN  EMPTY  STOMACH.   allopurinol  (ZYLOPRIM ) 100 MG tablet Take 1 tablet (100 mg total) by mouth daily.   ascorbic acid  (VITAMIN C ) 500 MG tablet Take 500 mg by mouth daily.   aspirin  81 MG chewable tablet Chew 81 mg daily by mouth.   atorvastatin  (LIPITOR ) 40 MG tablet Take 1 tablet (40 mg total) by mouth daily.   carvedilol  (COREG ) 25 MG tablet TAKE 1 TABLET BY MOUTH TWICE DAILY WITH A MEAL   fluticasone  (FLONASE ) 50 MCG/ACT nasal spray Use 2 spray(s) in each nostril once daily   furosemide  (LASIX ) 40 MG tablet Take 1 tablet by mouth once daily   hydrALAZINE  (APRESOLINE ) 25 MG tablet Take 1  tablet by mouth twice daily   meloxicam  (MOBIC ) 15 MG tablet TAKE 1 TABLET BY MOUTH ONCE DAILY AS NEEDED FOR PAIN   metFORMIN  (GLUCOPHAGE ) 500 MG tablet Take 1 tablet (500 mg total) by mouth daily with breakfast.   methocarbamol  (ROBAXIN -750) 750 MG tablet Take 1 tablet (750 mg total) by mouth every 6 (six) hours as needed for muscle spasms.   Multiple Vitamin (MULITIVITAMIN WITH MINERALS) TABS Take 1 tablet by mouth daily. Omega XL Joint and bone with fish oil   potassium gluconate 595 (99 K) MG TABS tablet Take 595 mg by mouth daily.   RESTASIS  0.05 % ophthalmic emulsion Place 1 drop into both eyes 3 (three) times a week.   sacubitril -valsartan  (ENTRESTO ) 97-103 MG Take 1 tablet by mouth twice daily   spironolactone  (ALDACTONE ) 25 MG tablet Take 1 tablet by mouth once daily   Vitamin D3 (VITAMIN D ) 25 MCG tablet Take 1,000 Units by mouth daily.   vitamin E  180 MG (400 UNITS) capsule Take 400 Units by mouth daily.   No facility-administered encounter medications on file as of 03/01/2024.     Past Medical History:  Diagnosis Date   Arthritis    Back pain    buldging disc   CHF (congestive heart failure) (HCC)  Chronic back pain    Diabetes mellitus without complication (HCC)    Hyperlipidemia    takes Crestor daily   Hypertension    takes Maxzide  and Metoprolol  daily along with Ramipril    Hypopotassemia    takes Potassium pill daily   Joint pain    Joint swelling    Nonischemic cardiomyopathy (HCC)    a. 07/2017: EF reduced to 25-30%, cath showing normal cors.    Shortness of breath    with exertion    Past Surgical History:  Procedure Laterality Date   bilateral knee arthroscopies     carpel tunnel     bilateral   CHOLECYSTECTOMY     colonscopy     JOINT REPLACEMENT Left    RIGHT/LEFT HEART CATH AND CORONARY ANGIOGRAPHY N/A 07/17/2017   Procedure: RIGHT/LEFT HEART CATH AND CORONARY ANGIOGRAPHY;  Surgeon: Swaziland, Peter M, MD;  Location: [facility] INVASIVE CV LAB;  Service:  Cardiovascular;  Laterality: N/A;   TOTAL KNEE ARTHROPLASTY Right 02/19/2013   Procedure: TOTAL KNEE ARTHROPLASTY;  Surgeon: LELON JONETTA Shari Mickey., MD;  Location: [facility] OR;  Service: Orthopedics;  Laterality: Right;  right total knee arthroplasty   TOTAL KNEE REVISION Left 07/02/2013   Procedure: LEFT TOTAL KNEE TIBIA REVISION;  Surgeon: Dempsey JINNY Sensor, MD;  Location: [facility] OR;  Service: Orthopedics;  Laterality: Left;   TRANSFORAMINAL LUMBAR INTERBODY FUSION (TLIF) WITH PEDICLE SCREW FIXATION 2 LEVEL Right 06/04/2023   Procedure: RIGHT-SIDED LUMBAR THREE- LUMBAR FOUR, LUMBAR FOUR- LUMBAR FIVE TRANSFORAMINAL LUMBAR INTERBODY FUSION AND DECOMPRESSION WITH INSTRUMENTATION AND ALLOGRAFT;  Surgeon: Beuford Anes, MD;  Location: [facility] OR;  Service: Orthopedics;  Laterality: Right;    Family History  Problem Relation Age of Onset   Hypertension Mother    Arthritis Mother    Asthma Mother    Diabetes Mother    Hyperlipidemia Mother    Cancer Mother        uterine cancer   CAD Sister    Heart failure Sister    Hyperlipidemia Sister    Heart disease Sister    Early death Father    Asthma Brother    COPD Brother    Depression Brother    AAA (abdominal aortic aneurysm) Daughter    Hearing loss Daughter    Early death Daughter 59       Spinal miningitis    Social History   Social History Narrative   Not on file     Review of Systems General: Denies fevers, chills, weight loss CV: Denies chest pain, shortness of breath, palpitations Axillas: Excess tissue which hangs over the lateral bra straps and is painful when she puts her arms down  Physical Exam    03/01/2024   11:12 AM 02/10/2024   10:17 AM 02/09/2024    9:36 AM  Vitals with BMI  Height 5' 6 5' 6 5' 6  Weight 233 lbs 241 lbs 234 lbs 13 oz  BMI 37.63 38.92 37.92  Systolic 120 -- 126  Diastolic 76 -- 78  Pulse 81  91    General:  No acute distress,  Alert and oriented, Non-Toxic, Normal speech and affect Axillas: Patient has excess  and redundant tissue in the axillas.  Is difficult to tell if this is adipose tissue or this is axillary breast tissue.  Given the fact that she does not have large rules such as this in any other area I suspect that it is probably axillary breast tissue. Mammogram: June 2025 BI-RADS 1 Assessment/Plan Excess tissue,  bilateral axillas: I suspect that this is probably axillary breast tissue.  The size of the tissue hangs over the bra straps and interferes with abduction of the arms.  The tissue is firm with no dominant masses.  It would not be unreasonable to surgically excise this.  I discussed the procedure at length with the patient.  She understands that if we do proceed with this that she will have incisions through the axilla and that she will likely have drains postoperatively.  She will not have a flat axilla as this will not be possible to achieve but I should be able to provide her with some symptomatic relief.  The tissue will be sent to pathology for examination.  We discussed the risks of bleeding, infection, seroma formation.  Prior to scheduling surgery she will need cardiac clearance to ensure that this is safe.  Additionally she will need clearance from her primary care provider and her hemoglobin A1c to be 6.2 or less.  Ideally her hemoglobin and hematocrit will be in the normal range as well.  All questions were answered to her satisfaction.  Photographs were obtained today with her consent.  Will schedule her for excision of bilateral axillary tissue at her request and if cleared by cardiology.  Leonce KATHEE Birmingham 03/01/2024, 11:33 AM

## 2024-03-01 NOTE — Telephone Encounter (Signed)
 Faxed request for Surgical Clearance to patients PCP and Cardiologist:  PCP: Dr. Mabel Pry Phone: [phone] Fax: [phone]  Cardiologist: Dr. Mona Phone: [phone] Fax: [phone]  Confirmation of receipt received.

## 2024-03-01 NOTE — Telephone Encounter (Signed)
   Name: Sharon Yu  DOB: Feb 10, 1951  MRN: [id number]  Primary Cardiologist: Vinie JAYSON Maxcy, MD   Preoperative team, please contact this patient and set up a phone call appointment for further preoperative risk assessment. Please obtain consent and complete medication review. Thank you for your help.  I confirm that guidance regarding antiplatelet and oral anticoagulation therapy has been completed and, if necessary, noted below.  Patient can hold ASA 81 mg 5 days prior to and 5 days post procedure per protocol.  I also confirmed the patient resides in the state of [place] . As per [facility] Medical Board telemedicine laws, the patient must reside in the state in which the provider is licensed.   Wyn Raddle, Jackee Shove, NP 03/01/2024, 3:33 PM [facility]

## 2024-03-01 NOTE — Telephone Encounter (Signed)
  Patient Consent for Virtual Visit         Sharon Yu has provided verbal consent on 03/01/2024 for a virtual visit (video or telephone).   CONSENT FOR VIRTUAL VISIT FOR:  Sharon Yu  By participating in this virtual visit I agree to the following:  I hereby voluntarily request, consent and authorize [facility] and its employed or contracted physicians, physician assistants, nurse practitioners or other licensed health care professionals (the Practitioner), to provide me with telemedicine health care services (the "Services) as deemed necessary by the treating Practitioner. I acknowledge and consent to receive the Services by the Practitioner via telemedicine. I understand that the telemedicine visit will involve communicating with the Practitioner through live audiovisual communication technology and the disclosure of certain medical information by electronic transmission. I acknowledge that I have been given the opportunity to request an in-person assessment or other available alternative prior to the telemedicine visit and am voluntarily participating in the telemedicine visit.  I understand that I have the right to withhold or withdraw my consent to the use of telemedicine in the course of my care at any time, without affecting my right to future care or treatment, and that the Practitioner or I may terminate the telemedicine visit at any time. I understand that I have the right to inspect all information obtained and/or recorded in the course of the telemedicine visit and may receive copies of available information for a reasonable fee.  I understand that some of the potential risks of receiving the Services via telemedicine include:  Delay or interruption in medical evaluation due to technological equipment failure or disruption; Information transmitted may not be sufficient (e.g. poor resolution of images) to allow for appropriate medical decision making by the  Practitioner; and/or  In rare instances, security protocols could fail, causing a breach of personal health information.  Furthermore, I acknowledge that it is my responsibility to provide information about my medical history, conditions and care that is complete and accurate to the best of my ability. I acknowledge that Practitioner's advice, recommendations, and/or decision may be based on factors not within their control, such as incomplete or inaccurate data provided by me or distortions of diagnostic images or specimens that may result from electronic transmissions. I understand that the practice of medicine is not an exact science and that Practitioner makes no warranties or guarantees regarding treatment outcomes. I acknowledge that a copy of this consent can be made available to me via my patient portal [facility] MyChart), or I can request a printed copy by calling the office of [facility].    I understand that my insurance will be billed for this visit.   I have read or had this consent read to me. I understand the contents of this consent, which adequately explains the benefits and risks of the Services being provided via telemedicine.  I have been provided ample opportunity to ask questions regarding this consent and the Services and have had my questions answered to my satisfaction. I give my informed consent for the services to be provided through the use of telemedicine in my medical care

## 2024-03-01 NOTE — Telephone Encounter (Signed)
   Pre-operative Risk Assessment    Patient Name: Sharon Yu  DOB: [birth year] MRN: [id number]   Date of last office visit: 07/29/23 DR. HILTY Date of next office visit: NONE   Request for Surgical Clearance    Procedure:  B/L EXCISION OF AXILLARY TISSUE  Date of Surgery:  Clearance TBD                                Surgeon:  DR. LEONCE BIRMINGHAM Surgeon's Group or Practice Name:  [facility] Phone number:  [phone] Fax number:  [phone]   Type of Clearance Requested:   - Medical  - Pharmacy:  Hold Aspirin  x 5 DAYS PRIOR AND 5 DAYS POST OP   Type of Anesthesia:  General    Additional requests/questions:    Bonney Niels Jest   03/01/2024, 3:20 PM

## 2024-03-02 ENCOUNTER — Other Ambulatory Visit (INDEPENDENT_AMBULATORY_CARE_PROVIDER_SITE_OTHER)

## 2024-03-02 ENCOUNTER — Other Ambulatory Visit: Payer: Self-pay

## 2024-03-02 DIAGNOSIS — D649 Anemia, unspecified: Secondary | ICD-10-CM

## 2024-03-02 LAB — CBC WITH DIFFERENTIAL/PLATELET
Absolute Lymphocytes: 2105 {cells}/uL (ref 850–3900)
Absolute Monocytes: 623 {cells}/uL (ref 200–950)
Basophils Absolute: 30 {cells}/uL (ref 0–200)
Basophils Relative: 0.4 %
Eosinophils Absolute: 99 {cells}/uL (ref 15–500)
Eosinophils Relative: 1.3 %
HCT: 36.5 % (ref 35.0–45.0)
Hemoglobin: 11.4 g/dL — ABNORMAL LOW (ref 11.7–15.5)
MCH: 27.9 pg (ref 27.0–33.0)
MCHC: 31.2 g/dL — ABNORMAL LOW (ref 32.0–36.0)
MCV: 89.2 fL (ref 80.0–100.0)
MPV: 10.8 fL (ref 7.5–12.5)
Monocytes Relative: 8.2 %
Neutro Abs: 4742 {cells}/uL (ref 1500–7800)
Neutrophils Relative %: 62.4 %
Platelets: 217 10*3/uL (ref 140–400)
RBC: 4.09 10*6/uL (ref 3.80–5.10)
RDW: 14.5 % (ref 11.0–15.0)
Total Lymphocyte: 27.7 %
WBC: 7.6 10*3/uL (ref 3.8–10.8)

## 2024-03-03 ENCOUNTER — Telehealth: Payer: Self-pay

## 2024-03-03 NOTE — Telephone Encounter (Signed)
 Called pt was advised she would need appt for  Pre-OP, appt scheduled.

## 2024-03-08 ENCOUNTER — Encounter: Payer: Self-pay | Admitting: Family Medicine

## 2024-03-08 ENCOUNTER — Ambulatory Visit: Admitting: Family Medicine

## 2024-03-08 ENCOUNTER — Ambulatory Visit: Payer: Self-pay | Admitting: Family Medicine

## 2024-03-08 VITALS — BP 128/80 | HR 67 | Temp 98.0°F | Resp 16 | Ht 66.0 in | Wt 233.0 lb

## 2024-03-08 DIAGNOSIS — M7501 Adhesive capsulitis of right shoulder: Secondary | ICD-10-CM | POA: Diagnosis not present

## 2024-03-08 DIAGNOSIS — R718 Other abnormality of red blood cells: Secondary | ICD-10-CM

## 2024-03-08 DIAGNOSIS — Z01818 Encounter for other preprocedural examination: Secondary | ICD-10-CM | POA: Diagnosis not present

## 2024-03-08 DIAGNOSIS — D649 Anemia, unspecified: Secondary | ICD-10-CM

## 2024-03-08 LAB — B12 AND FOLATE PANEL
Folate: 9.1 ng/mL (ref >5.9)
Vitamin B-12: >1500 pg/mL — ABNORMAL HIGH (ref 211–911)

## 2024-03-08 MED ORDER — METHYLPREDNISOLONE ACETATE 40 MG/ML IJ SUSP
40.0000 mg | Freq: Once | INTRAMUSCULAR | Status: AC
  Administered 2024-03-08: 40 mg via INTRAMUSCULAR

## 2024-03-08 NOTE — Patient Instructions (Addendum)
 Heat (pad or rice pillow in microwave) over affected area, 10-15 minutes twice daily.   Ice/cold pack over area for 10-15 min twice daily.  OK to take Tylenol  1000 mg (2 extra strength tabs) or 975 mg (3 regular strength tabs) every 6 hours as needed.  Send me a message in 1 month if not significantly improved with your shoulder.   Let us  know if you need anything.  EXERCISES  RANGE OF MOTION (ROM) AND STRETCHING EXERCISES These exercises may help you when beginning to rehabilitate your injury. While completing these exercises, remember:  Restoring tissue flexibility helps normal motion to return to the joints. This allows healthier, less painful movement and activity. An effective stretch should be held for at least 30 seconds. A stretch should never be painful. You should only feel a gentle lengthening or release in the stretched tissue.  ROM - Pendulum Bend at the waist so that your right / left arm falls away from your body. Support yourself with your opposite hand on a solid surface, such as a table or a countertop. Your right / left arm should be perpendicular to the ground. If it is not perpendicular, you need to lean over farther. Relax the muscles in your right / left arm and shoulder as much as possible. Gently sway your hips and trunk so they move your right / left arm without any use of your right / left shoulder muscles. Progress your movements so that your right / left arm moves side to side, then forward and backward, and finally, both clockwise and counterclockwise. Complete 10-15 repetitions in each direction. Many people use this exercise to relieve discomfort in their shoulder as well as to gain range of motion. Repeat 2 times. Complete this exercise 3 times per week.  STRETCH - Flexion, Standing Stand with good posture. With an underhand grip on your right / left hand and an overhand grip on the opposite hand, grasp a broomstick or cane so that your hands are a little  more than shoulder-width apart. Keeping your right / left elbow straight and shoulder muscles relaxed, push the stick with your opposite hand to raise your right / left arm in front of your body and then overhead. Raise your arm until you feel a stretch in your right / left shoulder, but before you have increased shoulder pain. Try to avoid shrugging your right / left shoulder as your arm rises by keeping your shoulder blade tucked down and toward your mid-back spine. Hold 30 seconds. Slowly return to the starting position. Repeat 2 times. Complete this exercise 3 times per week.  STRETCH - Internal Rotation Place your right / left hand behind your back, palm-up. Throw a towel or belt over your opposite shoulder. Grasp the towel/belt with your right / left hand. While keeping an upright posture, gently pull up on the towel/belt until you feel a stretch in the front of your right / left shoulder. Avoid shrugging your right / left shoulder as your arm rises by keeping your shoulder blade tucked down and toward your mid-back spine. Hold 30. Release the stretch by lowering your opposite hand. Repeat 2 times. Complete this exercise 3 times per week.  STRETCH - External Rotation and Abduction Stagger your stance through a doorframe. It does not matter which foot is forward. As instructed by your physician, physical therapist or athletic trainer, place your hands: And forearms above your head and on the door frame. And forearms at head-height and on the door  frame. At elbow-height and on the door frame. Keeping your head and chest upright and your stomach muscles tight to prevent over-extending your low-back, slowly shift your weight onto your front foot until you feel a stretch across your chest and/or in the front of your shoulders. Hold 30 seconds. Shift your weight to your back foot to release the stretch. Repeat 2 times. Complete this stretch 3 times per week.   STRENGTHENING EXERCISES  These  exercises may help you when beginning to rehabilitate your injury. They may resolve your symptoms with or without further involvement from your physician, physical therapist or athletic trainer. While completing these exercises, remember:  Muscles can gain both the endurance and the strength needed for everyday activities through controlled exercises. Complete these exercises as instructed by your physician, physical therapist or athletic trainer. Progress the resistance and repetitions only as guided. You may experience muscle soreness or fatigue, but the pain or discomfort you are trying to eliminate should never worsen during these exercises. If this pain does worsen, stop and make certain you are following the directions exactly. If the pain is still present after adjustments, discontinue the exercise until you can discuss the trouble with your clinician. If advised by your physician, during your recovery, avoid activity or exercises which involve actions that place your right / left hand or elbow above your head or behind your back or head. These positions stress the tissues which are trying to heal.  STRENGTH - Scapular Depression and Adduction With good posture, sit on a firm chair. Supported your arms in front of you with pillows, arm rests or a table top. Have your elbows in line with the sides of your body. Gently draw your shoulder blades down and toward your mid-back spine. Gradually increase the tension without tensing the muscles along the top of your shoulders and the back of your neck. Hold for 3 seconds. Slowly release the tension and relax your muscles completely before completing the next repetition. After you have practiced this exercise, remove the arm support and complete it in standing as well as sitting. Repeat 2 times. Complete this exercise 3 times per week.   STRENGTH - External Rotators Secure a rubber exercise band/tubing to a fixed object so that it is at the same height  as your right / left elbow when you are standing or sitting on a firm surface. Stand or sit so that the secured exercise band/tubing is at your side that is not injured. Bend your elbow 90 degrees. Place a folded towel or small pillow under your right / left arm so that your elbow is a few inches away from your side. Keeping the tension on the exercise band/tubing, pull it away from your body, as if pivoting on your elbow. Be sure to keep your body steady so that the movement is only coming from your shoulder rotating. Hold 3 seconds. Release the tension in a controlled manner as you return to the starting position. Repeat 2 times. Complete this exercise 3 times per week.   STRENGTH - Supraspinatus Stand or sit with good posture. Grasp a 2-3 lb weight or an exercise band/tubing so that your hand is thumbs-up, like when you shake hands. Slowly lift your right / left hand from your thigh into the air, traveling about 30 degrees from straight out at your side. Lift your hand to shoulder height or as far as you can without increasing any shoulder pain. Initially, many people do not lift their hands above  shoulder height. Avoid shrugging your right / left shoulder as your arm rises by keeping your shoulder blade tucked down and toward your mid-back spine. Hold for 3 seconds. Control the descent of your hand as you slowly return to your starting position. Repeat 2 times. Complete this exercise 3 times per week.   STRENGTH - Shoulder Extensors Secure a rubber exercise band/tubing so that it is at the height of your shoulders when you are either standing or sitting on a firm arm-less chair. With a thumbs-up grip, grasp an end of the band/tubing in each hand. Straighten your elbows and lift your hands straight in front of you at shoulder height. Step back away from the secured end of band/tubing until it becomes tense. Squeezing your shoulder blades together, pull your hands down to the sides of your  thighs. Do not allow your hands to go behind you. Hold for 3 seconds. Slowly ease the tension on the band/tubing as you reverse the directions and return to the starting position. Repeat 2 times. Complete this exercise 3 times per week.   STRENGTH - Scapular Retractors Secure a rubber exercise band/tubing so that it is at the height of your shoulders when you are either standing or sitting on a firm arm-less chair. With a palm-down grip, grasp an end of the band/tubing in each hand. Straighten your elbows and lift your hands straight in front of you at shoulder height. Step back away from the secured end of band/tubing until it becomes tense. Squeezing your shoulder blades together, draw your elbows back as you bend them. Keep your upper arm lifted away from your body throughout the exercise. Hold 3 seconds. Slowly ease the tension on the band/tubing as you reverse the directions and return to the starting position. Repeat 2 times. Complete this exercise 3 times per week.  STRENGTH - Scapular Depressors Find a sturdy chair without wheels, such as a from a dining room table. Keeping your feet on the floor, lift your bottom from the seat and lock your elbows. Keeping your elbows straight, allow gravity to pull your body weight down. Your shoulders will rise toward your ears. Raise your body against gravity by drawing your shoulder blades down your back, shortening the distance between your shoulders and ears. Although your feet should always maintain contact with the floor, your feet should progressively support less body weight as you get stronger. Hold 3 seconds. In a controlled and slow manner, lower your body weight to begin the next repetition. Repeat 2 times. Complete this exercise 3 times per week.    This information is not intended to replace advice given to you by your health care provider. Make sure you discuss any questions you have with your health care provider.   Document Released:  07/03/2005 Document Revised: 09/09/2014 Document Reviewed: 12/01/2008 [employer] Interactive Patient Education [employer]

## 2024-03-08 NOTE — Progress Notes (Signed)
 Subjective:   Chief Complaint  Patient presents with   Pre-op Exam    Pre-Op-Exam    Sharon Yu  is here for a Pre-operative physical at the request of Dr. Waddell.   She  is having b/l excision of axillary tissue surgery for cosmetic regions.   Personal or family hx of adverse outcome to anesthesia? No  Chipped, cracked, missing, or loose teeth? Not in the front.  Decreased ROM of neck? No  Able to walk up 2 flights of stairs without becoming significantly short of breath or having chest pain? Yes   Revised Goldman Criteria: High Risk Surgery (intraperitoneal, intrathoracic, aortic): No  Ischemic heart disease (Prior MI, +excercise stress test, angina, nitrate use, Qwave): No  History of heart failure: No History of cerebrovascular disease: No  History of diabetes: No  Insulin therapy for DM: No  Preoperative Cr >2.0: No   R shoulder pain Going on for 8-9 mo. She had back surgery and it affected her sleeping and this flared her shoulder. Aching in nature. Decreased ROM and some swelling. No bruising, redness. Waxes and wanes in severity. Notices pain at top of shoulder, cannot touch.   Patient Active Problem List   Diagnosis Date Noted   Adhesive capsulitis of right shoulder 03/08/2024   Chronic gout of left foot 08/11/2023   Radiculopathy of lumbar region 06/04/2023   Age-related osteoporosis without current pathological fracture 08/03/2021   Chronic bilateral low back pain without sciatica 07/19/2020   Nonischemic cardiomyopathy (HCC) 07/31/2017   Type 2 diabetes mellitus with hyperglycemia, without long-term current use of insulin (HCC)    Morbid (severe) obesity due to excess calories (HCC)    Chronic combined systolic and diastolic CHF (congestive heart failure) (HCC) 07/15/2017   Ascending aortic aneurysm (HCC)    Essential hypertension 07/25/2013   Hyperlipidemia LDL goal <100 07/25/2013   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 07/2017: EF reduced to 25-30%, cath showing normal cors.     Past Surgical History:  Procedure Laterality Date   bilateral knee arthroscopies     carpel tunnel     bilateral   CHOLECYSTECTOMY     colonscopy     JOINT REPLACEMENT Left    RIGHT/LEFT HEART CATH AND CORONARY ANGIOGRAPHY N/A 07/17/2017   Procedure: RIGHT/LEFT HEART CATH AND CORONARY ANGIOGRAPHY;  Surgeon: Swaziland, Peter M, MD;  Location: [facility] INVASIVE CV LAB;  Service: Cardiovascular;  Laterality: N/A;   TOTAL KNEE ARTHROPLASTY Right 02/19/2013   Procedure: TOTAL KNEE ARTHROPLASTY;  Surgeon: LELON JONETTA Shari Mickey., MD;  Location: [facility] OR;  Service: Orthopedics;  Laterality: Right;  right total knee arthroplasty   TOTAL KNEE REVISION Left 07/02/2013   Procedure: LEFT TOTAL KNEE TIBIA REVISION;  Surgeon: Dempsey JINNY Sensor, MD;  Location: [facility] OR;  Service: Orthopedics;  Laterality: Left;   TRANSFORAMINAL LUMBAR INTERBODY FUSION (TLIF) WITH PEDICLE SCREW FIXATION 2 LEVEL Right 06/04/2023   Procedure: RIGHT-SIDED LUMBAR THREE- LUMBAR FOUR, LUMBAR FOUR-  LUMBAR FIVE TRANSFORAMINAL LUMBAR INTERBODY FUSION AND DECOMPRESSION WITH INSTRUMENTATION AND ALLOGRAFT;  Surgeon: Beuford Anes, MD;  Location: [facility] OR;  Service: Orthopedics;  Laterality: Right;    Current Outpatient Medications  Medication Sig Dispense Refill   alendronate  (FOSAMAX ) 70 MG tablet TAKE 1 TABLET BY MOUTH ONCE A WEEK.  TAKE  WITH  A  FULL  GLASS  OF  WATER  ON  AN  EMPTY  STOMACH. 12 tablet 0   allopurinol  (ZYLOPRIM ) 100 MG tablet Take 1 tablet (100 mg total) by mouth daily. 180 tablet  0   ascorbic acid  (VITAMIN C ) 500 MG tablet Take 500 mg by mouth daily.     aspirin  81 MG chewable tablet Chew 81 mg daily by mouth.     atorvastatin  (LIPITOR ) 40 MG tablet Take 1 tablet (40 mg total) by mouth daily. 90 tablet 3   carvedilol  (COREG ) 25 MG tablet TAKE 1 TABLET BY MOUTH TWICE DAILY WITH A MEAL 180 tablet 2   fluticasone  (FLONASE ) 50 MCG/ACT nasal spray Use 2 spray(s) in each nostril once daily 16 g 0   furosemide  (LASIX ) 40 MG tablet Take 1 tablet by mouth once daily 90 tablet 0   hydrALAZINE  (APRESOLINE ) 25 MG tablet Take 1 tablet by mouth twice daily 180 tablet 2   meloxicam  (MOBIC ) 15 MG tablet TAKE 1 TABLET BY MOUTH ONCE DAILY AS NEEDED FOR PAIN 30 tablet 0   metFORMIN  (GLUCOPHAGE ) 500 MG tablet Take 1 tablet (500 mg total) by mouth daily with breakfast. 90 tablet 3   methocarbamol  (ROBAXIN -750) 750 MG tablet Take 1 tablet (750 mg total) by mouth every 6 (six) hours as needed for muscle spasms. 30 tablet 2   Multiple Vitamin (MULITIVITAMIN WITH MINERALS) TABS Take 1 tablet by mouth daily. Omega XL Joint and bone with fish oil     potassium gluconate 595 (99 K) MG TABS tablet Take 595 mg by mouth daily.     RESTASIS  0.05 % ophthalmic emulsion Place 1 drop into both eyes 3 (three) times a week.     sacubitril -valsartan  (ENTRESTO ) 97-103 MG Take 1 tablet by mouth twice daily 180 tablet 2   spironolactone  (ALDACTONE ) 25 MG tablet Take 1 tablet by mouth once daily 90 tablet 2   Vitamin D3 (VITAMIN D ) 25 MCG tablet Take 1,000 Units by mouth daily.     vitamin E  180 MG (400 UNITS) capsule Take 400 Units by mouth daily.     Allergies  Allergen Reactions   Apple Hives and Swelling    A Mixed Blend Apple   Other Hives and Swelling    Grapple (hybrid apple & grape)    Family History  Problem Relation Age of Onset   Hypertension Mother    Arthritis Mother    Asthma Mother    Diabetes Mother    Hyperlipidemia Mother    Cancer Mother        uterine cancer   CAD Sister     Heart failure Sister    Hyperlipidemia Sister    Heart disease Sister    Early death Father    Asthma Brother    COPD Brother    Depression Brother    AAA (abdominal aortic aneurysm) Daughter    Hearing loss Daughter    Early death Daughter 47       Spinal miningitis     Review of Systems:  Constitutional:  no fevers Eye:  no recent significant change in vision Ear:  no hearing loss Nose/Mouth/Throat:  No dental complaints Neck/Thyroid:  no lumps or masses Pulmonary:  No shortness of breath Cardiovascular:  no chest pain Gastrointestinal:  no abdominal pain GU:  negative for dysuria Musculoskeletal/Extremities: +R shoulder pain, no new pain Skin/Integumentary ROS:  no abnormal skin lesions reported Neurologic:  no HA   Objective:   Vitals:   03/08/24 1013  BP: 128/80  Pulse: 67  Resp: 16  Temp: 98 F (36.7 C)  TempSrc: Oral  SpO2: 95%  Weight: 233 lb (105.7 kg)  Height: 5' 6 (1.676 m)   Body mass index is 37.61 kg/m.  General:  well developed, well nourished, in no apparent distress Skin:  warm, no pallor or diaphoresis Head:  normocephalic, atraumatic Eyes:  pupils equal and round, sclera anicteric without injection Ears:  canals without lesions, TMs shiny without retraction, no obvious effusion, no erythema Throat/Pharynx:  lips and gingiva without lesion; tongue and uvula midline; non-inflamed pharynx; no exudates or postnasal drainage Neck: neck supple without adenopathy, thyromegaly, or masses, no bruits, no jugular venous distention Lungs:  clear to auscultation, breath sounds equal bilaterally, no respiratory distress Cardio:  regular rate and rhythm without murmurs Abdomen:  abdomen soft, nontender; bowel sounds normal; no masses, hepatomegaly or splenomegaly Musculoskeletal: Decreased active and passive range of motion of the right shoulder, no TTP, no erythema, edema, deformity. + Neer's, empty can; negative speeds, crossover, liftoff, O'Brien's.   Symmetrical muscle groups noted without atrophy or deformity Extremities:  no clubbing, cyanosis, or edema, no deformities, no skin discoloration Neuro:  gait normal; deep tendon reflexes normal and symmetric and alert and oriented to person, place, and time Psych: Age appropriate judgment and insight; normal mood  Procedure Note; Shoulder joint injection Consent obtained. The area was palpated, an area was marked posterior to the acromion process approximately 2 cm inferiorly, and cleaned with alcohol x1. A 27-gauge needle, while aiming towards the coracoid process, was used to enter the joint posteriorally with ease. 40 mg of Kenalog with 2 mL of 1% lidocaine  was injected. The patient tolerated the procedure well. There were no complications noted.    Assessment:   Preop examination  Adhesive capsulitis of right shoulder - Plan: PR ARTHROCENTESIS ASPIR&/INJ MAJOR JT/BURSA W/O US , methylPREDNISolone  acetate (DEPO-MEDROL ) injection 40 mg  Low hemoglobin - Plan: B12 and Folate Panel, B12 and Folate Panel   Plan:   She is low risk for the proposed procedure. Hold ASA 5 d prior to procedure, will defer to surgeon upon when to resume. Chronic issue that is not currently controlled.  Injection today.  Stretches and exercises provided.  Heat, ice, Tylenol .  She will send a message if no significant improvement in the next 3 to 4 weeks and we will consider physical therapy. Follow-up on abnormal labs. The patient voiced understanding and agreement to the plan.  Mabel Mt Vancleave, DO 03/08/24  12:17 PM

## 2024-03-10 ENCOUNTER — Ambulatory Visit: Attending: Cardiovascular Disease | Admitting: Nurse Practitioner

## 2024-03-10 DIAGNOSIS — Z01810 Encounter for preprocedural cardiovascular examination: Secondary | ICD-10-CM

## 2024-03-10 NOTE — Progress Notes (Signed)
 Virtual Visit via Telephone Note   Because of Sharon Yu co-morbid illnesses, she is at least at moderate risk for complications without adequate follow up.  This format is felt to be most appropriate for this patient at this time.  Due to technical limitations with video connection Web designer), today's appointment will be conducted as an audio only telehealth visit, and Sharon Yu verbally agreed to proceed in this manner.   All issues noted in this document were discussed and addressed.  No physical exam could be performed with this format.  Evaluation Performed:  Preoperative cardiovascular risk assessment _____________   Date:  03/10/2024   Patient ID:  Sharon Yu, DOB 03-29-1951, MRN [id number] Patient Location:  Home Provider location:   Office  Primary Care Provider:  Frann Mabel Mt, DO Primary Cardiologist:  Vinie JAYSON Maxcy, MD  Chief Complaint / Patient Profile   73 y.o. y/o female with a h/o chronic systolic heart failure, NICM, hypertension, hyperlipidemia, prediabetes, and obesity who is pending  B/L EXCISION OF AXILLARY TISSUE with Dr. Leonce Birmingham of [facility] surgery specialists and presents today for telephonic preoperative cardiovascular risk assessment.  History of Present Illness    Sharon Yu is a 74 y.o. female who presents via audio/video conferencing for a telehealth visit today.  Pt was last seen in cardiology clinic on 07/29/2023 by Dr. Maxcy.  At that time Ronal Jenkins Sierra was doing well.  The patient is now pending procedure as outlined above. Since her last visit, she has been stable from a cardiac standpoint.   She denies chest pain, palpitations, dyspnea, pnd, orthopnea, n, v, dizziness, syncope, edema, weight gain, or early satiety. All other systems reviewed and are otherwise negative except as noted above.   Past Medical History    Past Medical History:  Diagnosis Date   Arthritis    Back pain    buldging disc    CHF (congestive heart failure) (HCC)    Chronic back pain    Diabetes mellitus without complication (HCC)    Hyperlipidemia    takes Crestor daily   Hypertension    takes Maxzide  and Metoprolol  daily along with Ramipril    Hypopotassemia    takes Potassium pill daily   Joint pain    Nonischemic cardiomyopathy (HCC)    a. 07/2017: EF reduced to 25-30%, cath showing normal cors.    Past Surgical History:  Procedure Laterality Date   bilateral knee arthroscopies     carpel tunnel     bilateral   CHOLECYSTECTOMY     colonscopy     JOINT REPLACEMENT Left    RIGHT/LEFT HEART CATH AND CORONARY ANGIOGRAPHY N/A 07/17/2017   Procedure: RIGHT/LEFT HEART CATH AND CORONARY ANGIOGRAPHY;  Surgeon: Swaziland, Peter M, MD;  [facility] INVAS[facility] CV LAB;  Service: Cardiovascular;  Laterality: N/A;   TOTAL KNEE ARTHROPLASTY Right 02/19/2013   Procedure: TOTAL KNEE ARTHROPLASTY;  Surgeon: LELON JONETTA Shari Mickey., MD;  Location: [facility] OR;  Service: Orthopedics;  Laterality: Right;  right total knee arthroplasty   TOTAL KNEE REVISION Left 07/02/2013   Procedure: LEFT TOTAL KNEE TIBIA REVISION;  Surgeon: Dempsey JINNY Sensor, MD;  Location: [facility] OR;  Service: Orthopedics;  Laterality: Left;   TRANSFORAMINAL LUMBAR INTERBODY FUSION (TLIF) WITH PEDICLE SCREW FIXATION 2 LEVEL Right 06/04/2023   Procedure: RIGHT-SIDED LUMBAR THREE- LUMBAR FOUR, LUMBAR FOUR- LUMBAR FIVE TRANSFORAMINAL LUMBAR INTERBODY FUSION AND DECOMPRESSION WITH INSTRUMENTATION AND ALLOGRAFT;  Surgeon: Beuford Anes, MD;  Location: [facility] OR;  Service: Orthopedics;  Laterality: Right;    Allergies  Allergies  Allergen Reactions   Apple Hives and Swelling    A Mixed Blend Apple   Other Hives and Swelling    Grapple (hybrid apple & grape)    Home Medications    Prior to Admission medications   Medication Sig Start Date End Date Taking? Authorizing Provider  alendronate  (FOSAMAX ) 70 MG tablet TAKE 1 TABLET BY MOUTH ONCE A WEEK.  TAKE  WITH  A  FULL  GLASS   OF  WATER  ON  AN  EMPTY  STOMACH. 12/15/23   Frann Mabel Mt, DO  allopurinol  (ZYLOPRIM ) 100 MG tablet Take 1 tablet (100 mg total) by mouth daily. 12/03/23   Frann Mabel Mt, DO  ascorbic acid  (VITAMIN C ) 500 MG tablet Take 500 mg by mouth daily.    [provider]  aspirin  81 MG chewable tablet Chew 81 mg daily by mouth.    [provider]  atorvastatin  (LIPITOR ) 40 MG tablet Take 1 tablet (40 mg total) by mouth daily. 02/07/23   Frann Mabel Mt, DO  carvedilol  (COREG ) 25 MG tablet TAKE 1 TABLET BY MOUTH TWICE DAILY WITH A MEAL 12/16/23   Hilty, Vinie BROCKS, MD  fluticasone  (FLONASE ) 50 MCG/ACT nasal spray Use 2 spray(s) in each nostril once daily 12/15/23   Frann Mabel Mt, DO  furosemide  (LASIX ) 40 MG tablet Take 1 tablet by mouth once daily 12/15/23   Frann, Mabel Mt, DO  hydrALAZINE  (APRESOLINE ) 25 MG tablet Take 1 tablet by mouth twice daily 12/16/23   Hilty, Vinie BROCKS, MD  meloxicam  (MOBIC ) 15 MG tablet TAKE 1 TABLET BY MOUTH ONCE DAILY AS NEEDED FOR PAIN 12/03/23   Frann Mabel Mt, DO  metFORMIN  (GLUCOPHAGE ) 500 MG tablet Take 1 tablet (500 mg total) by mouth daily with breakfast. 02/07/23   Frann, Mabel Mt, DO  methocarbamol  (ROBAXIN -750) 750 MG tablet Take 1 tablet (750 mg total) by mouth every 6 (six) hours as needed for muscle spasms. 06/05/23   Beuford Anes, MD  Multiple Vitamin (MULITIVITAMIN WITH MINERALS) TABS Take 1 tablet by mouth daily. Omega XL Joint and bone with fish oil    [provider]  potassium gluconate 595 (99 K) MG TABS tablet Take 595 mg by mouth daily. 07/28/17   [provider]  RESTASIS  0.05 % ophthalmic emulsion Place 1 drop into both eyes 3 (three) times a week. 01/10/23   [provider]  sacubitril -valsartan  (ENTRESTO ) 97-103 MG Take 1 tablet by mouth twice daily 12/16/23   Hilty, Vinie BROCKS, MD  spironolactone  (ALDACTONE ) 25 MG tablet Take 1 tablet by mouth once daily 12/16/23    Hilty, Kenneth C, MD  Vitamin D3 (VITAMIN D ) 25 MCG tablet Take 1,000 Units by mouth daily.    [provider]  vitamin E  180 MG (400 UNITS) capsule Take 400 Units by mouth daily.    [provider]    Physical Exam    Vital Signs:  Ronal Jenkins Sierra does not have vital signs available for review today.  Given telephonic nature of communication, physical exam is limited. AAOx3. NAD. Normal affect.  Speech and respirations are unlabored.  Accessory Clinical Findings    None  Assessment & Plan    1.  Preoperative Cardiovascular Risk Assessment:  According to the Revised Cardiac Risk Index (RCRI), her Perioperative Risk of Major Cardiac Event is (%): 0.9. Her Functional Capacity in METs is: 6.27 according to the Duke Activity Status Index (  DASI).Therefore, based on ACC/AHA guidelines, patient would be at acceptable risk for the planned procedure without further cardiovascular testing.  The patient was advised that if she develops new symptoms prior to surgery to contact our office to arrange for a follow-up visit, and she verbalized understanding.  Regarding ASA therapy, we recommend continuation of ASA throughout the perioperative period.  However, if the surgeon feels that cessation of ASA is required in the perioperative period, it may be stopped 5-7 days prior to surgery with a plan to resume it as soon as felt to be feasible from a surgical standpoint in the post-operative period.   A copy of this note will be routed to requesting surgeon.  Time:   Today, I have spent 6 minutes with the patient with telehealth technology discussing medical history, symptoms, and management plan.     Damien JAYSON Braver, NP  03/10/2024, 1:03 PM

## 2024-03-24 ENCOUNTER — Other Ambulatory Visit: Payer: Self-pay | Admitting: Family Medicine

## 2024-03-31 ENCOUNTER — Telehealth: Payer: Self-pay

## 2024-03-31 NOTE — Telephone Encounter (Signed)
 Called pt advised her we did received forms and forms in his bin to sign, Told pt  He will look at them today. Pt stated understand

## 2024-03-31 NOTE — Telephone Encounter (Signed)
 Copied from CRM [id number]. Topic: Clinical - Medication Question >> Mar 31, 2024 12:48 PM Lavanda D wrote: Reason for CRM: Patient would like to speak with Dr. Gena nurse regarding some teeth that her dentist wants to pull. She would like to know if this is something that is approved from Dr. Frann since she has a medication for her bones/does she need a bone density test. Patient said that the dentist faxing over some paperwork as well.

## 2024-06-01 ENCOUNTER — Other Ambulatory Visit: Payer: Self-pay | Admitting: Family Medicine

## 2024-06-02 DIAGNOSIS — M25551 Pain in right hip: Secondary | ICD-10-CM | POA: Diagnosis not present

## 2024-06-02 DIAGNOSIS — M7062 Trochanteric bursitis, left hip: Secondary | ICD-10-CM | POA: Diagnosis not present

## 2024-06-04 ENCOUNTER — Other Ambulatory Visit: Payer: Self-pay | Admitting: Orthopedic Surgery

## 2024-06-04 DIAGNOSIS — M25551 Pain in right hip: Secondary | ICD-10-CM

## 2024-06-08 ENCOUNTER — Other Ambulatory Visit: Payer: Self-pay | Admitting: Family Medicine

## 2024-06-19 ENCOUNTER — Ambulatory Visit
Admission: RE | Admit: 2024-06-19 | Discharge: 2024-06-19 | Disposition: A | Source: Ambulatory Visit | Attending: Orthopedic Surgery | Admitting: Orthopedic Surgery

## 2024-06-19 DIAGNOSIS — M25551 Pain in right hip: Secondary | ICD-10-CM

## 2024-06-19 DIAGNOSIS — S73191A Other sprain of right hip, initial encounter: Secondary | ICD-10-CM | POA: Diagnosis not present

## 2024-07-01 DIAGNOSIS — S73191D Other sprain of right hip, subsequent encounter: Secondary | ICD-10-CM | POA: Diagnosis not present

## 2024-08-11 ENCOUNTER — Ambulatory Visit: Admitting: Family Medicine

## 2024-08-11 ENCOUNTER — Ambulatory Visit: Payer: Self-pay | Admitting: Family Medicine

## 2024-08-11 ENCOUNTER — Encounter: Payer: Self-pay | Admitting: Family Medicine

## 2024-08-11 VITALS — BP 126/82 | HR 100 | Temp 98.0°F | Resp 16 | Ht 66.0 in | Wt 231.0 lb

## 2024-08-11 DIAGNOSIS — E7800 Pure hypercholesterolemia, unspecified: Secondary | ICD-10-CM

## 2024-08-11 DIAGNOSIS — M1A0720 Idiopathic chronic gout, left ankle and foot, without tophus (tophi): Secondary | ICD-10-CM

## 2024-08-11 DIAGNOSIS — E1165 Type 2 diabetes mellitus with hyperglycemia: Secondary | ICD-10-CM

## 2024-08-11 LAB — COMPREHENSIVE METABOLIC PANEL WITH GFR
ALT: 14 U/L (ref 0–35)
AST: 11 U/L (ref 0–37)
Albumin: 4.1 g/dL (ref 3.5–5.2)
Alkaline Phosphatase: 109 U/L (ref 39–117)
BUN: 16 mg/dL (ref 6–23)
CO2: 29 meq/L (ref 19–32)
Calcium: 9.4 mg/dL (ref 8.4–10.5)
Chloride: 108 meq/L (ref 96–112)
Creatinine, Ser: 0.85 mg/dL (ref 0.40–1.20)
GFR: 68.87 mL/min (ref >60.00)
Glucose, Bld: 114 mg/dL — ABNORMAL HIGH (ref 70–99)
Potassium: 3.9 meq/L (ref 3.5–5.1)
Sodium: 145 meq/L (ref 135–145)
Total Bilirubin: 0.3 mg/dL (ref 0.2–1.2)
Total Protein: 6.8 g/dL (ref 6.0–8.3)

## 2024-08-11 LAB — LIPID PANEL
Cholesterol: 98 mg/dL (ref 0–200)
HDL: 45.2 mg/dL (ref >39.00)
LDL Cholesterol: 38 mg/dL (ref 0–99)
NonHDL: 52.53
Total CHOL/HDL Ratio: 2
Triglycerides: 74 mg/dL (ref 0.0–149.0)
VLDL: 14.8 mg/dL (ref 0.0–40.0)

## 2024-08-11 LAB — URIC ACID: Uric Acid, Serum: 5.2 mg/dL (ref 2.4–7.0)

## 2024-08-11 LAB — HEMOGLOBIN A1C: Hgb A1c MFr Bld: 5.8 % (ref 4.6–6.5)

## 2024-08-11 NOTE — Progress Notes (Signed)
 Subjective:   Chief Complaint  Patient presents with   Diabetes    Diabetes    Sharon Yu is a 73 y.o. female here for follow-up of diabetes.   Sharon Yu does not routinely ck her sugars.  Patient does not require insulin.   Medications include: metformin  500 mg/d Diet is pretty healthy.  Exercise: walking  Hyperlipidemia Patient presents for hyperlipidemia follow up. Currently being treated with Lipitor  40 mg/d and compliance with treatment thus far has been good. She denies myalgias. Diet/exercise as above. No CP or SOB.  The patient is not known to have coexisting coronary artery disease.  Gout Taking allopurinol  100 mg/d. Does not remember when her last gout flare was. Compliant, no AE's. Usually affects her L foot.   Past Medical History:  Diagnosis Date   Arthritis    Back pain    buldging disc   CHF (congestive heart failure) (HCC)    Chronic back pain    Diabetes mellitus without complication (HCC)    Hyperlipidemia    takes Crestor daily   Hypertension    takes Maxzide  and Metoprolol  daily along with Ramipril    Hypopotassemia    takes Potassium pill daily   Joint pain    Nonischemic cardiomyopathy (HCC)    a. 07/2017: EF reduced to 25-30%, cath showing normal cors.      Related testing: Retinal exam: Done Pneumovax: done  Objective:  BP 126/82 (BP Location: Left Arm, Patient Position: Sitting)   Pulse 100   Temp 98 F (36.7 C) (Oral)   Resp 16   Ht 5' 6 (1.676 m)   Wt 231 lb (104.8 kg)   SpO2 98%   BMI 37.28 kg/m  General:  Well developed, well nourished, in no apparent distress Lungs:  CTAB, no access msc use Cardio:  RRR, no bruits, no LE edema Psych: Age appropriate judgment and insight  Assessment:   Type 2 diabetes mellitus with hyperglycemia, without long-term current use of insulin (HCC) - Plan: Comprehensive metabolic panel with GFR, Lipid panel, Hemoglobin A1c  Hypercholesterolemia  Chronic gout of left foot, unspecified cause  - Plan: Uric acid   Plan:   Chronic, stable.  Continue metformin  500 mg daily.  Counseled on diet and exercise. Chronic, stable.  Continue Lipitor  40 mg daily. Chronic, stable.  Continue allopurinol  100 mg daily. F/u in 6 mo. The patient voiced understanding and agreement to the plan.  Sharon Mt Bowdle, DO 08/11/24 9:55 AM

## 2024-08-11 NOTE — Patient Instructions (Signed)
 Give us  2-3 business days to get the results of your labs back.   Keep the diet clean and stay active.  Let us  know if you need anything.

## 2024-09-13 ENCOUNTER — Other Ambulatory Visit: Payer: Self-pay | Admitting: Internal Medicine

## 2024-09-20 ENCOUNTER — Other Ambulatory Visit: Payer: Self-pay | Admitting: Family Medicine

## 2024-09-20 ENCOUNTER — Other Ambulatory Visit: Payer: Self-pay | Admitting: Internal Medicine

## 2024-09-22 ENCOUNTER — Telehealth: Payer: Self-pay | Admitting: Internal Medicine

## 2024-09-22 NOTE — Telephone Encounter (Signed)
" °*  STAT* If patient is at the pharmacy, call can be transferred to refill team.   1. Which medications need to be refilled? (please list name of each medication and dose if known)   sacubitril -valsartan  (ENTRESTO ) 97-103 MG    2. Would you like to learn more about the convenience, safety, & potential cost savings by using the [facility]? no   3. Are you open to using the [facility] (Type [facility]. No   4. Which pharmacy/location (including street and city if local pharmacy) is medication to be sent to? [facility] - [place], [place] - [address]      5. Do they need a 30 day or 90 day supply? 90 day  "

## 2024-09-28 NOTE — Telephone Encounter (Signed)
 Refill was sent 09/27/24.

## 2024-10-02 NOTE — Progress Notes (Unsigned)
 "  Cardiology Office Note:    Date:  10/02/2024   ID:  Sharon Yu, Sharon Yu 1951/01/07, MRN [id number]  PCP:  Frann Mabel Mt, DO  Cardiologist:  Vinie JAYSON Maxcy, MD { Click to update primary MD,subspecialty MD or APP then REFRESH:1}    Referring MD: Frann Mabel Mt, DO   Chief Complaint: follow-up of CHF  History of Present Illness:    Sharon Yu is a 74 y.o. female with a history of normal coronaries on cardiac catheterization in 07/2017, non-ischemic cardiomyopathy/ chronic HFreEF with EF as low as 25-30% in 2018 but improved to 60-65% on last Echo in 09/2023, moderate aortic insufficiency, mild mitral regurgitation, ascending aorta aneurysm, hypertension, hyperlipidemia, type 2 diabetes mellitus, and back pain who is followed by Dr. Maxcy and presents today for follow-up of CHF.   Patient was first seen by Cardiology in 07/2017 during an admission for acute CHF. Echo showed LVEF of 25-30% with grade 2 diastolic dysfunction, and no significant valvular disease. R/ LHC after diuresis showed normal coronaries with normal left and right filling pressures and preserved cardiac output. Chest CTA during that admission also showed mild aneurysmal dilatation of the ascending aorta measuring 4.3cm. She was started on GDMT with improvement in her EF.  She was last seen by Dr. Maxcy in 07/2023 at which time she was doing well from a cardiac standpoint. Repeat Echo was ordered to reassess LV function. This was completed in 09/2023 showed  LVEF of 60-65% with grade 1 diastolic dysfunction, normal RV function, mild to moderate AI, and mild MR.  Patient presents today for follow-up. ***  Chronic HFrEF with Recovered EF Non-Ischemic Cardiomyopathy EF as low as 25-30% in 07/2017. LHC at that time showed normal coronaries. EF subsequently normalized with GDMT. Last Echo in 09/2023 showed LVEF 60-65% with grade 1 diastolic dysfunction as well as normal RV function.  -  Euvolemic on exam.  *** - Continue Lasix  40mg  daily.  - Continue Entresto  97-103mg  twice daily.  - Continue Coreg  25mg  twice daily.  - Continue Spironolactone  25mg  daily.  - Continue Hydralazine  25mg  twice daily.  - SGLT2 inhibitor *** - Continue daily weights and sodium/ fluid restrictions.  Aortic Insufficiency Mitral Regurgitation Last Echo in 09/2023 showed mild to moderate AI and mild MR.  - Will repeat Echo for routine monitoring. ***  Ascending Aorta Aneurysm Chest CTA in 07/2017 showed mild aneurysmal dilatation of the ascending aorta measuring 4.3cm. Echo at that time showed normal aortic root. Most recent Echo in 09/2023 also made no mention of aneurysmal ascending aorta.  - Given discrepancy between CTA and Echo in the past, will repeat chest CTA. ***  Hypertension BP *** - Will treat in context of CHF as above.   Hyperlipidemia Lipid panel in 08/2024: Total Cholesterol 98, Triglycerides 74, HDL 45.2, LDL 38.  - Continue Lipitor  40mg  daily.   Type 2 Diabetes Mellitus  Hemoglobin A1c *** - On Metformin .  - Management per PCP.  EKGs/Labs/Other Studies Reviewed:    The following studies were reviewed:  Echocardiogram 07/15/2017: Study Conclusions: - Left ventricle: The cavity size was moderately dilated. Systolic    function was severely reduced. The estimated ejection fraction    was in the range of 25% to 30%. Features are consistent with a    pseudonormal left ventricular filling pattern, with concomitant    abnormal relaxation and increased filling pressure (grade 2    diastolic dysfunction). Doppler parameters are consistent with    high ventricular  filling pressure.  - Aortic valve: Transvalvular velocity was within the normal range.    There was no stenosis. There was mild regurgitation.  - Mitral valve: Transvalvular velocity was within the normal range.    There was no evidence for stenosis. There was trivial    regurgitation.  - Left atrium: The atrium was mildly dilated.   - Right ventricle: The cavity size was normal. Wall thickness was    normal. Systolic function was normal.  - Tricuspid valve: There was no regurgitation.  - Pulmonary arteries: Systolic pressure was within the normal    range. PA peak pressure: 34 mm Hg (S).  - Pericardium, extracardiac: A trivial pericardial effusion was    identified.  _______________  Right/ Left Cardiac Catheterization 07/17/2017: There is severe left ventricular systolic dysfunction. LV end diastolic pressure is normal. The left ventricular ejection fraction is 25-35% by visual estimate. LV end diastolic pressure is normal.   1. Normal coronary anatomy 2. Severe LV dysfuntion. EF 30% 3. Normal LV filling pressures 4. Normal right heart pressures 5. Preserved cardiac output.  Diagnostic Dominance: Right    _______________  Echocardiogram 09/10/2023: Impressions:   1. Left ventricular ejection fraction, by estimation, is 60 to 65%. The  left ventricle has normal function. The left ventricle has no regional  wall motion abnormalities. Left ventricular diastolic parameters are  consistent with Grade I diastolic  dysfunction (impaired relaxation).   2. Right ventricular systolic function is normal. The right ventricular  size is normal. There is normal pulmonary artery systolic pressure.   3. The mitral valve is normal in structure. Mild mitral valve  regurgitation. No evidence of mitral stenosis.   4. The aortic valve was not well visualized. Aortic valve regurgitation  is mild to moderate. No aortic stenosis is present. Aortic regurgitation  PHT measures 550 msec.   5. The inferior vena cava is normal in size with greater than 50%  respiratory variability, suggesting right atrial pressure of 3 mmHg.    EKG:  EKG ordered today. EKG personally reviewed and demonstrates ***.  Recent Labs: 03/02/2024: Hemoglobin 11.4; Platelets 217 08/11/2024: ALT 14; BUN 16; Creatinine, Ser 0.85; Potassium 3.9; Sodium  145  Recent Lipid Panel    Component Value Date/Time   CHOL 98 08/11/2024 0954   TRIG 74.0 08/11/2024 0954   HDL 45.20 08/11/2024 0954   CHOLHDL 2 08/11/2024 0954   VLDL 14.8 08/11/2024 0954   LDLCALC 38 08/11/2024 0954   LDLCALC 58 08/03/2021 1515    Physical Exam:    Vital Signs: There were no vitals taken for this visit.    Wt Readings from Last 3 Encounters:  08/11/24 231 lb (104.8 kg)  03/08/24 233 lb (105.7 kg)  03/01/24 233 lb (105.7 kg)     General: 74 y.o. female in no acute distress. HEENT: Normocephalic and atraumatic. Sclera clear.  Neck: Supple. No carotid bruits. No JVD. Heart: *** RRR. Distinct S1 and S2. No murmurs, gallops, or rubs.  Lungs: No increased work of breathing. Clear to ausculation bilaterally. No wheezes, rhonchi, or rales.  Abdomen: Soft, non-distended, and non-tender to palpation.  Extremities: No lower extremity edema.  Radial and distal pedal pulses 2+ and equal bilaterally. Skin: Warm and dry. Neuro: No focal deficits. Psych: Normal affect. Responds appropriately.   Assessment:    No diagnosis found.  Plan:     Disposition: Follow up in ***   Signed, Daleon Willinger E Tiffani Kadow, PA-C  10/02/2024 5:17 PM    [facility] "

## 2024-10-13 ENCOUNTER — Ambulatory Visit: Admitting: Student

## 2025-02-09 ENCOUNTER — Encounter: Admitting: Family Medicine

## 2025-02-15 ENCOUNTER — Ambulatory Visit
